# Patient Record
Sex: Female | Born: 1990 | Race: Black or African American | Hispanic: No | Marital: Single | Smoking: Never smoker
Health system: Southern US, Community
[De-identification: ages and names within clinical notes are randomized; demographics above are authoritative.]

## PROBLEM LIST (undated history)

## (undated) ENCOUNTER — Inpatient Hospital Stay (HOSPITAL_COMMUNITY): Payer: Self-pay

## (undated) DIAGNOSIS — F419 Anxiety disorder, unspecified: Secondary | ICD-10-CM

## (undated) DIAGNOSIS — M549 Dorsalgia, unspecified: Secondary | ICD-10-CM

## (undated) DIAGNOSIS — R519 Headache, unspecified: Secondary | ICD-10-CM

## (undated) DIAGNOSIS — N920 Excessive and frequent menstruation with regular cycle: Secondary | ICD-10-CM

## (undated) DIAGNOSIS — D251 Intramural leiomyoma of uterus: Secondary | ICD-10-CM

## (undated) DIAGNOSIS — G8929 Other chronic pain: Secondary | ICD-10-CM

## (undated) DIAGNOSIS — R51 Headache: Secondary | ICD-10-CM

## (undated) DIAGNOSIS — N390 Urinary tract infection, site not specified: Secondary | ICD-10-CM

## (undated) DIAGNOSIS — D649 Anemia, unspecified: Secondary | ICD-10-CM

## (undated) DIAGNOSIS — E559 Vitamin D deficiency, unspecified: Secondary | ICD-10-CM

## (undated) HISTORY — PX: NO PAST SURGERIES: SHX2092

## (undated) HISTORY — DX: Anxiety disorder, unspecified: F41.9

## (undated) HISTORY — DX: Dorsalgia, unspecified: M54.9

---
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
## 2011-09-17 ENCOUNTER — Encounter (HOSPITAL_COMMUNITY): Payer: Self-pay

## 2011-09-17 ENCOUNTER — Emergency Department (INDEPENDENT_AMBULATORY_CARE_PROVIDER_SITE_OTHER)
Admission: EM | Admit: 2011-09-17 | Discharge: 2011-09-17 | Disposition: A | Discharge status: Home / Self Care | Payer: Self-pay | Source: Home / Self Care | Attending: Family Medicine | Admitting: Family Medicine

## 2011-09-17 DIAGNOSIS — B360 Pityriasis versicolor: Secondary | ICD-10-CM

## 2011-09-17 MED ORDER — KETOCONAZOLE 2 % EX GEL: 1.0000 "application " | Freq: Every day | CUTANEOUS | Status: DC

## 2011-09-17 NOTE — Discharge Instructions (Signed)
My impression is that you have a yeast infection in your skin. Use Selsun Blue medicated shampoo red top daily at bedtime let's stay in your skin overnight and rinse the next day. Use the prescribed medication daily as instructed. Followup with the dermatologist if persistent or worsening symptoms despite following treatment.

## 2011-09-17 NOTE — ED Notes (Signed)
C/o general rash for 2-3 months

## 2011-09-20 NOTE — ED Provider Notes (Signed)
History     CSN: [id number]  Arrival date & time 09/17/11  1519   First MD Initiated Contact with Patient 09/17/11 1521      Chief Complaint  Patient presents with  . Rash    (Consider location/radiation/quality/duration/timing/severity/associated sxs/prior treatment) HPI Comments: 21 y/o female otherwise healthy here c/o rash for 2 month. Mildly pruriginous, mostly in back anterior torso and upper extremities but few spots in upper thighs. No fever or chills. Normal appetite. No other symptoms.     History reviewed. No pertinent past medical history.  History reviewed. No pertinent past surgical history.  History reviewed. No pertinent family history.  History  Substance Use Topics  . Smoking status: Not on file  . Smokeless tobacco: Not on file  . Alcohol Use: Not on file    OB History    Grav Para Term Preterm Abortions TAB SAB Ect Mult Living                  Review of Systems  Constitutional: Negative for fever, activity change and appetite change.       10 systems reviewed and  pertinent negative and positive symptoms and as per HPI.     HENT: Negative for mouth sores.   Musculoskeletal: Negative for arthralgias.  Skin: Positive for rash.  All other systems reviewed and are negative.    Allergies  Review of patient's allergies indicates no known allergies.  Home Medications   Current Outpatient Rx  Name Route Sig Dispense Refill  . KETOCONAZOLE 2 % EX GEL Apply externally Apply 1 application topically daily. Apply once daily for 14 days 15 g 1    BP 138/85  Pulse 90  Temp(Src) 98.5 F (36.9 C) (Oral)  Resp 18  SpO2 100%  Physical Exam  Nursing note and vitals reviewed. Constitutional: She is oriented to person, place, and time. She appears well-developed and well-nourished. No distress.  HENT:  Head: Normocephalic and atraumatic.  Mouth/Throat: Oropharynx is clear and moist.  Eyes: Conjunctivae are normal.  Neck: Neck supple.    Cardiovascular: Normal heart sounds.   Pulmonary/Chest: Breath sounds normal.  Lymphadenopathy:    She has no cervical adenopathy.    She has no axillary adenopathy.  Neurological: She is alert and oriented to person, place, and time.  Skin:       Hypopigmented peeling small patches scattered in upper back. Neck and upper arms. Few similar lesions in anterior torso and upper thighs. No erythema or swelling of the skin. No ulcers, scabs or pustules    ED Course  Procedures (including critical care time)  Labs Reviewed - No data to display No results found.   1. Pityriasis versicolor       MDM  Treated with selenium sulfide and ketoconazole gel. Supportive care measures also provided.        Sharin Grave, MD 09/20/11 1113

## 2011-09-22 ENCOUNTER — Telehealth (HOSPITAL_COMMUNITY): Payer: Self-pay | Admitting: Family Medicine

## 2011-09-22 NOTE — ED Notes (Signed)
Patient called stating that she has used all of the prescribed cream and is requesting more.  Attempted to call patient but there was no answer

## 2011-10-07 ENCOUNTER — Encounter (HOSPITAL_COMMUNITY): Payer: Self-pay | Admitting: Emergency Medicine

## 2011-10-07 ENCOUNTER — Emergency Department (HOSPITAL_COMMUNITY)
Admission: EM | Admit: 2011-10-07 | Discharge: 2011-10-08 | Disposition: A | Discharge status: Home / Self Care | Payer: Self-pay | Attending: Emergency Medicine | Admitting: Emergency Medicine

## 2011-10-07 DIAGNOSIS — L409 Psoriasis, unspecified: Secondary | ICD-10-CM

## 2011-10-07 DIAGNOSIS — R21 Rash and other nonspecific skin eruption: Secondary | ICD-10-CM

## 2011-10-07 NOTE — ED Notes (Signed)
Pt alert, nad, c/o rash, onset several months ago, denies recent exposures, resp even unlabored, skin pwd

## 2011-10-08 MED ORDER — CALCIPOTRIENE 0.005 % EX CREA: TOPICAL_CREAM | Freq: Two times a day (BID) | CUTANEOUS | Status: DC

## 2011-10-08 MED ORDER — TRIAMCINOLONE ACETONIDE 0.1 % EX CREA: TOPICAL_CREAM | Freq: Three times a day (TID) | CUTANEOUS | Status: DC

## 2011-10-08 NOTE — ED Provider Notes (Signed)
History     CSN: [id number]  Arrival date & time 10/07/11  2335   First MD Initiated Contact with Patient 10/07/11 2354      Chief Complaint  Patient presents with  . Rash    (Consider location/radiation/quality/duration/timing/severity/associated sxs/prior treatment) HPI Comments: Patient with history of eczema presents emergency department with chief complaint of rash that has been occurring for 3-4 months.  Patient was treated by urgent care for pityriasis versicolor with selenium sulfide and ketoconazole gel.  Patient states her rash has not improved at all and actually believes it to be getting worse.  She denies any fevers, night sweats, chills purulent draining, erythema.  No other concerns at this time.  Patient is a 21 y.o. female presenting with rash. The history is provided by the patient.  Rash     History reviewed. No pertinent past medical history.  History reviewed. No pertinent past surgical history.  No family history on file.  History  Substance Use Topics  . Smoking status: Never Smoker   . Smokeless tobacco: Not on file  . Alcohol Use: No    OB History    Grav Para Term Preterm Abortions TAB SAB Ect Mult Living                  Review of Systems  Constitutional: Negative for fever, chills and appetite change.  HENT: Negative for congestion.   Eyes: Negative for visual disturbance.  Respiratory: Negative for shortness of breath.   Cardiovascular: Negative for chest pain and leg swelling.  Gastrointestinal: Negative for abdominal pain.  Genitourinary: Negative for dysuria, urgency and frequency.  Skin: Positive for rash.  Neurological: Negative for dizziness, syncope, weakness, light-headedness, numbness and headaches.  Psychiatric/Behavioral: Negative for confusion.  All other systems reviewed and are negative.    Allergies  Review of patient's allergies indicates no known allergies.  Home Medications   Current Outpatient Rx  Name Route  Sig Dispense Refill  . KETOCONAZOLE 2 % EX GEL Apply externally Apply 1 application topically daily. Apply once daily for 14 days 15 g 1  . OVER THE COUNTER MEDICATION Topical Apply 1 application topically daily. OTC. Selsum Blue    . TEA TREE OIL OIL Topical Apply 1 application topically daily. Applied to entire body      BP 125/89  Pulse 89  Temp 98 F (36.7 C)  Resp 16  Wt 130 lb (58.968 kg)  SpO2 99%  LMP 10/03/2011  Physical Exam  Nursing note and vitals reviewed. Constitutional: She is oriented to person, place, and time. She appears well-developed and well-nourished. No distress.  HENT:  Head: Normocephalic and atraumatic.  Eyes: Conjunctivae and EOM are normal.  Neck: Normal range of motion.  Pulmonary/Chest: Effort normal.  Musculoskeletal: Normal range of motion.  Neurological: She is alert and oriented to person, place, and time.  Skin: Skin is warm and dry. No rash noted. She is not diaphoretic.       Hyperpigmented skin plaques located on the flexor surfaces groin, breast, and axilla.  Non-scaling smooth inflamed circular lesions located on truncal area anteriorly and posteriorly as well as M. these bilaterally.  Psychiatric: She has a normal mood and affect. Her behavior is normal.    ED Course  Procedures (including critical care time)  Labs Reviewed - No data to display No results found.   No diagnosis found.    MDM  Rash   Patient has failed treatment for pityriasis versicolor.  Question possible psoriasis  with history of eczema.  Discussed thoroughly with patient to stop treatment immediately if rash does not appear to be improving and to followup with dermatologist this week.  Return precautions discussed.  Patient has been hemodynamically stable in no acute distress throughout visit.        Jaci Carrel, [place] 10/08/11 [phone]

## 2011-10-08 NOTE — ED Provider Notes (Signed)
Medical screening examination/treatment/procedure(s) were performed by non-physician practitioner and as supervising physician I was immediately available for consultation/collaboration.  Juandavid Dallman, MD 10/08/11 0914 

## 2012-09-20 ENCOUNTER — Encounter (HOSPITAL_COMMUNITY): Payer: Self-pay | Admitting: Emergency Medicine

## 2012-09-20 ENCOUNTER — Emergency Department (HOSPITAL_COMMUNITY)
Admission: EM | Admit: 2012-09-20 | Discharge: 2012-09-20 | Disposition: A | Discharge status: Home / Self Care | Payer: Self-pay | Attending: Emergency Medicine | Admitting: Emergency Medicine

## 2012-09-20 DIAGNOSIS — Z3202 Encounter for pregnancy test, result negative: Secondary | ICD-10-CM | POA: Insufficient documentation

## 2012-09-20 DIAGNOSIS — R319 Hematuria, unspecified: Secondary | ICD-10-CM | POA: Insufficient documentation

## 2012-09-20 DIAGNOSIS — N390 Urinary tract infection, site not specified: Secondary | ICD-10-CM | POA: Insufficient documentation

## 2012-09-20 LAB — URINALYSIS, ROUTINE W REFLEX MICROSCOPIC
Bilirubin Urine: NEGATIVE
Glucose, UA: NEGATIVE mg/dL
Ketones, ur: NEGATIVE mg/dL
Nitrite: NEGATIVE
Protein, ur: 100 mg/dL — AB
Specific Gravity, Urine: 1.025 (ref 1.005–1.030)
Urobilinogen, UA: 1 mg/dL (ref 0.0–1.0)
pH: 7 (ref 5.0–8.0)

## 2012-09-20 LAB — URINE MICROSCOPIC-ADD ON

## 2012-09-20 LAB — PREGNANCY, URINE: Preg Test, Ur: NEGATIVE

## 2012-09-20 MED ORDER — NITROFURANTOIN MONOHYD MACRO 100 MG PO CAPS: 100.0000 mg | ORAL_CAPSULE | Freq: Two times a day (BID) | ORAL | Status: DC

## 2012-09-20 NOTE — ED Notes (Signed)
Pt c/o painful urination x 1 days. Denies vaginal discharge but states she did she drops of blood.

## 2012-09-20 NOTE — ED Notes (Signed)
Bed:WA08<BR> Expected date:<BR> Expected time:<BR> Means of arrival:<BR> Comments:<BR>

## 2012-09-22 LAB — URINE CULTURE: Colony Count: >=100000

## 2012-09-23 ENCOUNTER — Telehealth (HOSPITAL_COMMUNITY): Payer: Self-pay | Admitting: Emergency Medicine

## 2012-09-23 NOTE — Progress Notes (Signed)
ED Antimicrobial Stewardship Positive Culture Follow Up   Paige Fritz is an 22 y.o. female who presented to [facility] on 09/20/2012 with a chief complaint of  Chief Complaint  Patient presents with  .      painful urination    Recent Results (from the past 720 hour(s))  URINE CULTURE     Status: None   Collection Time    09/20/12  8:50 AM      Result Value Range Status   Specimen Description URINE, CLEAN CATCH   Final   Special Requests NONE   Final   Culture  Setup Time 09/20/2012 13:23   Final   Colony Count >=100,000 COLONIES/ML   Final   Culture PROTEUS MIRABILIS   Final   Report Status 09/22/2012 FINAL   Final   Organism ID, Bacteria PROTEUS MIRABILIS   Final    [x]  Treated with nitrofurantoin, organism resistant to prescribed antimicrobial []  Patient discharged originally without antimicrobial agent and treatment is now indicated  New antibiotic prescription: Bactrim DS 1 tablet BID x 3 days  ED Provider: Magnus Sinning, PA-C   Mickeal Skinner 09/23/2012, 3:57 PM Infectious Diseases Pharmacist Phone# [phone]

## 2012-09-23 NOTE — ED Provider Notes (Signed)
History    22yF with dysuria. Onset yesterday. Today noticed hematuria. No abdominal or back pain. No fever or chills. No n/v. No unusual vaginal bleeding or discharge. No intervention prior to arrival.  CSN: [id number]  Arrival date & time 09/20/12  0716   First MD Initiated Contact with Patient 09/20/12 0740      Chief Complaint  Patient presents with  .      painful urination    (Consider location/radiation/quality/duration/timing/severity/associated sxs/prior treatment) HPI  History reviewed. No pertinent past medical history.  History reviewed. No pertinent past surgical history.  No family history on file.  History  Substance Use Topics  . Smoking status: Never Smoker   . Smokeless tobacco: Not on file  . Alcohol Use: Yes    OB History   Grav Para Term Preterm Abortions TAB SAB Ect Mult Living                  Review of Systems  All systems reviewed and negative, other than as noted in HPI.   Allergies  Review of patient's allergies indicates no known allergies.  Home Medications   Current Outpatient Rx  Name  Route  Sig  Dispense  Refill  . ibuprofen (ADVIL,MOTRIN) 200 MG tablet   Oral   Take 400 mg by mouth every 6 (six) hours as needed for pain.         . nitrofurantoin, macrocrystal-monohydrate, (MACROBID) 100 MG capsule   Oral   Take 1 capsule (100 mg total) by mouth 2 (two) times daily.   10 capsule   0     BP 112/72  Pulse 74  Temp(Src) 98.5 F (36.9 C) (Oral)  Resp 18  SpO2 100%  LMP 09/10/2012  Physical Exam  Nursing note and vitals reviewed. Constitutional: She appears well-developed and well-nourished. No distress.  HENT:  Head: Normocephalic and atraumatic.  Eyes: Conjunctivae are normal. Right eye exhibits no discharge. Left eye exhibits no discharge.  Neck: Neck supple.  Cardiovascular: Normal rate, regular rhythm and normal heart sounds.  Exam reveals no gallop and no friction rub.   No murmur  heard. Pulmonary/Chest: Effort normal and breath sounds normal. No respiratory distress.  Abdominal: Soft. She exhibits no distension. There is no tenderness.  Genitourinary:  No cva tenderness  Musculoskeletal: She exhibits no edema and no tenderness.  Neurological: She is alert.  Skin: Skin is warm and dry.  Psychiatric: She has a normal mood and affect. Her behavior is normal. Thought content normal.    ED Course  Procedures (including critical care time)  Labs Reviewed  URINALYSIS, ROUTINE W REFLEX MICROSCOPIC - Abnormal; Notable for the following:    Color, Urine RED (*)    APPearance TURBID (*)    Hgb urine dipstick LARGE (*)    Protein, ur 100 (*)    Leukocytes, UA MODERATE (*)    All other components within normal limits  URINE MICROSCOPIC-ADD ON - Abnormal; Notable for the following:    Bacteria, UA MANY (*)    All other components within normal limits  URINE CULTURE  PREGNANCY, URINE   No results found.   1. UTI (urinary tract infection)       MDM  22 year old female with dysuria and hematuria. Urinalysis consistent with urinary tract infection. Feel she is appropriate for outpatient treatment. Prescription provided. Return precautions discussed.         Raeford Razor, MD 09/23/12 [phone]

## 2012-09-23 NOTE — ED Notes (Signed)
Post ED Visit - Positive Culture Follow-up: Successful Patient Follow-Up  Culture assessed and recommendations reviewed by: []  Wes Dulaney, Pharm.D., BCPS [x]  Celedonio Miyamoto, Pharm.D., BCPS []  Georgina Pillion, Pharm.D., BCPS []  [employer], [address].D., BCPS, AAHIVP []  Estella Husk, Pharm.D., BCPS, AAHIVP  Positive urine culture  []  Patient discharged without antimicrobial prescription and treatment is now indicated [x]  Organism is resistant to prescribed ED discharge antimicrobial []  Patient with positive blood cultures  Changes discussed with ED provider: Pascal Lux Wingen PA-C New antibiotic prescription: Bactrim DS 1 tablet BID x 3 days. #6Elwanda [place] 09/23/2012, 10:05 AM

## 2012-09-26 ENCOUNTER — Telehealth (HOSPITAL_COMMUNITY): Payer: Self-pay | Admitting: Emergency Medicine

## 2012-09-27 ENCOUNTER — Telehealth (HOSPITAL_COMMUNITY): Payer: Self-pay | Admitting: Emergency Medicine

## 2012-09-27 NOTE — ED Notes (Signed)
Unable to contact patient via phone. Sent letter. °

## 2012-10-01 NOTE — ED Notes (Addendum)
Patient called and stated that she now feels fine.Refused rx.

## 2012-10-28 ENCOUNTER — Emergency Department (HOSPITAL_COMMUNITY)
Admission: EM | Admit: 2012-10-28 | Discharge: 2012-10-28 | Disposition: A | Discharge status: Home / Self Care | Payer: Self-pay | Attending: Emergency Medicine | Admitting: Emergency Medicine

## 2012-10-28 DIAGNOSIS — IMO0001 Reserved for inherently not codable concepts without codable children: Secondary | ICD-10-CM | POA: Insufficient documentation

## 2012-10-28 DIAGNOSIS — M7989 Other specified soft tissue disorders: Secondary | ICD-10-CM | POA: Insufficient documentation

## 2012-10-28 MED ORDER — HYDROCODONE-ACETAMINOPHEN 5-325 MG PO TABS: 2.0000 | ORAL_TABLET | Freq: Four times a day (QID) | ORAL | Status: DC | PRN

## 2012-10-28 MED ORDER — PROMETHAZINE HCL 25 MG PO TABS: 25.0000 mg | ORAL_TABLET | Freq: Four times a day (QID) | ORAL | Status: DC | PRN

## 2012-10-28 MED ORDER — ACETAMINOPHEN 500 MG PO TABS
1000.0000 mg | ORAL_TABLET | Freq: Once | ORAL | Status: AC
  Administered 2012-10-28: 1000 mg via ORAL
  Filled 2012-10-28: qty 2

## 2012-10-28 NOTE — ED Notes (Signed)
Pt states that she has had pain in her L leg behind the knee area x 1 wk and now she has noticed some swelling in her L foot.

## 2012-10-28 NOTE — ED Provider Notes (Signed)
History  This chart was scribed for non-physician practitioner working with Flint Melter, MD by Greggory Stallion, ED scribe. This patient was seen in room WTR7/WTR7 and the patient's care was started at 7:42 PM.  CSN: [id number] Arrival date & time 10/28/12  1934    Chief Complaint  Patient presents with  . Leg Pain    The history is provided by the patient. No language interpreter was used.    HPI Comments: Paige Fritz is a 23 y.o. female who presents to the Emergency Department complaining of left leg pain that started 1 week ago with associated swelling down to her foot that started 2 days ago. Pt states the pain is the worst behind her left knee and on her lower leg. She states this has never happened before. Pt denies trauma or injury to her leg. Pt states the pain is worse when she is standing for long periods of time. 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 Pt states it does not hurt to breathe in. She states she has not taken anything for pain. Pt states she has never had a blood clot in her leg or lungs. No recent trips or surgeries. She is not on birth control.   No past medical history on file. No past surgical history on file. No family history on file. History  Substance Use Topics  . Smoking status: Never Smoker   . Smokeless tobacco: Not on file  . Alcohol Use: Yes   OB History   Grav Para Term Preterm Abortions TAB SAB Ect Mult Living                 Review of Systems  Musculoskeletal: Positive for myalgias.  All other systems reviewed and are negative.    Allergies  Review of patient's allergies indicates no known allergies.  Home Medications   Current Outpatient Rx  Name  Route  Sig  Dispense  Refill  . ibuprofen (ADVIL,MOTRIN) 200 MG tablet   Oral   Take 400 mg by mouth every 6 (six) hours as needed for pain.         . nitrofurantoin, macrocrystal-monohydrate, (MACROBID) 100 MG capsule   Oral   Take 1 capsule (100 mg  total) by mouth 2 (two) times daily.   10 capsule   0    BP 123/106  Pulse 100  Temp(Src) 98.7 F (37.1 C) (Oral)  SpO2 100%  LMP 09/27/2012  Physical Exam  Nursing note and vitals reviewed. Constitutional: She is oriented to person, place, and time. She appears well-developed and well-nourished. No distress.  HENT:  Head: Normocephalic and atraumatic.  Right Ear: External ear normal.  Left Ear: External ear normal.  Nose: Nose normal.  Mouth/Throat: Oropharynx is clear and moist.  Eyes: Conjunctivae are normal.  Neck: Normal range of motion.  Cardiovascular: Normal rate, regular rhythm and normal heart sounds.   Homan's sign positive.  Pulmonary/Chest: Effort normal and breath sounds normal. No stridor. No respiratory distress. She has no wheezes. She has no rales.  Abdominal: Soft. She exhibits no distension.  Musculoskeletal: Normal range of motion.  Tender to palpation over medial aspect of her posterior right knee.   Neurological: She is alert and oriented to person, place, and time. She has normal strength.  Skin: Skin is warm and dry. She is not diaphoretic. No erythema.  Psychiatric: She has a normal mood and affect. Her behavior is normal.    ED Course  Procedures (including critical care time)  DIAGNOSTIC STUDIES: Oxygen Saturation is 100% on RA, normal by my interpretation.    COORDINATION OF CARE: 8:30 PM-Discussed treatment plan with pt at bedside and pt agreed to plan.   Labs Reviewed - No data to display No results found. 1. Leg swelling     MDM  Vascular US tomorrow morning to r/o DVT. My suspicion she has a DVT is very low and I do not feel as though I need to anticoagulate her today. Return instructions given. Vital signs stable for discharge. Patient / Family / Caregiver informed of clinical course, understand medical decision-making process, and agree with plan.        I personally performed the services described in this documentation,  which was scribed in my presence. The recorded information has been reviewed and is accurate.    Mora Bellman, PA-C 10/29/12 1539

## 2012-10-29 ENCOUNTER — Ambulatory Visit (HOSPITAL_COMMUNITY)
Admission: RE | Admit: 2012-10-29 | Discharge: 2012-10-29 | Disposition: A | Discharge status: Home / Self Care | Payer: Self-pay | Source: Ambulatory Visit | Attending: Emergency Medicine | Admitting: Emergency Medicine

## 2012-10-29 ENCOUNTER — Other Ambulatory Visit (HOSPITAL_COMMUNITY): Payer: Self-pay | Admitting: Physician Assistant

## 2012-10-29 DIAGNOSIS — M25562 Pain in left knee: Secondary | ICD-10-CM

## 2012-10-29 DIAGNOSIS — M79609 Pain in unspecified limb: Secondary | ICD-10-CM | POA: Insufficient documentation

## 2012-10-29 DIAGNOSIS — M7989 Other specified soft tissue disorders: Secondary | ICD-10-CM

## 2012-10-29 NOTE — Progress Notes (Signed)
*  Preliminary Results* Left lower extremity venous duplex completed. Left lower extremity is negative for deep vein thrombosis. There is evidence of a left Baker's cyst.  10/29/2012 10:51 AM Gertie Fey, RVT, RDCS, RDMS

## 2012-10-30 NOTE — ED Provider Notes (Signed)
Medical screening examination/treatment/procedure(s) were performed by non-physician practitioner and as supervising physician I was immediately available for consultation/collaboration.  Dao Memmott L Keyri Salberg, MD 10/30/12 0742 

## 2013-05-07 DIAGNOSIS — D500 Iron deficiency anemia secondary to blood loss (chronic): Secondary | ICD-10-CM

## 2013-05-07 HISTORY — DX: Iron deficiency anemia secondary to blood loss (chronic): D50.0

## 2013-07-26 ENCOUNTER — Emergency Department (HOSPITAL_COMMUNITY): Payer: Medicaid Other

## 2013-07-26 ENCOUNTER — Encounter (HOSPITAL_COMMUNITY): Payer: Self-pay | Admitting: Emergency Medicine

## 2013-07-26 ENCOUNTER — Emergency Department (HOSPITAL_COMMUNITY)
Admission: EM | Admit: 2013-07-26 | Discharge: 2013-07-26 | Disposition: A | Discharge status: Home / Self Care | Payer: Medicaid Other | Attending: Emergency Medicine | Admitting: Emergency Medicine

## 2013-07-26 DIAGNOSIS — Z3201 Encounter for pregnancy test, result positive: Secondary | ICD-10-CM | POA: Insufficient documentation

## 2013-07-26 DIAGNOSIS — O2390 Unspecified genitourinary tract infection in pregnancy, unspecified trimester: Secondary | ICD-10-CM | POA: Insufficient documentation

## 2013-07-26 DIAGNOSIS — Z3490 Encounter for supervision of normal pregnancy, unspecified, unspecified trimester: Secondary | ICD-10-CM

## 2013-07-26 DIAGNOSIS — N390 Urinary tract infection, site not specified: Secondary | ICD-10-CM | POA: Insufficient documentation

## 2013-07-26 HISTORY — DX: Urinary tract infection, site not specified: N39.0

## 2013-07-26 LAB — URINALYSIS, ROUTINE W REFLEX MICROSCOPIC
Bilirubin Urine: NEGATIVE
GLUCOSE, UA: NEGATIVE mg/dL
Ketones, ur: NEGATIVE mg/dL
Nitrite: NEGATIVE
PH: 5.5 (ref 5.0–8.0)
PROTEIN: 100 mg/dL — AB
SPECIFIC GRAVITY, URINE: 1.017 (ref 1.005–1.030)
Urobilinogen, UA: 0.2 mg/dL (ref 0.0–1.0)

## 2013-07-26 LAB — URINE MICROSCOPIC-ADD ON

## 2013-07-26 LAB — PREGNANCY, URINE: PREG TEST UR: POSITIVE — AB

## 2013-07-26 LAB — HCG, QUANTITATIVE, PREGNANCY: HCG, BETA CHAIN, QUANT, S: 209 m[IU]/mL — AB (ref <5)

## 2013-07-26 MED ORDER — NITROFURANTOIN MONOHYD MACRO 100 MG PO CAPS: 100.0000 mg | ORAL_CAPSULE | Freq: Two times a day (BID) | ORAL | Status: DC

## 2013-07-26 NOTE — ED Notes (Signed)
Patient transported to Ultrasound 

## 2013-07-26 NOTE — ED Notes (Signed)
Pt c/o lower abdominal pain onset Thursday. Pt reports having diarrhea and having nausea. Pt denies vaginal discharge. Pt reports history of UTI and had similar symptoms.

## 2013-07-26 NOTE — ED Notes (Signed)
MD at bedside. 

## 2013-07-26 NOTE — Discharge Instructions (Signed)
Pregnancy and Urinary Tract Infection °A urinary tract infection (UTI) is a bacterial infection of the urinary tract. Infection of the urinary tract can include the ureters, kidneys (pyelonephritis), bladder (cystitis), and urethra (urethritis). All pregnant women should be screened for bacteria in the urinary tract. Identifying and treating a UTI will decrease the risk of preterm labor and developing more serious infections in both the mother and baby. °CAUSES °Bacteria germs cause almost all UTIs.  °RISK FACTORS °Many factors can increase your chances of getting a UTI during pregnancy. These include: °· Having a short urethra. °· Poor toilet and hygiene habits. °· Sexual intercourse. °· Blockage of urine along the urinary tract. °· Problems with the pelvic muscles or nerves. °· Diabetes. °· Obesity. °· Bladder problems after having several children. °· Previous history of UTI. °SIGNS AND SYMPTOMS  °· Pain, burning, or a stinging feeling when urinating. °· Suddenly feeling the need to urinate right away (urgency). °· Loss of bladder control (urinary incontinence). °· Frequent urination, more than is common with pregnancy. °· Lower abdominal or back discomfort. °· Cloudy urine. °· Blood in the urine (hematuria). °· Fever.  °When the kidneys are infected, the symptoms may be: °· Back pain. °· Flank pain on the right side more so than the left. °· Fever. °· Chills. °· Nausea. °· Vomiting. °DIAGNOSIS  °A urinary tract infection is usually diagnosed through urine tests. Additional tests and procedures are sometimes done. These may include: °· Ultrasound exam of the kidneys, ureters, bladder, and urethra. °· Looking in the bladder with a lighted tube (cystoscopy). °TREATMENT °Typically, UTIs can be treated with antibiotic medicines.  °HOME CARE INSTRUCTIONS  °· Only take over-the-counter or prescription medicines as directed by your health care provider. If you were prescribed antibiotics, take them as directed. Finish  them even if you start to feel better. °· Drink enough fluids to keep your urine clear or pale yellow. °· Do not have sexual intercourse until the infection is gone and your health care provider says it is okay. °· Make sure you are tested for UTIs throughout your pregnancy. These infections often come back.  °Preventing a UTI in the Future °· Practice good toilet habits. Always wipe from front to back. Use the tissue only once. °· Do not hold your urine. Empty your bladder as soon as possible when the urge comes. °· Do not douche or use deodorant sprays. °· Wash with soap and warm water around the genital area and the anus. °· Empty your bladder before and after sexual intercourse. °· Wear underwear with a cotton crotch. °· Avoid caffeine and carbonated drinks. They can irritate the bladder. °· Drink cranberry juice or take cranberry pills. This may decrease the risk of getting a UTI. °· Do not drink alcohol. °· Keep all your appointments and tests as scheduled.  °SEEK MEDICAL CARE IF:  °· Your symptoms get worse. °· You are still having fevers 2 or more days after treatment begins. °· You have a rash. °· You feel that you are having problems with medicines prescribed. °· You have abnormal vaginal discharge. °SEEK IMMEDIATE MEDICAL CARE IF:  °· You have back or flank pain. °· You have chills. °· You have blood in your urine. °· You have nausea and vomiting. °· You have contractions of your uterus. °· You have a gush of fluid from the vagina. °MAKE SURE YOU: °· Understand these instructions.   °· Will watch your condition.   °· Will get help right away if you are not doing   pain.  · You have chills.  · You have blood in your urine.  · You have nausea and vomiting.  · You have contractions of your uterus.  · You have a gush of fluid from the vagina.  MAKE SURE YOU:  · Understand these instructions.    · Will watch your condition.    · Will get help right away if you are not doing well or get worse.    Document Released: 08/18/2010 Document Revised: 02/11/2013 Document Reviewed: 11/20/2012  [employer]® Patient Information ©2014 [employer].

## 2013-07-26 NOTE — ED Provider Notes (Signed)
CSN: [id number]     Arrival date & time 07/26/13  1604 History   First MD Initiated Contact with Patient 07/26/13 1627     Chief Complaint  Patient presents with  . Abdominal Pain     (Consider location/radiation/quality/duration/timing/severity/associated sxs/prior Treatment) HPI Comments: Patient presents with lower abdominal pain. She states she's had a history of UTIs in the past and this feels similar. She's had some discomfort in her suprapubic area for the last 3 days. It's been constant throbbing pain. She noticed a dark color to her urine but hasn't had any burning on urination urinary frequency or hematuria. She denies any back pain. There's no nausea vomiting or fevers. She denies any vaginal bleeding or discharge.  Patient is a 23 y.o. female presenting with abdominal pain.  Abdominal Pain Associated symptoms: no chest pain, no chills, no cough, no diarrhea, no fatigue, no fever, no hematuria, no nausea, no shortness of breath and no vomiting     Past Medical History  Diagnosis Date  . UTI (urinary tract infection)    History reviewed. No pertinent past surgical history. No family history on file. History  Substance Use Topics  . Smoking status: Never Smoker   . Smokeless tobacco: Not on file  . Alcohol Use: Yes   OB History   Grav Para Term Preterm Abortions TAB SAB Ect Mult Living                 Review of Systems  Constitutional: Negative for fever, chills, diaphoresis and fatigue.  HENT: Negative for congestion, rhinorrhea and sneezing.   Eyes: Negative.   Respiratory: Negative for cough, chest tightness and shortness of breath.   Cardiovascular: Negative for chest pain and leg swelling.  Gastrointestinal: Positive for abdominal pain. Negative for nausea, vomiting, diarrhea and blood in stool.  Genitourinary: Negative for frequency, hematuria, flank pain and difficulty urinating.  Musculoskeletal: Negative for arthralgias and back pain.  Skin: Negative for  rash.  Neurological: Negative for dizziness, speech difficulty, weakness, numbness and headaches.      Allergies  Review of patient's allergies indicates no known allergies.  Home Medications   Current Outpatient Rx  Name  Route  Sig  Dispense  Refill  . nitrofurantoin, macrocrystal-monohydrate, (MACROBID) 100 MG capsule   Oral   Take 1 capsule (100 mg total) by mouth 2 (two) times daily. X 7 days   14 capsule   0    BP 139/84  Pulse 118  Temp(Src) 98.1 F (36.7 C) (Oral)  Resp 16  Ht 5\' 1"  (1.549 m)  Wt 145 lb (65.772 kg)  BMI 27.41 kg/m2  SpO2 100%  LMP 07/02/2013 Physical Exam  Constitutional: She is oriented to person, place, and time. She appears well-developed and well-nourished.  HENT:  Head: Normocephalic and atraumatic.  Eyes: Pupils are equal, round, and reactive to light.  Neck: Normal range of motion. Neck supple.  Cardiovascular: Normal rate, regular rhythm and normal heart sounds.   Pulmonary/Chest: Effort normal and breath sounds normal. No respiratory distress. She has no wheezes. She has no rales. She exhibits no tenderness.  Abdominal: Soft. Bowel sounds are normal. There is tenderness (mild suprapubic tenderness). There is no rebound and no guarding.  No CVA tenderness  Musculoskeletal: Normal range of motion. She exhibits no edema.  Lymphadenopathy:    She has no cervical adenopathy.  Neurological: She is alert and oriented to person, place, and time.  Skin: Skin is warm and dry. No rash noted.  Psychiatric:  She has a normal mood and affect.    ED Course  Procedures (including critical care time) Labs Review Results for orders placed during the hospital encounter of 07/26/13  URINALYSIS, ROUTINE W REFLEX MICROSCOPIC      Result Value Ref Range   Color, Urine YELLOW  YELLOW   APPearance CLOUDY (*) CLEAR   Specific Gravity, Urine 1.017  1.005 - 1.030   pH 5.5  5.0 - 8.0   Glucose, UA NEGATIVE  NEGATIVE mg/dL   Hgb urine dipstick LARGE (*)  NEGATIVE   Bilirubin Urine NEGATIVE  NEGATIVE   Ketones, ur NEGATIVE  NEGATIVE mg/dL   Protein, ur 100 (*) NEGATIVE mg/dL   Urobilinogen, UA 0.2  0.0 - 1.0 mg/dL   Nitrite NEGATIVE  NEGATIVE   Leukocytes, UA LARGE (*) NEGATIVE  PREGNANCY, URINE      Result Value Ref Range   Preg Test, Ur POSITIVE (*) NEGATIVE  URINE MICROSCOPIC-ADD ON      Result Value Ref Range   Squamous Epithelial / LPF FEW (*) RARE   WBC, UA TOO NUMEROUS TO COUNT  <3 WBC/hpf   RBC / HPF 7-10  <3 RBC/hpf   Bacteria, UA MANY (*) RARE  HCG, QUANTITATIVE, PREGNANCY      Result Value Ref Range   hCG, Beta Chain, Quant, S 209 (*) <5 mIU/mL   US Ob Transvaginal  07/26/2013   CLINICAL DATA:  Abdominal pain  EXAM: OBSTETRIC <14 WK [place] AND TRANSVAGINAL OB US  TECHNIQUE: Both transabdominal and transvaginal ultrasound examinations were performed for complete evaluation of the gestation as well as the maternal uterus, adnexal regions, and pelvic cul-de-sac. Transvaginal technique was performed to assess early pregnancy.  COMPARISON:  None.  FINDINGS: Intrauterine gestational sac: Not present.  Yolk sac:  None.  Embryo:  None.  Cardiac Activity: None.  Heart Rate:  None.  Maternal uterus/adnexae: The uterus is normal in size and echogenicity. The endometrium measures 17 mm. There are multiple hypoechoic uterine masses with the largest measuring 2.1 x 1.8 x 1.1 cm anteriorly most consistent with uterine fibroids.  The right ovary measures 3.2 x 1.5 x 2.1 cm. There is no right ovarian mass.  The left ovary measures 3.4 x 2.7 x 3.1 cm. There is a 2.2 cm anechoic mass in the left ovary without internal Doppler flow likely representing a cyst.  There is a trace amount of pelvic free fluid.  IMPRESSION: No intrauterine pregnancy is identified. No beta HCG is available at this time, but urine pregnancy was positive. Differential diagnosis includes a pregnancy too early to detect versus missed abortion versus ectopic pregnancy. Recommend clinical  correlation, serial quantitative beta HCGs, ectopic precautions, and followup ultrasound as clinically indicated.   Electronically Signed   By: Kathreen Devoid   On: 07/26/2013 19:32     Imaging Review US Ob Transvaginal  07/26/2013   CLINICAL DATA:  Abdominal pain  EXAM: OBSTETRIC <14 WK [place] AND TRANSVAGINAL OB US  TECHNIQUE: Both transabdominal and transvaginal ultrasound examinations were performed for complete evaluation of the gestation as well as the maternal uterus, adnexal regions, and pelvic cul-de-sac. Transvaginal technique was performed to assess early pregnancy.  COMPARISON:  None.  FINDINGS: Intrauterine gestational sac: Not present.  Yolk sac:  None.  Embryo:  None.  Cardiac Activity: None.  Heart Rate:  None.  Maternal uterus/adnexae: The uterus is normal in size and echogenicity. The endometrium measures 17 mm. There are multiple hypoechoic uterine masses with the largest measuring 2.1  x 1.8 x 1.1 cm anteriorly most consistent with uterine fibroids.  The right ovary measures 3.2 x 1.5 x 2.1 cm. There is no right ovarian mass.  The left ovary measures 3.4 x 2.7 x 3.1 cm. There is a 2.2 cm anechoic mass in the left ovary without internal Doppler flow likely representing a cyst.  There is a trace amount of pelvic free fluid.  IMPRESSION: No intrauterine pregnancy is identified. No beta HCG is available at this time, but urine pregnancy was positive. Differential diagnosis includes a pregnancy too early to detect versus missed abortion versus ectopic pregnancy. Recommend clinical correlation, serial quantitative beta HCGs, ectopic precautions, and followup ultrasound as clinically indicated.   Electronically Signed   By: Kathreen Devoid   On: 07/26/2013 19:32     EKG Interpretation None      MDM   Final diagnoses:  Pregnancy  UTI (lower urinary tract infection)    Patient presents with lower abdominal pain. Is primarily suprapubic that she did have some mild left lower quadrant tenderness.  I feel this is likely related to her urinary tract infection. However given that she had a positive pregnancy test I did do a pelvic ultrasound which did not show evidence of intrauterine pregnancy. Her Quant hCG was very low and that is likely the reason no intrauterine pregnancy was not visualized. I did give her instructions to followup at the [facility] hospital in 2 days for a repeat Quant. I gave her prescription for Macrobid for UTI and urine culture was sent. I gave her strict return precautions to return to the [facility] hospital if she has worsening pain, heavy vaginal bleeding or other worsening symptoms.    Malvin Johns, MD 07/26/13 2042

## 2013-07-28 ENCOUNTER — Other Ambulatory Visit: Payer: Medicaid Other

## 2013-07-28 DIAGNOSIS — E349 Endocrine disorder, unspecified: Secondary | ICD-10-CM

## 2013-07-28 LAB — URINE CULTURE: Colony Count: >=100000

## 2013-07-29 ENCOUNTER — Telehealth: Payer: Self-pay | Admitting: General Practice

## 2013-07-29 ENCOUNTER — Telehealth (HOSPITAL_COMMUNITY): Payer: Self-pay | Admitting: *Deleted

## 2013-07-29 DIAGNOSIS — Z3490 Encounter for supervision of normal pregnancy, unspecified, unspecified trimester: Secondary | ICD-10-CM

## 2013-07-29 LAB — HCG, QUANTITATIVE, PREGNANCY: hCG, Beta Chain, Quant, S: 426.4 m[IU]/mL

## 2013-07-29 NOTE — ED Notes (Addendum)
+   Urine Plan: Amoxicillin 250 mg po TID x 7 days written by Domenic Moras.

## 2013-07-29 NOTE — Progress Notes (Signed)
ED Antimicrobial Stewardship Positive Culture Follow Up   Paige Fritz is an 23 y.o. female who presented to [facility] on 07/26/2013 with a chief complaint of  Chief Complaint  Patient presents with  . Abdominal Pain    Recent Results (from the past 720 hour(s))  URINE CULTURE     Status: None   Collection Time    07/26/13  4:28 PM      Result Value Ref Range Status   Specimen Description URINE, CLEAN CATCH   Final   Special Requests ADD [id number]   Final   Culture  Setup Time     Final   Value: 07/27/2013 02:17     Performed at [facility]     Final   Value: >=100,000 COLONIES/ML     Performed at [facility]   Culture     Final   Value: PROTEUS MIRABILIS     Performed at [facility]   Report Status 07/28/2013 FINAL   Final   Organism ID, Bacteria PROTEUS MIRABILIS   Final    [x]  Treated with MacroBID, organism resistant to prescribed antimicrobial []  Patient discharged originally without antimicrobial agent and treatment is now indicated  New antibiotic prescription: amoxicillin 250mg  PO TID x 7 days  ED Provider: Domenic Moras, PA-C   [name] 07/29/2013, 9:15 AM Infectious Diseases Pharmacist Phone# [phone]

## 2013-07-29 NOTE — Telephone Encounter (Signed)
Patient called and left message stating she would like her test results from yesterday. Discussed results with dr Olevia Bowens who would like patient to have repeat ultrasound in 1-2 weeks. Called patient and informed her of lab results and need for ultrasound, appt on 4/2 @ 1130. Patient verbalized understanding to all and had no further questions

## 2013-07-29 NOTE — ED Notes (Signed)
Unable to contact via phone.'Letter sent to [facility] address. 

## 2013-08-06 ENCOUNTER — Ambulatory Visit (HOSPITAL_COMMUNITY)
Admission: RE | Admit: 2013-08-06 | Discharge: 2013-08-06 | Disposition: A | Discharge status: Home / Self Care | Payer: Medicaid Other | Source: Ambulatory Visit | Attending: Family Medicine | Admitting: Family Medicine

## 2013-08-06 DIAGNOSIS — O3680X0 Pregnancy with inconclusive fetal viability, not applicable or unspecified: Secondary | ICD-10-CM | POA: Diagnosis not present

## 2013-08-06 DIAGNOSIS — O99891 Other specified diseases and conditions complicating pregnancy: Secondary | ICD-10-CM | POA: Diagnosis not present

## 2013-08-06 DIAGNOSIS — N831 Corpus luteum cyst of ovary, unspecified side: Secondary | ICD-10-CM | POA: Diagnosis not present

## 2013-08-06 DIAGNOSIS — Z3490 Encounter for supervision of normal pregnancy, unspecified, unspecified trimester: Secondary | ICD-10-CM

## 2013-08-06 DIAGNOSIS — R1032 Left lower quadrant pain: Secondary | ICD-10-CM | POA: Insufficient documentation

## 2013-08-06 DIAGNOSIS — O34599 Maternal care for other abnormalities of gravid uterus, unspecified trimester: Secondary | ICD-10-CM | POA: Diagnosis not present

## 2013-08-06 DIAGNOSIS — O9989 Other specified diseases and conditions complicating pregnancy, childbirth and the puerperium: Principal | ICD-10-CM

## 2013-08-12 ENCOUNTER — Telehealth: Payer: Self-pay | Admitting: *Deleted

## 2013-08-12 NOTE — Telephone Encounter (Signed)
Pt called nurse line concerning bleeding for the past 4-5 days.  Attempted to call patient with no answer, left message to return call but mailbox full.

## 2013-08-13 ENCOUNTER — Inpatient Hospital Stay (HOSPITAL_COMMUNITY): Payer: Medicaid Other

## 2013-08-13 ENCOUNTER — Encounter (HOSPITAL_COMMUNITY): Payer: Self-pay | Admitting: *Deleted

## 2013-08-13 ENCOUNTER — Inpatient Hospital Stay (HOSPITAL_COMMUNITY)
Admission: AD | Admit: 2013-08-13 | Discharge: 2013-08-13 | Disposition: A | Discharge status: Home / Self Care | Payer: Medicaid Other | Source: Ambulatory Visit | Attending: Obstetrics & Gynecology | Admitting: Obstetrics & Gynecology

## 2013-08-13 DIAGNOSIS — O039 Complete or unspecified spontaneous abortion without complication: Secondary | ICD-10-CM

## 2013-08-13 DIAGNOSIS — R109 Unspecified abdominal pain: Secondary | ICD-10-CM | POA: Insufficient documentation

## 2013-08-13 MED ORDER — PROMETHAZINE HCL 12.5 MG PO TABS: 12.5000 mg | ORAL_TABLET | Freq: Four times a day (QID) | ORAL | Status: DC | PRN

## 2013-08-13 MED ORDER — IBUPROFEN 600 MG PO TABS: 600.0000 mg | ORAL_TABLET | Freq: Four times a day (QID) | ORAL | Status: DC | PRN

## 2013-08-13 MED ORDER — OXYCODONE-ACETAMINOPHEN 5-325 MG PO TABS: 1.0000 | ORAL_TABLET | Freq: Four times a day (QID) | ORAL | Status: DC | PRN

## 2013-08-13 NOTE — MAU Note (Signed)
Pt LMP 07/04/2013, bhcg on 3/22 and 3/24, having cramping and bleeding, passing blood clots tonight.

## 2013-08-13 NOTE — Discharge Instructions (Signed)
Incomplete Miscarriage A miscarriage is the sudden loss of an unborn baby (fetus) before the 20th week of pregnancy. In an incomplete miscarriage, parts of the fetus or placenta (afterbirth) remain in the body.  Having a miscarriage can be an emotional experience. Talk with your health care provider about any questions you may have about miscarrying, the grieving process, and your future pregnancy plans. CAUSES   Problems with the fetal chromosomes that make it impossible for the baby to develop normally. Problems with the baby's genes or chromosomes are most often the result of errors that occur by chance as the embryo divides and grows. The problems are not inherited from the parents.  Infection of the cervix or uterus.  Hormone problems.  Problems with the cervix, such as having an incompetent cervix. This is when the tissue in the cervix is not strong enough to hold the pregnancy.  Problems with the uterus, such as an abnormally shaped uterus, uterine fibroids, or congenital abnormalities.  Certain medical conditions.  Smoking, drinking alcohol, or taking illegal drugs.  Trauma. SYMPTOMS   Vaginal bleeding or spotting, with or without cramps or pain.  Pain or cramping in the abdomen or lower back.  Passing fluid, tissue, or blood clots from the vagina. DIAGNOSIS  Your health care provider will perform a physical exam. You may also have an ultrasound to confirm the miscarriage. Blood or urine tests may also be ordered. TREATMENT   Usually, a dilation and curettage (D&C) procedure is performed. During a D&C procedure, the cervix is widened (dilated) and any remaining fetal or placental tissue is gently removed from the uterus.  Antibiotic medicines are prescribed if there is an infection. Other medicines may be given to reduce the size of the uterus (contract) if there is a lot of bleeding.  If you have Rh negative blood and your baby was Rh positive, you will need an Rho(D)  immune globulin shot. This shot will protect any future baby from having Rh blood problems in future pregnancies.  You may be confined to bed rest. This means you should stay in bed and only get up to use the bathroom. HOME CARE INSTRUCTIONS   Rest as directed by your health care provider.  Restrict activity as directed by your health care provider. You may be allowed to continue light activity if curettage was not done but you require further treatment.  Keep track of the number of pads you use each day. Keep track of how soaked (saturated) they are. Record this information.  Do not  use tampons.  Do not douche or have sexual intercourse until approved by your health care provider.  Keep all follow-up appointments for re-evaluation and continuing management.  Only take over-the-counter or prescription medicines for pain, fever, or discomfort as directed by your health care provider.  Take antibiotic medicine as directed by your health care provider. Make sure you finish it even if you start to feel better. SEEK IMMEDIATE MEDICAL CARE IF:   You experience severe cramps in your stomach, back, or abdomen.  You have an unexplained temperature (make sure to record these temperatures).  You pass large clots or tissue (save these for your health care provider to inspect).  Your bleeding increases.  You become light-headed, weak, or have fainting episodes. MAKE SURE YOU:   Understand these instructions.  Will watch your condition.  Will get help right away if you are not doing well or get worse. Document Released: 04/23/2005 Document Revised: 02/11/2013 Document Reviewed: 11/20/2012  [employer] Patient Information 2014 [employer].

## 2013-08-13 NOTE — MAU Provider Note (Signed)
History     CSN: [id number]  Arrival date and time: 08/13/13 2426   First Provider Initiated Contact with Patient 08/13/13 [phone]      Chief Complaint  Patient presents with  . Abdominal Cramping  . Vaginal Bleeding   HPI Paige Fritz is a 23 y.o. G1P0 at Unknown who presents to MAU today with complaint of lower abdominal pain and vaginal bleeding. The patient was first seen in late March. +UPT and US showed nothing measurable in the uterus. Follow-up labs were appropriate and a second [place] was performed on 08/06/13 which showed a IUGS without YS or embryo. She states that tonight her bleeding became heavy with clots. Her pain was severe, but has improved since she "passed the clots." she denies fever. She has mild nausea without vomiting. This was not a desired pregnancy and patient was strongly considering termination.   OB History   Grav Para Term Preterm Abortions TAB SAB Ect Mult Living   1               Past Medical History  Diagnosis Date  . UTI (urinary tract infection)     History reviewed. No pertinent past surgical history.  History reviewed. No pertinent family history.  History  Substance Use Topics  . Smoking status: Never Smoker   . Smokeless tobacco: Not on file  . Alcohol Use: Yes    Allergies: No Known Allergies  Prescriptions prior to admission  Medication Sig Dispense Refill  . nitrofurantoin, macrocrystal-monohydrate, (MACROBID) 100 MG capsule Take 1 capsule (100 mg total) by mouth 2 (two) times daily. X 7 days  14 capsule  0    Review of Systems  Constitutional: Negative for fever and malaise/fatigue.  Gastrointestinal: Positive for nausea and abdominal pain. Negative for vomiting, diarrhea and constipation.  Genitourinary:       + vaginal bleeding  Neurological: Negative for dizziness and weakness.   Physical Exam   Blood pressure 141/83, pulse 103, temperature 98.6 F (37 C), temperature source Oral, resp. rate 16, height 5\' 1"   (1.549 m), weight 70.67 kg (155 lb 12.8 oz), last menstrual period 07/02/2013.  Physical Exam  Constitutional: She is oriented to person, place, and time. She appears well-developed and well-nourished. No distress.  HENT:  Head: Normocephalic and atraumatic.  Cardiovascular: Normal rate.   Respiratory: Effort normal.  GI: Soft. She exhibits no distension and no mass. There is no tenderness. There is no rebound and no guarding.  Genitourinary:  Scant bleeding noted on pad  Neurological: She is alert and oriented to person, place, and time.  Skin: Skin is warm and dry. No erythema.  Psychiatric: She has a normal mood and affect.   US Ob Transvaginal  08/13/2013   CLINICAL DATA:  Vaginal bleeding. Estimated gestational age [redacted] weeks 0 days per LMP.  EXAM: TRANSVAGINAL OB ULTRASOUND  TECHNIQUE: Transvaginal ultrasound was performed for complete evaluation of the gestation as well as the maternal uterus, adnexal regions, and pelvic cul-de-sac.  COMPARISON:  None.  FINDINGS: Intrauterine gestational sac: Not visualized.  Yolk sac:  Not visualized.  Embryo:  Not visualized.  Cardiac Activity: Not visualized.  Heart Rate: Not visualized. Bpm  Maternal uterus/adnexae: 2 small intramural leiomyomas are present with the larger measuring 1.2 cm over the left side of the uterine body. Right ovary is within normal. Left ovary demonstrates a 2.1 cm cystic structure possibly corpus luteal cyst. There is no significant free fluid.  IMPRESSION: No evidence of intrauterine pregnancy.  No focal adnexal abnormality to suggest ectopic pregnancy. Recommend correlation with the patient's quantitative beta HCG.   Electronically Signed   By: Marin Olp M.D.   On: 08/13/2013 01:58    MAU Course  Procedures None  MDM Given patients recent follow-up. [place] ordered  Assessment and Plan  A: SAB  P: Discharge home Rx for Percocet, Ibuprofen and Phenergan given to patient Bleeding precautions discussed Patient referred  to [facility] for follow-up in ~2 weeks. They will call her with an appointment Patient may return to MAU as needed or if her condition were to change or worsen  Farris Has 08/13/2013, 3:23 AM

## 2013-08-13 NOTE — Telephone Encounter (Signed)
Contacted patient, pt reports she came MAU and she has miscarried. Pt is concerned about work, states she has note for today.  Pt verbalizes understanding.

## 2013-08-14 ENCOUNTER — Telehealth: Payer: Self-pay | Admitting: General Practice

## 2013-08-14 NOTE — Telephone Encounter (Signed)
Patient called and left message stating she recently discussed with [place] earlier about her miscarriage and she missed a day of work today (4/9) and was told to call us back if her pain increases and it has and she is thinking she will need to be out of work again. Called patient back and she stated she is feeling better but is still sore and does have some pain still and doesn't think she will be able to work today. Per chart review, patient was in MAU yesterday. Told patient we would not be able to provide her with a note to work since we haven't seen her but if she starts to feel worse she can go back to MAU. Patient verbalized understanding and had no further questions

## 2013-08-30 ENCOUNTER — Encounter (HOSPITAL_COMMUNITY): Payer: Self-pay | Admitting: *Deleted

## 2013-08-30 ENCOUNTER — Inpatient Hospital Stay (HOSPITAL_COMMUNITY)
Admission: AD | Admit: 2013-08-30 | Discharge: 2013-08-30 | Disposition: A | Discharge status: Home / Self Care | Payer: Medicaid Other | Source: Ambulatory Visit | Attending: Obstetrics & Gynecology | Admitting: Obstetrics & Gynecology

## 2013-08-30 DIAGNOSIS — N760 Acute vaginitis: Secondary | ICD-10-CM | POA: Insufficient documentation

## 2013-08-30 DIAGNOSIS — A499 Bacterial infection, unspecified: Secondary | ICD-10-CM | POA: Diagnosis not present

## 2013-08-30 DIAGNOSIS — B9689 Other specified bacterial agents as the cause of diseases classified elsewhere: Secondary | ICD-10-CM | POA: Insufficient documentation

## 2013-08-30 DIAGNOSIS — N898 Other specified noninflammatory disorders of vagina: Secondary | ICD-10-CM | POA: Diagnosis present

## 2013-08-30 HISTORY — DX: Headache: R51

## 2013-08-30 LAB — URINALYSIS, ROUTINE W REFLEX MICROSCOPIC
Bilirubin Urine: NEGATIVE
Glucose, UA: NEGATIVE mg/dL
Hgb urine dipstick: NEGATIVE
Ketones, ur: NEGATIVE mg/dL
Leukocytes, UA: NEGATIVE
Nitrite: NEGATIVE
Protein, ur: NEGATIVE mg/dL
Specific Gravity, Urine: 1.015 (ref 1.005–1.030)
Urobilinogen, UA: 0.2 mg/dL (ref 0.0–1.0)
pH: 8.5 — ABNORMAL HIGH (ref 5.0–8.0)

## 2013-08-30 LAB — WET PREP, GENITAL
Trich, Wet Prep: NONE SEEN
YEAST WET PREP: NONE SEEN

## 2013-08-30 LAB — POCT PREGNANCY, URINE: Preg Test, Ur: NEGATIVE

## 2013-08-30 MED ORDER — METRONIDAZOLE 500 MG PO TABS: 500.0000 mg | ORAL_TABLET | Freq: Two times a day (BID) | ORAL | Status: DC

## 2013-08-30 NOTE — MAU Provider Note (Signed)
History     CSN: [id number]  Arrival date and time: 08/30/13 2440   First Provider Initiated Contact with Patient 08/30/13 1704      No chief complaint on file.  HPI Comments: Paige Fritz 23 y.o. G2P0010 presents to MAU with vaginal discharge and odor. She is sexually active with new partner in last 3 months and the are not using protection. Last intercourse was Wednesday the 15th. She has an appointment this week for birth control in [facility]. She had SAB 08/13/13.     Past Medical History  Diagnosis Date  . UTI (urinary tract infection)   . Headache(784.0)     History reviewed. No pertinent past surgical history.  History reviewed. No pertinent family history.  History  Substance Use Topics  . Smoking status: Never Smoker   . Smokeless tobacco: Not on file  . Alcohol Use: Yes     Comment: once a month    Allergies: No Known Allergies  Prescriptions prior to admission  Medication Sig Dispense Refill  . ibuprofen (ADVIL,MOTRIN) 600 MG tablet Take 1 tablet (600 mg total) by mouth every 6 (six) hours as needed.  30 tablet  0  . oxyCODONE-acetaminophen (PERCOCET/ROXICET) 5-325 MG per tablet Take 1-2 tablets by mouth every 6 (six) hours as needed for severe pain.  12 tablet  0  . promethazine (PHENERGAN) 12.5 MG tablet Take 1 tablet (12.5 mg total) by mouth every 6 (six) hours as needed for nausea or vomiting.  30 tablet  0    Review of Systems  Constitutional: Negative.   HENT: Negative.   Eyes: Negative.   Respiratory: Negative.   Cardiovascular: Negative.   Gastrointestinal: Negative.   Genitourinary:       Vaginal discharge with odor  Musculoskeletal: Negative.   Skin: Negative.   Neurological: Negative.   Psychiatric/Behavioral: Negative.    Physical Exam   Blood pressure 146/88, pulse 98, temperature 98.1 F (36.7 C), temperature source Oral, resp. rate 16, last menstrual period 07/02/2013, unknown if currently breastfeeding.  Physical  Exam  Constitutional: She is oriented to person, place, and time. She appears well-developed and well-nourished. She appears distressed.  HENT:  Head: Normocephalic and atraumatic.  Genitourinary:  Genital:External: Clitoris ring Vaginal:Small amount clear thin discharge Cervix:Closed/ no CMT Bimanual:Nontender   Musculoskeletal: Normal range of motion.  Neurological: She is alert and oriented to person, place, and time.  Skin: Skin is warm and dry.  Psychiatric: She has a normal mood and affect. Her behavior is normal. Judgment and thought content normal.   Results for orders placed during the hospital encounter of 08/30/13 (from the past 24 hour(s))  URINALYSIS, ROUTINE W REFLEX MICROSCOPIC     Status: Abnormal   Collection Time    08/30/13  4:30 PM      Result Value Ref Range   Color, Urine YELLOW  YELLOW   APPearance CLEAR  CLEAR   Specific Gravity, Urine 1.015  1.005 - 1.030   pH 8.5 (*) 5.0 - 8.0   Glucose, UA NEGATIVE  NEGATIVE mg/dL   Hgb urine dipstick NEGATIVE  NEGATIVE   Bilirubin Urine NEGATIVE  NEGATIVE   Ketones, ur NEGATIVE  NEGATIVE mg/dL   Protein, ur NEGATIVE  NEGATIVE mg/dL   Urobilinogen, UA 0.2  0.0 - 1.0 mg/dL   Nitrite NEGATIVE  NEGATIVE   Leukocytes, UA NEGATIVE  NEGATIVE  POCT PREGNANCY, URINE     Status: None   Collection Time    08/30/13  4:47 PM  Result Value Ref Range   Preg Test, Ur NEGATIVE  NEGATIVE  WET PREP, GENITAL     Status: Abnormal   Collection Time    08/30/13  5:10 PM      Result Value Ref Range   Yeast Wet Prep HPF POC NONE SEEN  NONE SEEN   Trich, Wet Prep NONE SEEN  NONE SEEN   Clue Cells Wet Prep HPF POC MANY (*) NONE SEEN   WBC, Wet Prep HPF POC FEW (*) NONE SEEN     MAU Course  Procedures  MDM  Wet prep/ GC/Chlamydia  Assessment and Plan   A: Bacterial vaginosis  P: Flagyl 500 mg PO BID X 7 days No alcohol or unprotected intercourse x 7 days Keep follow up appointment with Clinic for birth control  Paige Fritz 08/30/2013, 5:34 PM

## 2013-08-30 NOTE — Discharge Instructions (Signed)

## 2013-08-30 NOTE — MAU Note (Signed)
Pt had a miscarriage on April 9th; was concerned because she developed some new creamy white/ yellow discharge with an odor that is different for her.  She has a follow up appt this Thursday in the clinic but was not comfortable waiting til then.

## 2013-08-31 ENCOUNTER — Telehealth: Payer: Self-pay | Admitting: *Deleted

## 2013-08-31 NOTE — Telephone Encounter (Signed)
Paige Fritz called and left a message that she was seen yesterday at [facility] and was prescribed a medicine and the pharmacy was closed last night , and she lost the prescription.  Per chart review was eprescribed to pharmacy on file ( [facility]). Called Paige Fritz and informed her was sent to [facility]. She states she wants it sent to [facility] instead. Informed her since we have already sent it to a pharmacy , I cannot change it, but she can call [facility] and change it to [facility] of her choice. Paige Fritz voices understanding.

## 2013-09-01 LAB — GC/CHLAMYDIA PROBE AMP
CT PROBE, AMP APTIMA: NEGATIVE
GC Probe RNA: NEGATIVE

## 2013-09-02 ENCOUNTER — Encounter: Payer: Self-pay | Admitting: Obstetrics & Gynecology

## 2013-09-02 ENCOUNTER — Telehealth: Payer: Self-pay | Admitting: *Deleted

## 2013-09-02 NOTE — Telephone Encounter (Signed)
Paige Fritz missed her appointment today to follow up miscarriage. Called [name] and she reports she thought it was tomorrow. Wants to reschedule- will have front office call her with new appt.

## 2013-10-15 ENCOUNTER — Ambulatory Visit (INDEPENDENT_AMBULATORY_CARE_PROVIDER_SITE_OTHER): Payer: Medicaid Other | Admitting: Medical

## 2013-10-15 ENCOUNTER — Encounter: Payer: Self-pay | Admitting: Obstetrics & Gynecology

## 2013-10-15 VITALS — BP 129/83 | HR 88 | Temp 98.2°F | Ht 61.0 in | Wt 165.8 lb

## 2013-10-15 DIAGNOSIS — IMO0001 Reserved for inherently not codable concepts without codable children: Secondary | ICD-10-CM

## 2013-10-15 DIAGNOSIS — Z309 Encounter for contraceptive management, unspecified: Secondary | ICD-10-CM

## 2013-10-15 LAB — POCT PREGNANCY, URINE
PREG TEST UR: NEGATIVE
PREG TEST UR: NEGATIVE

## 2013-10-15 MED ORDER — NORGESTIMATE-ETH ESTRADIOL 0.25-35 MG-MCG PO TABS: 1.0000 | ORAL_TABLET | Freq: Every day | ORAL | Status: DC

## 2013-10-15 NOTE — Patient Instructions (Signed)
Oral Contraception Information Oral contraceptive pills (OCPs) are medicines taken to prevent pregnancy. OCPs work by preventing the ovaries from releasing eggs. The hormones in OCPs also cause the cervical mucus to thicken, preventing the sperm from entering the uterus. The hormones also cause the uterine lining to become thin, not allowing a fertilized egg to attach to the inside of the uterus. OCPs are highly effective when taken exactly as prescribed. However, OCPs do not prevent sexually transmitted diseases (STDs). Safe sex practices, such as using condoms along with the pill, can help prevent STDs.  Before taking the pill, you may have a physical exam and Pap test. Your health care provider may order blood tests. The health care provider will make sure you are a good candidate for oral contraception. Discuss with your health care provider the possible side effects of the OCP you may be prescribed. When starting an OCP, it can take 2 to 3 months for the body to adjust to the changes in hormone levels in your body.  TYPES OF ORAL CONTRACEPTION  The combination pill This pill contains estrogen and progestin (synthetic progesterone) hormones. The combination pill comes in 21-day, 28-day, or 91-day packs. Some types of combination pills are meant to be taken continuously (365-day pills). With 21-day packs, you do not take pills for 7 days after the last pill. With 28-day packs, the pill is taken every day. The last 7 pills are without hormones. Certain types of pills have more than 21 hormone-containing pills. With 91-day packs, the first 84 pills contain both hormones, and the last 7 pills contain no hormones or contain estrogen only.  The minipill This pill contains the progesterone hormone only. The pill is taken every day continuously. It is very important to take the pill at the same time each day. The minipill comes in packs of 28 pills. All 28 pills contain the hormone.  ADVANTAGES OF ORAL  CONTRACEPTIVE PILLS  Decreases premenstrual symptoms.   Treats menstrual period cramps.   Regulates the menstrual cycle.   Decreases a heavy menstrual flow.   May treatacne, depending on the type of pill.   Treats abnormal uterine bleeding.   Treats polycystic ovarian syndrome.   Treats endometriosis.   Can be used as emergency contraception.  THINGS THAT CAN MAKE ORAL CONTRACEPTIVE PILLS LESS EFFECTIVE OCPs can be less effective if:   You forget to take the pill at the same time every day.   You have a stomach or intestinal disease that lessens the absorption of the pill.   You take OCPs with other medicines that make OCPs less effective, such as antibiotics, certain HIV medicines, and some seizure medicines.   You take expired OCPs.   You forget to restart the pill on day 7, when using the packs of 21 pills.  RISKS ASSOCIATED WITH ORAL CONTRACEPTIVE PILLS  Oral contraceptive pills can sometimes cause side effects, such as:  Headache.  Nausea.  Breast tenderness.  Irregular bleeding or spotting. Combination pills are also associated with a small increased risk of:  Blood clots.  Heart attack.  Stroke. Document Released: 07/14/2002 Document Revised: 02/11/2013 Document Reviewed: 10/12/2012 [facility] Patient Information 2014 [place], [place].

## 2013-10-15 NOTE — Progress Notes (Signed)
Has had 20 lb weight gain since March.

## 2013-10-15 NOTE — Progress Notes (Signed)
Patient ID: Paige Fritz, female   DOB: 16-Oct-1990, 23 y.o.   MRN: [id number]  History:  [name] is a 23 y.o. G2P0010 who presents to clinic today for follow-up after SAB on 08/30/13. Patient has resumed having regular periods. She states that she bleeds x 7 days and bleeding is heavier than prior to SAB. She denies pain today, but does endorse occasional dysmenorrhea. She is interested in birth control today. She denies sexual intercourse x 1 month.   The following portions of the patient's history were reviewed and updated as appropriate: allergies, current medications, past family history, past medical history, past social history, past surgical history and problem list.  Review of Systems:  Pertinent items are noted in HPI.  Objective:  Physical Exam BP 129/83  Pulse 88  Temp(Src) 98.2 F (36.8 C) (Oral)  Ht 5\' 1"  (1.549 m)  Wt 165 lb 12.8 oz (75.206 kg)  BMI 31.34 kg/m2  LMP 10/08/2013  Breastfeeding? No GENERAL: Well-developed, well-nourished female in no acute distress.  HEENT: Normocephalic, atraumatic.  LUNGS: Normal rate. Clear to auscultation bilaterally.  HEART: Regular rate and rhythm with no adventitious sounds.  ABDOMEN: Soft, nontender, nondistended. No organomegaly. PELVIC: deferred EXTREMITIES: No cyanosis, clubbing, or edema  Labs and Imaging UPT - negative  MDM Discussed options for birth control including OCPs, Nuvaring, Nexplanon and Mirena Patient prefers OCPs at this time  Assessment & Plan:  Assessment: S/P SAB on 08/30/13 Birth control counseling  Plans: Rx for Sprintec sent to patient's pharmacy Patient advised to call [facility] if she decides to change or a LARC and make an appointment Patient will follow-up in 1 year for annual exam and refill on OCPs or sooner PRN  Farris Has, PA-C 10/15/2013 1:43 PM

## 2013-11-25 ENCOUNTER — Inpatient Hospital Stay (HOSPITAL_COMMUNITY)
Admission: AD | Admit: 2013-11-25 | Discharge: 2013-11-25 | Discharge status: Against Medical Advice | Payer: Medicaid Other | Source: Ambulatory Visit | Attending: Obstetrics & Gynecology | Admitting: Obstetrics & Gynecology

## 2013-11-25 DIAGNOSIS — Z3200 Encounter for pregnancy test, result unknown: Secondary | ICD-10-CM | POA: Diagnosis present

## 2013-11-25 DIAGNOSIS — Z3202 Encounter for pregnancy test, result negative: Secondary | ICD-10-CM | POA: Insufficient documentation

## 2013-11-25 LAB — URINALYSIS, ROUTINE W REFLEX MICROSCOPIC
BILIRUBIN URINE: NEGATIVE
Glucose, UA: NEGATIVE mg/dL
KETONES UR: NEGATIVE mg/dL
NITRITE: NEGATIVE
PH: 7 (ref 5.0–8.0)
PROTEIN: NEGATIVE mg/dL
Specific Gravity, Urine: <1.005 — ABNORMAL LOW (ref 1.005–1.030)
UROBILINOGEN UA: 0.2 mg/dL (ref 0.0–1.0)

## 2013-11-25 LAB — POCT PREGNANCY, URINE: Preg Test, Ur: NEGATIVE

## 2013-11-25 LAB — URINE MICROSCOPIC-ADD ON

## 2013-11-25 NOTE — MAU Note (Signed)
Patient states she has been using green tea as a diet supplement. States her urine was dark and had a vaginal discharge.

## 2013-11-25 NOTE — MAU Note (Signed)
Patient came in with a friend. States she has to leave to get grandmothers car back. Only wanted the result of the pregnancy test.

## 2013-12-02 ENCOUNTER — Inpatient Hospital Stay (HOSPITAL_COMMUNITY)
Admission: AD | Admit: 2013-12-02 | Discharge: 2013-12-02 | Discharge status: Against Medical Advice | Payer: Medicaid Other | Source: Ambulatory Visit | Attending: Obstetrics and Gynecology | Admitting: Obstetrics and Gynecology

## 2013-12-02 DIAGNOSIS — N898 Other specified noninflammatory disorders of vagina: Secondary | ICD-10-CM | POA: Diagnosis not present

## 2013-12-02 NOTE — MAU Note (Signed)
Not in lobby

## 2013-12-02 NOTE — MAU Note (Signed)
Same thing as last time, spotting, discharge.  Want to make sure everything is ok. Was here a wk ago.  Left without eval.

## 2013-12-06 ENCOUNTER — Encounter (HOSPITAL_COMMUNITY): Payer: Self-pay | Admitting: *Deleted

## 2013-12-06 ENCOUNTER — Inpatient Hospital Stay (HOSPITAL_COMMUNITY)
Admission: AD | Admit: 2013-12-06 | Discharge: 2013-12-06 | Disposition: A | Discharge status: Home / Self Care | Payer: Medicaid Other | Source: Ambulatory Visit | Attending: Obstetrics and Gynecology | Admitting: Obstetrics and Gynecology

## 2013-12-06 DIAGNOSIS — N760 Acute vaginitis: Secondary | ICD-10-CM | POA: Diagnosis not present

## 2013-12-06 DIAGNOSIS — A499 Bacterial infection, unspecified: Secondary | ICD-10-CM | POA: Insufficient documentation

## 2013-12-06 DIAGNOSIS — B9689 Other specified bacterial agents as the cause of diseases classified elsewhere: Secondary | ICD-10-CM | POA: Diagnosis not present

## 2013-12-06 DIAGNOSIS — N898 Other specified noninflammatory disorders of vagina: Secondary | ICD-10-CM | POA: Diagnosis present

## 2013-12-06 LAB — URINALYSIS, ROUTINE W REFLEX MICROSCOPIC
Bilirubin Urine: NEGATIVE
GLUCOSE, UA: NEGATIVE mg/dL
Hgb urine dipstick: NEGATIVE
Ketones, ur: NEGATIVE mg/dL
Leukocytes, UA: NEGATIVE
Nitrite: NEGATIVE
PH: 5.5 (ref 5.0–8.0)
Protein, ur: NEGATIVE mg/dL
Urobilinogen, UA: 0.2 mg/dL (ref 0.0–1.0)

## 2013-12-06 LAB — WET PREP, GENITAL
Trich, Wet Prep: NONE SEEN
Yeast Wet Prep HPF POC: NONE SEEN

## 2013-12-06 LAB — POCT PREGNANCY, URINE: PREG TEST UR: NEGATIVE

## 2013-12-06 MED ORDER — METRONIDAZOLE 500 MG PO TABS: 500.0000 mg | ORAL_TABLET | Freq: Two times a day (BID) | ORAL | Status: DC

## 2013-12-06 NOTE — MAU Provider Note (Signed)
History     CSN: [id number]  Arrival date and time: 12/06/13 1107   First Provider Initiated Contact with Patient 12/06/13 1149      Chief Complaint  Patient presents with  . Vaginal Discharge   HPI Paige Fritz 23 y.o.  Comes to MAU today as she has been having a yellow vaginal discharge for a couple of weeks and wonders if it is BV.  Treated for yeast a few weeks ago.  Is not having any itching or odor.  Is currently on birth control pills  and is taking the pill daily.  Sometimes uses condoms.  OB History   Grav Para Term Preterm Abortions TAB SAB Ect Mult Living   1    1  1    0      Past Medical History  Diagnosis Date  . UTI (urinary tract infection)   . JMEQASTM(196.2)     Past Surgical History  Procedure Laterality Date  . No past surgeries      History reviewed. No pertinent family history.  History  Substance Use Topics  . Smoking status: Never Smoker   . Smokeless tobacco: Never Used  . Alcohol Use: Yes     Comment: once a month    Allergies: No Known Allergies  Prescriptions prior to admission  Medication Sig Dispense Refill  . diphenhydrAMINE (BENADRYL) 25 MG tablet Take 25 mg by mouth every 6 (six) hours as needed for allergies.      . norgestimate-ethinyl estradiol (ORTHO-CYCLEN,SPRINTEC,PREVIFEM) 0.25-35 MG-MCG tablet Take 1 tablet by mouth daily.  1 Package  11    Review of Systems  Constitutional: Negative for fever.  Gastrointestinal: Negative for nausea, vomiting and abdominal pain.  Genitourinary:       Vaginal discharge. No vaginal bleeding. No dysuria.   Physical Exam   Blood pressure 143/78, pulse 94, temperature 98.3 F (36.8 C), temperature source Oral, resp. rate 16, height 5' 1.25" (1.556 m), weight 160 lb 2 oz (72.632 kg), last menstrual period 11/06/2013.  Physical Exam  Nursing note and vitals reviewed. Constitutional: She is oriented to person, place, and time. She appears well-developed and well-nourished. No  distress.  HENT:  Head: Normocephalic.  Eyes: EOM are normal.  Neck: Neck supple.  GI: Soft. There is no tenderness.  Genitourinary:  Speculum exam: Vagina - small amount of pale yellow discharge, no bleeding Cervix - No contact bleeding Bimanual exam: Cervix closed Uterus non tender, normal size Adnexa non tender, no masses bilaterally GC/Chlam, wet prep done Chaperone present for exam.  Musculoskeletal: Normal range of motion.  Neurological: She is alert and oriented to person, place, and time.  Skin: Skin is warm and dry.  Psychiatric: She has a normal mood and affect.    MAU Course  Procedures  MDM Results for orders placed during the hospital encounter of 12/06/13 (from the past 24 hour(s))  URINALYSIS, ROUTINE W REFLEX MICROSCOPIC     Status: Abnormal   Collection Time    12/06/13 11:20 AM      Result Value Ref Range   Color, Urine YELLOW  YELLOW   APPearance CLEAR  CLEAR   Specific Gravity, Urine >1.030 (*) 1.005 - 1.030   pH 5.5  5.0 - 8.0   Glucose, UA NEGATIVE  NEGATIVE mg/dL   Hgb urine dipstick NEGATIVE  NEGATIVE   Bilirubin Urine NEGATIVE  NEGATIVE   Ketones, ur NEGATIVE  NEGATIVE mg/dL   Protein, ur NEGATIVE  NEGATIVE mg/dL   Urobilinogen, UA 0.2  0.0 - 1.0 mg/dL   Nitrite NEGATIVE  NEGATIVE   Leukocytes, UA NEGATIVE  NEGATIVE  POCT PREGNANCY, URINE     Status: None   Collection Time    12/06/13 11:38 AM      Result Value Ref Range   Preg Test, Ur NEGATIVE  NEGATIVE  WET PREP, GENITAL     Status: Abnormal   Collection Time    12/06/13 11:55 AM      Result Value Ref Range   Yeast Wet Prep HPF POC NONE SEEN  NONE SEEN   Trich, Wet Prep NONE SEEN  NONE SEEN   Clue Cells Wet Prep HPF POC MODERATE (*) NONE SEEN   WBC, Wet Prep HPF POC FEW (*) NONE SEEN     Assessment and Plan  Bacterial vaginosis  Plan Will treat with metronidazole as client has had recurring symptoms Establish care with a medical provider that can help you with your medical  problems. Paige Fritz 12/06/2013, 11:59 AM

## 2013-12-06 NOTE — MAU Provider Note (Signed)
Attestation of Attending Supervision of Advanced Practitioner (CNM/NP): Evaluation and management procedures were performed by the Advanced Practitioner under my supervision and collaboration.  I have reviewed the Advanced Practitioner's note and chart, and I agree with the management and plan.  Karter Hellmer 12/06/2013 2:47 PM

## 2013-12-06 NOTE — MAU Note (Signed)
Patient presents with complaint of vaginal discharge X 2 weeks.

## 2013-12-06 NOTE — Discharge Instructions (Signed)
Establish care with a medical provider that can help you with your medical problems. Get your prescription and take as directed.

## 2013-12-07 LAB — GC/CHLAMYDIA PROBE AMP
CT PROBE, AMP APTIMA: NEGATIVE
GC Probe RNA: NEGATIVE

## 2014-03-08 ENCOUNTER — Encounter (HOSPITAL_COMMUNITY): Payer: Self-pay | Admitting: *Deleted

## 2014-03-09 ENCOUNTER — Encounter (HOSPITAL_COMMUNITY): Payer: Self-pay | Admitting: *Deleted

## 2014-03-09 ENCOUNTER — Inpatient Hospital Stay (HOSPITAL_COMMUNITY)
Admission: AD | Admit: 2014-03-09 | Discharge: 2014-03-09 | Disposition: A | Discharge status: Home / Self Care | Payer: Medicaid Other | Source: Ambulatory Visit | Attending: Obstetrics & Gynecology | Admitting: Obstetrics & Gynecology

## 2014-03-09 DIAGNOSIS — N760 Acute vaginitis: Secondary | ICD-10-CM | POA: Insufficient documentation

## 2014-03-09 DIAGNOSIS — N898 Other specified noninflammatory disorders of vagina: Secondary | ICD-10-CM | POA: Diagnosis present

## 2014-03-09 DIAGNOSIS — A499 Bacterial infection, unspecified: Secondary | ICD-10-CM

## 2014-03-09 DIAGNOSIS — B9689 Other specified bacterial agents as the cause of diseases classified elsewhere: Secondary | ICD-10-CM | POA: Diagnosis not present

## 2014-03-09 LAB — URINALYSIS, ROUTINE W REFLEX MICROSCOPIC
Bilirubin Urine: NEGATIVE
GLUCOSE, UA: NEGATIVE mg/dL
HGB URINE DIPSTICK: NEGATIVE
Ketones, ur: NEGATIVE mg/dL
Leukocytes, UA: NEGATIVE
Nitrite: NEGATIVE
PROTEIN: NEGATIVE mg/dL
Specific Gravity, Urine: 1.015 (ref 1.005–1.030)
Urobilinogen, UA: 0.2 mg/dL (ref 0.0–1.0)
pH: 7.5 (ref 5.0–8.0)

## 2014-03-09 LAB — WET PREP, GENITAL
Trich, Wet Prep: NONE SEEN
YEAST WET PREP: NONE SEEN

## 2014-03-09 LAB — POCT PREGNANCY, URINE: PREG TEST UR: NEGATIVE

## 2014-03-09 MED ORDER — METRONIDAZOLE 500 MG PO TABS: 500.0000 mg | ORAL_TABLET | Freq: Two times a day (BID) | ORAL | Status: AC

## 2014-03-09 NOTE — MAU Provider Note (Signed)
CC: Abdominal Pain and Vaginal Discharge    First Provider Initiated Contact with Patient 03/09/14 1535      HPI Paige Fritz is a 23 y.o. G1P0010who presents with onset of Increase thin vaginal discharge 3 days ago with malodor noted today. No irritation or itch. Denies dysuria, hematuria, frequency or urgency of urination. LMP 02/06/14. Denies abnormal bleeding. Last. intercourse 1 week ago. No new sexual. OCPs from [place]. Was not able to get timely apt there or HD or PMD.  Past Medical History  Diagnosis Date  . UTI (urinary tract infection)   . Headache(784.0)     OB History  Gravida Para Term Preterm AB SAB TAB Ectopic Multiple Living  1    1 1     0    # Outcome Date GA Lbr Len/2nd Weight Sex Delivery Anes PTL Lv  1 SAB               Past Surgical History  Procedure Laterality Date  . No past surgeries    . Appendectomy      History   Social History  . Marital Status: Single    Spouse Name: N/A    Number of Children: N/A  . Years of Education: N/A   Occupational History  . Not on file.   Social History Main Topics  . Smoking status: Never Smoker   . Smokeless tobacco: Never Used  . Alcohol Use: Yes     Comment: once a month  . Drug Use: No  . Sexual Activity: Yes    Birth Control/ Protection: Pill   Other Topics Concern  . Not on file   Social History Narrative    No current facility-administered medications on file prior to encounter.   Current Outpatient Prescriptions on File Prior to Encounter  Medication Sig Dispense Refill  . norgestimate-ethinyl estradiol (ORTHO-CYCLEN,SPRINTEC,PREVIFEM) 0.25-35 MG-MCG tablet Take 1 tablet by mouth daily. 1 Package 11  . diphenhydrAMINE (BENADRYL) 25 MG tablet Take 25 mg by mouth every 6 (six) hours as needed for allergies.    . metroNIDAZOLE (FLAGYL) 500 MG tablet Take 1 tablet (500 mg total) by mouth 2 (two) times daily. No alcohol while taking this medication (Patient not taking: Reported on 03/09/2014)  14 tablet 0    No Known Allergies  ROS Pertinent items in HPI  PHYSICAL EXAM Filed Vitals:   03/09/14 1458  BP: 137/65  Pulse: 95  Temp: 98.9 F (37.2 C)  Resp: 16   General: Well nourished, well developed female in no acute distress Cardiovascular: Normal rate Respiratory: Normal effort Abdomen: Soft, nontender Back: No CVAT Extremities: No edema Neurologic: Alert and oriented Speculum exam: NEFG; vagina with thin grayish discharge, no blood; cervix clean Bimanual exam: cervix closed, no CMT; uterus NSSP; no adnexal tenderness or masses   LAB RESULTS Results for orders placed or performed during the hospital encounter of 03/09/14 (from the past 24 hour(s))  Pregnancy, urine POC     Status: None   Collection Time: 03/09/14  3:31 PM  Result Value Ref Range   Preg Test, Ur NEGATIVE NEGATIVE  Wet prep, genital     Status: Abnormal   Collection Time: 03/09/14  3:43 PM  Result Value Ref Range   Yeast Wet Prep HPF POC NONE SEEN NONE SEEN   Trich, Wet Prep NONE SEEN NONE SEEN   Clue Cells Wet Prep HPF POC FEW (A) NONE SEEN   WBC, Wet Prep HPF POC MODERATE (A) NONE SEEN  IMAGING No results found.  MAU COURSE   ASSESSMENT  1. BV (bacterial vaginosis)     PLAN Discharge home. See AVS for patient education.    Medication List    TAKE these medications        diphenhydrAMINE 25 MG tablet  Commonly known as:  BENADRYL  Take 25 mg by mouth every 6 (six) hours as needed for allergies.     metroNIDAZOLE 500 MG tablet  Commonly known as:  FLAGYL  Take 1 tablet (500 mg total) by mouth 2 (two) times daily.     norgestimate-ethinyl estradiol 0.25-35 MG-MCG tablet  Commonly known as:  ORTHO-CYCLEN,SPRINTEC,PREVIFEM  Take 1 tablet by mouth daily.        Follow-up Information    Follow up with [facility].   Why:  If symptoms worsen   Contact information:   [place] [address] [phone]       Lorene Dy,  CNM 03/09/2014 4:11 PM

## 2014-03-09 NOTE — Progress Notes (Signed)
Pt left, had to get to work by [employer]

## 2014-03-09 NOTE — MAU Note (Signed)
Pt states she is having irregular discharge and is having some odor.

## 2014-03-09 NOTE — MAU Note (Signed)
Patient states she has been having lower abdominal pain for about one week. Has a vaginal discharge with odor for a couple of days. Denies bleeding, nausea, vomiting or diarrhea.

## 2014-03-10 LAB — GC/CHLAMYDIA PROBE AMP
CT PROBE, AMP APTIMA: NEGATIVE
GC Probe RNA: NEGATIVE

## 2014-03-10 LAB — HIV ANTIBODY (ROUTINE TESTING W REFLEX): HIV: NONREACTIVE

## 2014-05-06 ENCOUNTER — Inpatient Hospital Stay (HOSPITAL_COMMUNITY)
Admission: AD | Admit: 2014-05-06 | Discharge: 2014-05-06 | Disposition: A | Discharge status: Home / Self Care | Payer: Medicaid Other | Source: Ambulatory Visit | Attending: Obstetrics & Gynecology | Admitting: Obstetrics & Gynecology

## 2014-05-06 ENCOUNTER — Encounter (HOSPITAL_COMMUNITY): Payer: Self-pay

## 2014-05-06 DIAGNOSIS — N898 Other specified noninflammatory disorders of vagina: Secondary | ICD-10-CM | POA: Diagnosis present

## 2014-05-06 DIAGNOSIS — B9689 Other specified bacterial agents as the cause of diseases classified elsewhere: Secondary | ICD-10-CM | POA: Diagnosis not present

## 2014-05-06 DIAGNOSIS — N760 Acute vaginitis: Secondary | ICD-10-CM | POA: Insufficient documentation

## 2014-05-06 DIAGNOSIS — A499 Bacterial infection, unspecified: Secondary | ICD-10-CM

## 2014-05-06 LAB — URINALYSIS, ROUTINE W REFLEX MICROSCOPIC
BILIRUBIN URINE: NEGATIVE
GLUCOSE, UA: NEGATIVE mg/dL
HGB URINE DIPSTICK: NEGATIVE
KETONES UR: NEGATIVE mg/dL
Leukocytes, UA: NEGATIVE
Nitrite: NEGATIVE
PROTEIN: NEGATIVE mg/dL
Specific Gravity, Urine: >1.03 — ABNORMAL HIGH (ref 1.005–1.030)
Urobilinogen, UA: 0.2 mg/dL (ref 0.0–1.0)
pH: 6 (ref 5.0–8.0)

## 2014-05-06 LAB — WET PREP, GENITAL
Trich, Wet Prep: NONE SEEN
Yeast Wet Prep HPF POC: NONE SEEN

## 2014-05-06 LAB — POCT PREGNANCY, URINE: Preg Test, Ur: NEGATIVE

## 2014-05-06 LAB — HIV ANTIBODY (ROUTINE TESTING W REFLEX): HIV 1&2 Ab, 4th Generation: NONREACTIVE

## 2014-05-06 MED ORDER — METRONIDAZOLE 500 MG PO TABS: 500.0000 mg | ORAL_TABLET | Freq: Two times a day (BID) | ORAL | Status: DC

## 2014-05-06 MED ORDER — METRONIDAZOLE 0.75 % VA GEL: 1.0000 | Freq: Every day | VAGINAL | Status: DC

## 2014-05-06 NOTE — Discharge Instructions (Signed)
Bacterial Vaginosis Bacterial vaginosis is a vaginal infection that occurs when the normal balance of bacteria in the vagina is disrupted. It results from an overgrowth of certain bacteria. This is the most common vaginal infection in women of childbearing age. Treatment is important to prevent complications, especially in pregnant women, as it can cause a premature delivery. CAUSES  Bacterial vaginosis is caused by an increase in harmful bacteria that are normally present in smaller amounts in the vagina. Several different kinds of bacteria can cause bacterial vaginosis. However, the reason that the condition develops is not fully understood. RISK FACTORS Certain activities or behaviors can put you at an increased risk of developing bacterial vaginosis, including:  Having a new sex partner or multiple sex partners.  Douching.  Using an intrauterine device (IUD) for contraception. Women do not get bacterial vaginosis from toilet seats, bedding, swimming pools, or contact with objects around them. SIGNS AND SYMPTOMS  Some women with bacterial vaginosis have no signs or symptoms. Common symptoms include:  Grey vaginal discharge.  A fishlike odor with discharge, especially after sexual intercourse.  Itching or burning of the vagina and vulva.  Burning or pain with urination. DIAGNOSIS  Your health care provider will take a medical history and examine the vagina for signs of bacterial vaginosis. A sample of vaginal fluid may be taken. Your health care provider will look at this sample under a microscope to check for bacteria and abnormal cells. A vaginal pH test may also be done.  TREATMENT  Bacterial vaginosis may be treated with antibiotic medicines. These may be given in the form of a pill or a vaginal cream. A second round of antibiotics may be prescribed if the condition comes back after treatment.  HOME CARE INSTRUCTIONS   Only take over-the-counter or prescription medicines as  directed by your health care provider.  If antibiotic medicine was prescribed, take it as directed. Make sure you finish it even if you start to feel better.  Do not have sex until treatment is completed.  Tell all sexual partners that you have a vaginal infection. They should see their health care provider and be treated if they have problems, such as a mild rash or itching.  Practice safe sex by using condoms and only having one sex partner. SEEK MEDICAL CARE IF:   Your symptoms are not improving after 3 days of treatment.  You have increased discharge or pain.  You have a fever. MAKE SURE YOU:   Understand these instructions.  Will watch your condition.  Will get help right away if you are not doing well or get worse. FOR MORE INFORMATION  Centers for Disease Control and Prevention, Division of STD Prevention: [employer]raiser[employer]raud[employer]fi American Sexual Health Association (ASHA): www.ashastd.org  Document Released: 04/23/2005 Document Revised: 02/11/2013 Document Reviewed: 12/03/2012 [facility] Patient Information 2015 [place], [place]. This information is not intended to replace advice given to you by your health care provider. Make sure you discuss any questions you have with your health care provider.  Instructions: Soap - Dove for sensitive skin Lotion - Aveeno Pads - NOT always Pads should be unscented Take 1 tablet Probiotic (over-the-counter) daily before breakfast

## 2014-05-06 NOTE — MAU Note (Signed)
Side pain tonight, states has symptoms she gets when she has BV. Denies vaginal bleeding. Denies vaginal discharge.

## 2014-05-06 NOTE — MAU Provider Note (Signed)
History     CSN: [id number]  Arrival date and time: 05/06/14 6568   First Provider Initiated Contact with Patient 05/06/14 0448      Chief Complaint  Patient presents with  . Vaginal Discharge   HPI [name] is a 23 y.o. G1P0010 who presents to MAU today with complaint of possible BV infection. She states occasional lower abdominal pain and vaginal discharge. She denies vaginal bleeding or UTI symptoms. Patient is sexually active and does not use condoms. She has had BV twice in the last 6 months, once in August and once in November.   OB History    Gravida Para Term Preterm AB TAB SAB Ectopic Multiple Living   1    1  1    0      Past Medical History  Diagnosis Date  . UTI (urinary tract infection)   . LEXNTZGY(174.9)     Past Surgical History  Procedure Laterality Date  . No past surgeries      Family History  Problem Relation Age of Onset  . Hypertension Father     History  Substance Use Topics  . Smoking status: Never Smoker   . Smokeless tobacco: Never Used  . Alcohol Use: Yes     Comment: once a month    Allergies: No Known Allergies  No prescriptions prior to admission    Review of Systems  Constitutional: Negative for fever and malaise/fatigue.  Gastrointestinal: Positive for abdominal pain.  Genitourinary: Negative for dysuria, urgency and frequency.       + vaginal discharge Neg - vaginal bleeding   Physical Exam   Blood pressure 142/77, pulse 91, resp. rate 18, height 5\' 3"  (1.6 m), weight 164 lb (74.39 kg).  Physical Exam  Constitutional: She is oriented to person, place, and time. She appears well-developed and well-nourished. No distress.  HENT:  Head: Normocephalic.  Cardiovascular: Normal rate.   Respiratory: Effort normal.  GI: Soft. She exhibits no distension and no mass. There is no tenderness. There is no rebound and no guarding.  Genitourinary: Uterus is not enlarged and not tender. Cervix exhibits no motion  tenderness, no discharge and no friability. Right adnexum displays no mass and no tenderness. Left adnexum displays no mass and no tenderness. No bleeding in the vagina. Vaginal discharge (moderate amount of thin, white discharge noted) found.  Neurological: She is alert and oriented to person, place, and time.  Skin: Skin is warm and dry. No erythema.   Results for orders placed or performed during the hospital encounter of 05/06/14 (from the past 24 hour(s))  Urinalysis, Routine w reflex microscopic     Status: Abnormal   Collection Time: 05/06/14  4:30 AM  Result Value Ref Range   Color, Urine YELLOW YELLOW   APPearance CLEAR CLEAR   Specific Gravity, Urine >1.030 (H) 1.005 - 1.030   pH 6.0 5.0 - 8.0   Glucose, UA NEGATIVE NEGATIVE mg/dL   Hgb urine dipstick NEGATIVE NEGATIVE   Bilirubin Urine NEGATIVE NEGATIVE   Ketones, ur NEGATIVE NEGATIVE mg/dL   Protein, ur NEGATIVE NEGATIVE mg/dL   Urobilinogen, UA 0.2 0.0 - 1.0 mg/dL   Nitrite NEGATIVE NEGATIVE   Leukocytes, UA NEGATIVE NEGATIVE  Pregnancy, urine POC     Status: None   Collection Time: 05/06/14  4:40 AM  Result Value Ref Range   Preg Test, Ur NEGATIVE NEGATIVE  Wet prep, genital     Status: Abnormal   Collection Time: 05/06/14  4:47 AM  Result Value Ref Range   Yeast Wet Prep HPF POC NONE SEEN NONE SEEN   Trich, Wet Prep NONE SEEN NONE SEEN   Clue Cells Wet Prep HPF POC FEW (A) NONE SEEN   WBC, Wet Prep HPF POC MODERATE (A) NONE SEEN    MAU Course  Procedures None  MDM UPT - negative UA, wet prep, GC/Chlamydia and HIV today  Assessment and Plan  A: Bacterial vaginosis  P: Discharge home Rx for Flagyl and Metrogel given Patient referred back to [facility] for birth control counseling Patient may return to MAU as needed or if her condition were to change or worsen   Luvenia Redden, PA-C  05/06/2014, 5:16 AM

## 2014-05-09 LAB — GC/CHLAMYDIA PROBE AMP
CT Probe RNA: NEGATIVE
GC Probe RNA: NEGATIVE

## 2014-06-21 ENCOUNTER — Encounter (HOSPITAL_COMMUNITY): Payer: Self-pay

## 2014-06-21 ENCOUNTER — Emergency Department (HOSPITAL_COMMUNITY): Payer: Medicaid Other

## 2014-06-21 ENCOUNTER — Emergency Department (HOSPITAL_COMMUNITY)
Admission: EM | Admit: 2014-06-21 | Discharge: 2014-06-22 | Disposition: A | Discharge status: Home / Self Care | Payer: Medicaid Other | Attending: Emergency Medicine | Admitting: Emergency Medicine

## 2014-06-21 DIAGNOSIS — R1031 Right lower quadrant pain: Secondary | ICD-10-CM

## 2014-06-21 DIAGNOSIS — R197 Diarrhea, unspecified: Secondary | ICD-10-CM | POA: Diagnosis present

## 2014-06-21 DIAGNOSIS — Z87440 Personal history of urinary (tract) infections: Secondary | ICD-10-CM | POA: Diagnosis not present

## 2014-06-21 DIAGNOSIS — Z3202 Encounter for pregnancy test, result negative: Secondary | ICD-10-CM | POA: Diagnosis not present

## 2014-06-21 DIAGNOSIS — R6883 Chills (without fever): Secondary | ICD-10-CM | POA: Diagnosis not present

## 2014-06-21 DIAGNOSIS — Z792 Long term (current) use of antibiotics: Secondary | ICD-10-CM | POA: Diagnosis not present

## 2014-06-21 DIAGNOSIS — M545 Low back pain: Secondary | ICD-10-CM | POA: Insufficient documentation

## 2014-06-21 DIAGNOSIS — Z793 Long term (current) use of hormonal contraceptives: Secondary | ICD-10-CM | POA: Diagnosis not present

## 2014-06-21 LAB — PREGNANCY, URINE: Preg Test, Ur: NEGATIVE

## 2014-06-21 LAB — CBC WITH DIFFERENTIAL/PLATELET
BASOS ABS: 0 10*3/uL (ref 0.0–0.1)
BASOS PCT: 0 % (ref 0–1)
EOS ABS: 0.1 10*3/uL (ref 0.0–0.7)
EOS PCT: 1 % (ref 0–5)
HCT: 37.5 % (ref 36.0–46.0)
Hemoglobin: 12.5 g/dL (ref 12.0–15.0)
LYMPHS PCT: 9 % — AB (ref 12–46)
Lymphs Abs: 0.6 10*3/uL — ABNORMAL LOW (ref 0.7–4.0)
MCH: 29.7 pg (ref 26.0–34.0)
MCHC: 33.3 g/dL (ref 30.0–36.0)
MCV: 89.1 fL (ref 78.0–100.0)
Monocytes Absolute: 0.4 10*3/uL (ref 0.1–1.0)
Monocytes Relative: 7 % (ref 3–12)
Neutro Abs: 5.6 10*3/uL (ref 1.7–7.7)
Neutrophils Relative %: 83 % — ABNORMAL HIGH (ref 43–77)
Platelets: 353 10*3/uL (ref 150–400)
RBC: 4.21 MIL/uL (ref 3.87–5.11)
RDW: 12.7 % (ref 11.5–15.5)
WBC: 6.7 10*3/uL (ref 4.0–10.5)

## 2014-06-21 LAB — COMPREHENSIVE METABOLIC PANEL
ALT: 26 U/L (ref 0–35)
ANION GAP: 5 (ref 5–15)
AST: 23 U/L (ref 0–37)
Albumin: 4 g/dL (ref 3.5–5.2)
Alkaline Phosphatase: 67 U/L (ref 39–117)
BUN: 7 mg/dL (ref 6–23)
CHLORIDE: 110 mmol/L (ref 96–112)
CO2: 24 mmol/L (ref 19–32)
Calcium: 9.3 mg/dL (ref 8.4–10.5)
Creatinine, Ser: 0.5 mg/dL (ref 0.50–1.10)
GFR calc Af Amer: >90 mL/min (ref >90)
GFR calc non Af Amer: >90 mL/min (ref >90)
Glucose, Bld: 96 mg/dL (ref 70–99)
POTASSIUM: 4.4 mmol/L (ref 3.5–5.1)
SODIUM: 139 mmol/L (ref 135–145)
Total Bilirubin: 0.4 mg/dL (ref 0.3–1.2)
Total Protein: 7.5 g/dL (ref 6.0–8.3)

## 2014-06-21 LAB — URINALYSIS, ROUTINE W REFLEX MICROSCOPIC
Bilirubin Urine: NEGATIVE
GLUCOSE, UA: NEGATIVE mg/dL
HGB URINE DIPSTICK: NEGATIVE
Ketones, ur: NEGATIVE mg/dL
Nitrite: NEGATIVE
PROTEIN: NEGATIVE mg/dL
Specific Gravity, Urine: 1.016 (ref 1.005–1.030)
Urobilinogen, UA: 1 mg/dL (ref 0.0–1.0)
pH: 8.5 — ABNORMAL HIGH (ref 5.0–8.0)

## 2014-06-21 LAB — URINE MICROSCOPIC-ADD ON

## 2014-06-21 MED ORDER — DICYCLOMINE HCL 20 MG PO TABS: 20.0000 mg | ORAL_TABLET | Freq: Two times a day (BID) | ORAL | Status: DC

## 2014-06-21 MED ORDER — SODIUM CHLORIDE 0.9 % IV BOLUS (SEPSIS)
1000.0000 mL | Freq: Once | INTRAVENOUS | Status: AC
  Administered 2014-06-21: 1000 mL via INTRAVENOUS

## 2014-06-21 MED ORDER — IOHEXOL 300 MG/ML  SOLN
100.0000 mL | Freq: Once | INTRAMUSCULAR | Status: AC | PRN
  Administered 2014-06-21: 100 mL via INTRAVENOUS

## 2014-06-21 MED ORDER — DICYCLOMINE HCL 10 MG PO CAPS
20.0000 mg | ORAL_CAPSULE | Freq: Once | ORAL | Status: AC
  Administered 2014-06-21: 20 mg via ORAL
  Filled 2014-06-21: qty 2

## 2014-06-21 NOTE — ED Provider Notes (Signed)
CSN: [id number]     Arrival date & time 06/21/14  1853 History   First MD Initiated Contact with Patient 06/21/14 2014     Chief Complaint  Patient presents with  . Abdominal Pain  . Diarrhea   Paige Fritz is a 24 y.o. female who is otherwise healthy presents to emergency department complaining of abdominal pain and diarrhea for the past 6 hours. The patient reports she ate pizza earlier today then started having abdominal pain and diarrhea. She also reports chills. She rates her pain as 6 out of 10 worst on her right lower side that radiates to her bilateral low back. Her pain worsens when she feels the need to have a BM and then will resolve after a BM for a short period. She reports 6 episodes of diarrhea today she reports pain is cramping. Patient reports her last cycle was 2/82016. Patient denies fevers, vomiting, nausea, vaginal discharge, vaginal bleeding, lightheadedness, dysuria, hematuria, urinary frequency, urinary urgency, hematochezia, or rashes. She denies previous abdominal surgeries. She denies recent antibiotic use.  (Consider location/radiation/quality/duration/timing/severity/associated sxs/prior Treatment) HPI  Past Medical History  Diagnosis Date  . UTI (urinary tract infection)   . [id number](170.0)    Past Surgical History  Procedure Laterality Date  . No past surgeries     Family History  Problem Relation Age of Onset  . Hypertension Father    History  Substance Use Topics  . Smoking status: Never Smoker   . Smokeless tobacco: Never Used  . Alcohol Use: Yes     Comment: once a month   OB History    Gravida Para Term Preterm AB TAB SAB Ectopic Multiple Living   1    1  1    0     Review of Systems  Constitutional: Positive for chills. Negative for fever.  HENT: Negative for congestion and sore throat.   Eyes: Negative for visual disturbance.  Respiratory: Negative for cough, shortness of breath and wheezing.   Cardiovascular: Negative for chest  pain and palpitations.  Gastrointestinal: Positive for abdominal pain and diarrhea. Negative for nausea, vomiting and blood in stool.  Genitourinary: Negative for dysuria, urgency, frequency, hematuria, decreased urine volume, vaginal bleeding, vaginal discharge, difficulty urinating, genital sores, menstrual problem and pelvic pain.  Musculoskeletal: Positive for back pain. Negative for neck pain.  Skin: Negative for rash.  Neurological: Negative for weakness, light-headedness and headaches.      Allergies  Review of patient's allergies indicates no known allergies.  Home Medications   Prior to Admission medications   Medication Sig Start Date End Date Taking? Authorizing Provider  ibuprofen (ADVIL,MOTRIN) 200 MG tablet Take 600 mg by mouth every 6 (six) hours as needed for moderate pain (pain).   Yes Historical Provider, MD  metroNIDAZOLE (METROGEL VAGINAL) 0.75 % vaginal gel Place 1 Applicatorful vaginally at bedtime. Patient taking differently: Place 1 Applicatorful vaginally at bedtime as needed (infection).  05/06/14  Yes Luvenia Redden, PA-C  norgestimate-ethinyl estradiol (ORTHO-CYCLEN,SPRINTEC,PREVIFEM) 0.25-35 MG-MCG tablet Take 1 tablet by mouth daily.   Yes Historical Provider, MD  dicyclomine (BENTYL) 20 MG tablet Take 1 tablet (20 mg total) by mouth 2 (two) times daily. 06/21/14   Verda Cumins Dontrell Stuck, PA-C  metroNIDAZOLE (FLAGYL) 500 MG tablet Take 1 tablet (500 mg total) by mouth 2 (two) times daily. Patient not taking: Reported on 06/21/2014 05/06/14   Luvenia Redden, PA-C   BP 154/79 mmHg  Pulse 112  Temp(Src) 99.2 F (37.3 C) (Oral)  Resp  18  SpO2 100%  LMP 06/18/2014 Physical Exam  Constitutional: She appears well-developed and well-nourished. No distress.  HENT:  Head: Normocephalic and atraumatic.  Mouth/Throat: Oropharynx is clear and moist. No oropharyngeal exudate.  Eyes: Conjunctivae are normal. Pupils are equal, round, and reactive to light. Right eye  exhibits no discharge. Left eye exhibits no discharge.  Neck: Neck supple.  Cardiovascular: Normal rate, regular rhythm, normal heart sounds and intact distal pulses.  Exam reveals no gallop and no friction rub.   No murmur heard. HR 96.  Pulmonary/Chest: Effort normal and breath sounds normal. No respiratory distress. She has no wheezes. She has no rales.  Abdominal: Soft. Bowel sounds are normal. She exhibits no distension and no mass. There is tenderness. There is no rebound and no guarding.  Abdomen is soft. Bowel sounds are present. Patient has right lower quadrant tenderness to palpation. No rebound tenderness. Negative Rovsing sign.  Musculoskeletal: She exhibits no edema.  Back is nontender palpation. No edema, crepitus or step-offs noted.  Lymphadenopathy:    She has no cervical adenopathy.  Neurological: She is alert. Coordination normal.  Skin: Skin is warm and dry. No rash noted. She is not diaphoretic. No erythema. No pallor.  Psychiatric: She has a normal mood and affect. Her behavior is normal.  Nursing note and vitals reviewed.   ED Course  Procedures (including critical care time) Labs Review Labs Reviewed  CBC WITH DIFFERENTIAL/PLATELET - Abnormal; Notable for the following:    Neutrophils Relative % 83 (*)    Lymphocytes Relative 9 (*)    Lymphs Abs 0.6 (*)    All other components within normal limits  URINALYSIS, ROUTINE W REFLEX MICROSCOPIC - Abnormal; Notable for the following:    pH 8.5 (*)    Leukocytes, UA MODERATE (*)    All other components within normal limits  URINE MICROSCOPIC-ADD ON - Abnormal; Notable for the following:    Squamous Epithelial / LPF FEW (*)    All other components within normal limits  COMPREHENSIVE METABOLIC PANEL  PREGNANCY, URINE    Imaging Review Ct Abdomen Pelvis W Contrast  06/21/2014   CLINICAL DATA:  Low abdominal pain, right greater than. Onset at 1400 hours today.  EXAM: CT ABDOMEN AND PELVIS WITH CONTRAST  TECHNIQUE:  Multidetector CT imaging of the abdomen and pelvis was performed using the standard protocol following bolus administration of intravenous contrast.  CONTRAST:  176mL OMNIPAQUE IOHEXOL 300 MG/ML  SOLN  COMPARISON:  Ultrasound 08/13/2013  FINDINGS: Lower chest: Minimal linear scarring or atelectasis in the left posterior base.  Hepatobiliary: There are normal appearances of the liver, gallbladder and bile ducts.  Pancreas: Normal  Spleen: Normal  Adrenals/Urinary Tract: The adrenals and kidneys are normal in appearance. There is no urinary calculus evident. There is no hydronephrosis or ureteral dilatation. Collecting systems and ureters appear unremarkable.  Stomach/Bowel: The stomach, small bowel and colon appear normal.  Vascular/Lymphatic: The abdominal aorta is normal in caliber. There is no atherosclerotic calcification. There is no adenopathy in the abdomen or pelvis.  Reproductive: There is a 2.2 cm myometrial mass in the anterior uterine mid body to the left of midline, presumably a fibroid. There are otherwise normal appearances of the uterus and ovaries.  Other: No ascites. No acute inflammatory changes are evident in the abdomen or pelvis.  Musculoskeletal: No significant abnormalities  IMPRESSION: 2.2 cm presumed uterine fibroid in the anterior mid body to the left of midline. No acute findings are evident in the abdomen  or pelvis.   Electronically Signed   By: Andreas Newport M.D.   On: 06/21/2014 23:07     EKG Interpretation None      Filed Vitals:   06/21/14 1904 06/21/14 2113 06/22/14 0007  BP: 157/83 150/78 154/79  Pulse: 113 110 112  Temp: 98.4 F (36.9 C)  99.2 F (37.3 C)  TempSrc: Oral  Oral  Resp: 18 17 18   SpO2: 100% 100% 100%     MDM   Meds given in ED:  Medications  sodium chloride 0.9 % bolus 1,000 mL (0 mLs Intravenous Stopped 06/21/14 2259)  dicyclomine (BENTYL) capsule 20 mg (20 mg Oral Given 06/21/14 2106)  iohexol (OMNIPAQUE) 300 MG/ML solution 100 mL (100  mLs Intravenous Contrast Given 06/21/14 2253)    Discharge Medication List as of 06/21/2014 11:57 PM    START taking these medications   Details  dicyclomine (BENTYL) 20 MG tablet Take 1 tablet (20 mg total) by mouth 2 (two) times daily., Starting 06/21/2014, Until Discontinued, Print        Final diagnoses:  Diarrhea  Right lower quadrant abdominal pain   This is a 24 y.o. female who presented to the ED complaining abdominal pain and diarrhea for the past 6 hours. She reports 6 episodes of diarrhea today and associated abdominal cramping.  On exam the patient has right lower quadrant abdominal tenderness to palpation. Patient is afebrile nontoxic appearing. Patient is not tachycardic on my exam and has a heart rate of 96. Patient has a negative urine pregnancy test. Patient's urinalysis indicates moderate leukocytes. Patient denies any urinary symptoms. Patient's CBC is unremarkable. The patient's CMP is unremarkable. CT on and pelvis with contrast indicates a 2.2 cm presumed uterine fibroid, no evidence of appendicitis or acute findings. Patient reports feeling much better after fluid bolus and Bentyl in the ED. Will discharge patient with perception of her Bentyl. I advised she can use loperamide at home for continued diarrhea. Tolerating by mouth liquids in the ED. I advised the patient to follow-up with their primary care provider this week. I advised the patient to return to the emergency department with new or worsening symptoms or new concerns. The patient verbalized understanding and agreement with plan.   This patient was discussed with and evaluated by Dr. Tawnya Crook who agrees with assessment and plan.     Hanley Hays, PA-C 06/22/14 4585  Ernestina Patches, MD 06/22/14 [phone]

## 2014-06-21 NOTE — ED Notes (Signed)
Pt presents with c/o abdominal pain and diarrhea that started today around 2 pm. Pt denies any vomiting but has had some nausea.

## 2014-06-21 NOTE — Discharge Instructions (Signed)
Diarrhea Diarrhea is frequent loose and watery bowel movements. It can cause you to feel weak and dehydrated. Dehydration can cause you to become tired and thirsty, have a dry mouth, and have decreased urination that often is dark yellow. Diarrhea is a sign of another problem, most often an infection that will not last long. In most cases, diarrhea typically lasts 2-3 days. However, it can last longer if it is a sign of something more serious. It is important to treat your diarrhea as directed by your caregiver to lessen or prevent future episodes of diarrhea. CAUSES  Some common causes include:  Gastrointestinal infections caused by viruses, bacteria, or parasites.  Food poisoning or food allergies.  Certain medicines, such as antibiotics, chemotherapy, and laxatives.  Artificial sweeteners and fructose.  Digestive disorders. HOME CARE INSTRUCTIONS  Ensure adequate fluid intake (hydration): Have 1 cup (8 oz) of fluid for each diarrhea episode. Avoid fluids that contain simple sugars or sports drinks, fruit juices, whole milk products, and sodas. Your urine should be clear or pale yellow if you are drinking enough fluids. Hydrate with an oral rehydration solution that you can purchase at pharmacies, retail stores, and online. You can prepare an oral rehydration solution at home by mixing the following ingredients together:   - tsp table salt.   tsp baking soda.   tsp salt substitute containing potassium chloride.  1  tablespoons sugar.  1 L (34 oz) of water.  Certain foods and beverages may increase the speed at which food moves through the gastrointestinal (GI) tract. These foods and beverages should be avoided and include:  Caffeinated and alcoholic beverages.  High-fiber foods, such as raw fruits and vegetables, nuts, seeds, and whole grain breads and cereals.  Foods and beverages sweetened with sugar alcohols, such as xylitol, sorbitol, and mannitol.  Some foods may be well  tolerated and may help thicken stool including:  Starchy foods, such as rice, toast, pasta, low-sugar cereal, oatmeal, grits, baked potatoes, crackers, and bagels.  Bananas.  Applesauce.  Add probiotic-rich foods to help increase healthy bacteria in the GI tract, such as yogurt and fermented milk products.  Wash your hands well after each diarrhea episode.  Only take over-the-counter or prescription medicines as directed by your caregiver.  Take a warm bath to relieve any burning or pain from frequent diarrhea episodes. SEEK IMMEDIATE MEDICAL CARE IF:   You are unable to keep fluids down.  You have persistent vomiting.  You have blood in your stool, or your stools are black and tarry.  You do not urinate in 6-8 hours, or there is only a small amount of very dark urine.  You have abdominal pain that increases or localizes.  You have weakness, dizziness, confusion, or light-headedness.  You have a severe headache.  Your diarrhea gets worse or does not get better.  You have a fever or persistent symptoms for more than 2-3 days.  You have a fever and your symptoms suddenly get worse. MAKE SURE YOU:   Understand these instructions.  Will watch your condition.  Will get help right away if you are not doing well or get worse. Document Released: 04/13/2002 Document Revised: 09/07/2013 Document Reviewed: 12/30/2011 [facility] Patient Information 2015 [place], [place]. This information is not intended to replace advice given to you by your health care provider. Make sure you discuss any questions you have with your health care provider.  Abdominal Pain, Women Abdominal (stomach, pelvic, or belly) pain can be caused by many things. It  is important to tell your doctor:  The location of the pain.  Does it come and go or is it present all the time?  Are there things that start the pain (eating certain foods, exercise)?  Are there other symptoms associated with the pain (fever,  nausea, vomiting, diarrhea)? All of this is helpful to know when trying to find the cause of the pain. CAUSES   Stomach: virus or bacteria infection, or ulcer.  Intestine: appendicitis (inflamed appendix), regional ileitis (Crohn's disease), ulcerative colitis (inflamed colon), irritable bowel syndrome, diverticulitis (inflamed diverticulum of the colon), or cancer of the stomach or intestine.  Gallbladder disease or stones in the gallbladder.  Kidney disease, kidney stones, or infection.  Pancreas infection or cancer.  Fibromyalgia (pain disorder).  Diseases of the female organs:  Uterus: fibroid (non-cancerous) tumors or infection.  Fallopian tubes: infection or tubal pregnancy.  Ovary: cysts or tumors.  Pelvic adhesions (scar tissue).  Endometriosis (uterus lining tissue growing in the pelvis and on the pelvic organs).  Pelvic congestion syndrome (female organs filling up with blood just before the menstrual period).  Pain with the menstrual period.  Pain with ovulation (producing an egg).  Pain with an IUD (intrauterine device, birth control) in the uterus.  Cancer of the female organs.  Functional pain (pain not caused by a disease, may improve without treatment).  Psychological pain.  Depression. DIAGNOSIS  Your doctor will decide the seriousness of your pain by doing an examination.  Blood tests.  X-rays.  Ultrasound.  CT scan (computed tomography, special type of X-ray).  MRI (magnetic resonance imaging).  Cultures, for infection.  Barium enema (dye inserted in the large intestine, to better view it with X-rays).  Colonoscopy (looking in intestine with a lighted tube).  Laparoscopy (minor surgery, looking in abdomen with a lighted tube).  Major abdominal exploratory surgery (looking in abdomen with a large incision). TREATMENT  The treatment will depend on the cause of the pain.   Many cases can be observed and treated at  home.  Over-the-counter medicines recommended by your caregiver.  Prescription medicine.  Antibiotics, for infection.  Birth control pills, for painful periods or for ovulation pain.  Hormone treatment, for endometriosis.  Nerve blocking injections.  Physical therapy.  Antidepressants.  Counseling with a psychologist or psychiatrist.  Minor or major surgery. HOME CARE INSTRUCTIONS   Do not take laxatives, unless directed by your caregiver.  Take over-the-counter pain medicine only if ordered by your caregiver. Do not take aspirin because it can cause an upset stomach or bleeding.  Try a clear liquid diet (broth or water) as ordered by your caregiver. Slowly move to a bland diet, as tolerated, if the pain is related to the stomach or intestine.  Have a thermometer and take your temperature several times a day, and record it.  Bed rest and sleep, if it helps the pain.  Avoid sexual intercourse, if it causes pain.  Avoid stressful situations.  Keep your follow-up appointments and tests, as your caregiver orders.  If the pain does not go away with medicine or surgery, you may try:  Acupuncture.  Relaxation exercises (yoga, meditation).  Group therapy.  Counseling. SEEK MEDICAL CARE IF:   You notice certain foods cause stomach pain.  Your home care treatment is not helping your pain.  You need stronger pain medicine.  You want your IUD removed.  You feel faint or lightheaded.  You develop nausea and vomiting.  You develop a rash.  You are  having side effects or an allergy to your medicine. SEEK IMMEDIATE MEDICAL CARE IF:   Your pain does not go away or gets worse.  You have a fever.  Your pain is felt only in portions of the abdomen. The right side could possibly be appendicitis. The left lower portion of the abdomen could be colitis or diverticulitis.  You are passing blood in your stools (bright red or black tarry stools, with or without  vomiting).  You have blood in your urine.  You develop chills, with or without a fever.  You pass out. MAKE SURE YOU:   Understand these instructions.  Will watch your condition.  Will get help right away if you are not doing well or get worse. Document Released: 02/18/2007 Document Revised: 09/07/2013 Document Reviewed: 03/10/2009 [facility] Patient Information 2015 [place], [place]. This information is not intended to replace advice given to you by your health care provider. Make sure you discuss any questions you have with your health care provider.

## 2014-07-08 ENCOUNTER — Emergency Department (HOSPITAL_COMMUNITY)
Admission: EM | Admit: 2014-07-08 | Discharge: 2014-07-08 | Disposition: A | Discharge status: Home / Self Care | Payer: Medicaid Other | Attending: Emergency Medicine | Admitting: Emergency Medicine

## 2014-07-08 ENCOUNTER — Encounter (HOSPITAL_COMMUNITY): Payer: Self-pay

## 2014-07-08 DIAGNOSIS — R51 Headache: Secondary | ICD-10-CM | POA: Insufficient documentation

## 2014-07-08 DIAGNOSIS — Z792 Long term (current) use of antibiotics: Secondary | ICD-10-CM | POA: Diagnosis not present

## 2014-07-08 DIAGNOSIS — R42 Dizziness and giddiness: Secondary | ICD-10-CM | POA: Insufficient documentation

## 2014-07-08 DIAGNOSIS — Z793 Long term (current) use of hormonal contraceptives: Secondary | ICD-10-CM | POA: Insufficient documentation

## 2014-07-08 DIAGNOSIS — J029 Acute pharyngitis, unspecified: Secondary | ICD-10-CM | POA: Diagnosis present

## 2014-07-08 DIAGNOSIS — Z87440 Personal history of urinary (tract) infections: Secondary | ICD-10-CM | POA: Insufficient documentation

## 2014-07-08 DIAGNOSIS — R000 Tachycardia, unspecified: Secondary | ICD-10-CM | POA: Diagnosis not present

## 2014-07-08 DIAGNOSIS — R079 Chest pain, unspecified: Secondary | ICD-10-CM | POA: Diagnosis not present

## 2014-07-08 DIAGNOSIS — Z79899 Other long term (current) drug therapy: Secondary | ICD-10-CM | POA: Insufficient documentation

## 2014-07-08 DIAGNOSIS — R05 Cough: Secondary | ICD-10-CM | POA: Diagnosis not present

## 2014-07-08 DIAGNOSIS — R6889 Other general symptoms and signs: Secondary | ICD-10-CM

## 2014-07-08 DIAGNOSIS — R630 Anorexia: Secondary | ICD-10-CM | POA: Diagnosis not present

## 2014-07-08 DIAGNOSIS — R0981 Nasal congestion: Secondary | ICD-10-CM | POA: Diagnosis not present

## 2014-07-08 MED ORDER — LORATADINE-PSEUDOEPHEDRINE ER 5-120 MG PO TB12: 1.0000 | ORAL_TABLET | Freq: Two times a day (BID) | ORAL | Status: DC

## 2014-07-08 MED ORDER — BENZONATATE 100 MG PO CAPS: 200.0000 mg | ORAL_CAPSULE | Freq: Three times a day (TID) | ORAL | Status: DC | PRN

## 2014-07-08 NOTE — ED Provider Notes (Signed)
CSN: [id number]     Arrival date & time 07/08/14  2101 History  This chart was scribed for non-physician practitioner, Dewaine Oats, PA-C,  working with Wandra Arthurs, MD, by Jeanell Sparrow, ED Scribe. This patient was seen in room WTR8/WTR8 and the patient's care was started at 9:19 PM.   Chief Complaint  Patient presents with  . Sore Throat   The history is provided by the patient. No language interpreter was used.   HPI Comments: Paige Fritz is a 24 y.o. female who presents to the Emergency Department complaining of a constant moderate sore throat that started about a week ago. She states that she has been sick for about a week, and swallowing and cough exacerbates the sore throat. She reports having a subjective, fever, chills, chest pain, and a non productive cough a week ago. She states that her symptoms got better 3 days ago, then worsened 2 days ago. She reports that 2 days ago she had 6 episodes of diarrhea, nasal congestion, cough productive of green mucous, chest pain, and sneezing. She states that the chest pain, cough, sore throat, and congestion still persist today. She describes the chest pain as an achy sensation after she coughs. She currently rates the severity of the pain as a 7/10. She states that she has had headache and decreased appetite. She reports having dizziness at work today. She states that she took mucinex and theraflu with minimal relief. She denies any diarrhea since 2 days ago. She also denies any vomiting or vision changes.   Past Medical History  Diagnosis Date  . UTI (urinary tract infection)   . [id number](941.7)    Past Surgical History  Procedure Laterality Date  . No past surgeries     Family History  Problem Relation Age of Onset  . Hypertension Father    History  Substance Use Topics  . Smoking status: Never Smoker   . Smokeless tobacco: Never Used  . Alcohol Use: Yes     Comment: once a month   OB History    Gravida Para Term Preterm  AB TAB SAB Ectopic Multiple Living   1    1  1    0     Review of Systems  Constitutional: Positive for appetite change.  HENT: Positive for congestion (nasal ) and sore throat.   Eyes: Negative for visual disturbance.  Respiratory: Positive for cough.   Cardiovascular: Positive for chest pain.  Gastrointestinal: Negative for vomiting.  Neurological: Positive for dizziness and headaches.    Allergies  Review of patient's allergies indicates no known allergies.  Home Medications   Prior to Admission medications   Medication Sig Start Date End Date Taking? Authorizing Provider  dicyclomine (BENTYL) 20 MG tablet Take 1 tablet (20 mg total) by mouth 2 (two) times daily. 06/21/14   Verda Cumins Dansie, PA-C  ibuprofen (ADVIL,MOTRIN) 200 MG tablet Take 600 mg by mouth every 6 (six) hours as needed for moderate pain (pain).    Historical Provider, MD  metroNIDAZOLE (FLAGYL) 500 MG tablet Take 1 tablet (500 mg total) by mouth 2 (two) times daily. Patient not taking: Reported on 06/21/2014 05/06/14   Luvenia Redden, PA-C  metroNIDAZOLE (METROGEL VAGINAL) 0.75 % vaginal gel Place 1 Applicatorful vaginally at bedtime. Patient taking differently: Place 1 Applicatorful vaginally at bedtime as needed (infection).  05/06/14   Luvenia Redden, PA-C  norgestimate-ethinyl estradiol (ORTHO-CYCLEN,SPRINTEC,PREVIFEM) 0.25-35 MG-MCG tablet Take 1 tablet by mouth daily.    Historical  Provider, MD   BP 146/89 mmHg  Pulse 110  Temp(Src) 98 F (36.7 C) (Oral)  Resp 20  Ht 5' (1.524 m)  Wt 155 lb (70.308 kg)  BMI 30.27 kg/m2  SpO2 100%  LMP 06/18/2014 Physical Exam  Constitutional: She is oriented to person, place, and time. She appears well-developed and well-nourished. No distress.  HENT:  Head: Normocephalic and atraumatic.  Right Ear: Tympanic membrane normal.  Left Ear: Tympanic membrane normal.  Throat is erythematous. Uvulas midline. No swelling.   Neck: Neck supple. No tracheal deviation  present.  Cardiovascular: Normal rate, regular rhythm and normal heart sounds.  Exam reveals no gallop and no friction rub.   No murmur heard. Pulmonary/Chest: Effort normal and breath sounds normal. No respiratory distress. She has no wheezes. She has no rales.  Musculoskeletal: Normal range of motion.  Neurological: She is alert and oriented to person, place, and time.  Skin: Skin is warm and dry.  Psychiatric: She has a normal mood and affect. Her behavior is normal.  Nursing note and vitals reviewed.   ED Course  Procedures (including critical care time) DIAGNOSTIC STUDIES: Oxygen Saturation is 100% on RA, normal by my interpretation.    COORDINATION OF CARE: 9:23 PM- Pt advised of plan for treatment and pt agrees.  Labs Review Labs Reviewed - No data to display  Imaging Review No results found.   EKG Interpretation None      MDM   Final diagnoses:  None    1. Flu-like illness  Patient is mildly tachycardic with history of same on recent visits. She is very well appearing, multiple complaints of a week's duration. No persistent diarrhea. She states the cough at night bothers her most. Will treat symptomatically.  I personally performed the services described in this documentation, which was scribed in my presence. The recorded information has been reviewed and is accurate.      Dewaine Oats, PA-C 07/08/14 2214  Wandra Arthurs, MD 07/08/14 [phone]

## 2014-07-08 NOTE — ED Notes (Signed)
Patient reports she has had intermittent fever over the past week along with body aches.  Complains of productive cough and congestion.

## 2014-07-08 NOTE — ED Notes (Signed)
PA-C at bedside 

## 2014-07-08 NOTE — Discharge Instructions (Signed)
Acetaminophen oral solution What is this medicine? ACETAMINOPHEN (a set a MEE noe fen) is a pain reliever. It is used to treat mild pain and fever. This medicine may be used for other purposes; ask your health care provider or pharmacist if you have questions. COMMON BRAND NAME(S): Apra, Comtrex Sore Throat Relief, ED-APAP, ElixSure Fever/Pain, LIQUID PAIN RELIEF, Mapap, Pain and Fever, Q-Pap, Silapap, Triaminic Fever Reducer and Pain Reliever, Triaminic Infant Fever Reducer and Pain Reliever, Tylenol, Tylenol Extra Strength, Tylenol Sore Throat What should I tell my health care provider before I take this medicine? They need to know if you have any of these conditions: -if you often drink alcohol -liver disease -phenylketonuria -an unusual or allergic reaction to acetaminophen, other medicines, foods, dyes, or preservatives -pregnant or trying to get pregnant -breast-feeding How should I use this medicine? Take this medicine by mouth. This medicine comes in more than one concentration. Check the concentration on the label before every dose to make sure you are giving the right dose. Follow the directions on the package or prescription label. Use a specially marked spoon or dropper to measure each dose. Ask your pharmacist if you do not have one. Household spoons are not accurate. Do not take your medicine more often than directed. Talk to your pediatrician regarding the use of this medicine in children. While this drug may be prescribed for children as young as 58 years old for selected conditions, precautions do apply. Overdosage: If you think you have taken too much of this medicine contact a poison control center or emergency room at once. NOTE: This medicine is only for you. Do not share this medicine with others. What if I miss a dose? If you miss a dose, take it as soon as you can. If it is almost time for your next dose, take only that dose. Do not take double or extra doses. What may  interact with this medicine? -alcohol -imatinib -isoniazid -other medicines that contain acetaminophen This list may not describe all possible interactions. Give your health care provider a list of all the medicines, herbs, non-prescription drugs, or dietary supplements you use. Also tell them if you smoke, drink alcohol, or use illegal drugs. Some items may interact with your medicine. What should I watch for while using this medicine? Tell your doctor or health care professional if the pain lasts more than 10 days (5 days for children), if it gets worse, or if there is a new or different kind of pain. Also, check with your doctor if a fever lasts for more than 3 days. Do not take acetaminophen (Tylenol) or other medicines that contain acetaminophen with this medicine. Too much acetaminophen can be very dangerous and cause an overdose. Always read labels carefully. Report any possible overdose to your doctor right away, even if there are no symptoms. The effects of extra doses may not be seen for many days. What side effects may I notice from receiving this medicine? Side effects that you should report to your doctor or health care professional as soon as possible: -allergic reactions like skin rash, itching or hives, swelling of the face, lips, or tongue -breathing problems -redness, blistering, peeling or loosening of the skin, including inside the mouth -sore throat with fever, headache, rash, nausea, or vomiting -trouble passing urine or change in the amount of urine -unusual bleeding or bruising -unusually weak or tired -yellowing of the eyes, skin Side effects that usually do not require medical attention (report to your doctor or  health care professional if they continue or are bothersome): -headache -nausea, stomach upset This list may not describe all possible side effects. Call your doctor for medical advice about side effects. You may report side effects to FDA at  1-800-FDA-1088. Where should I keep my medicine? Keep out of reach of children. Store at room temperature between 20 and 25 degrees C (68 and 77 degrees F). Protect from moisture and heat. Throw away any unused medicine after the expiration date. NOTE: This sheet is a summary. It may not cover all possible information. If you have questions about this medicine, talk to your doctor, pharmacist, or health care provider.  2015, Elsevier/Gold Standard. (2012-12-15 13:56:24) Cough, Adult  A cough is a reflex that helps clear your throat and airways. It can help heal the body or may be a reaction to an irritated airway. A cough may only last 2 or 3 weeks (acute) or may last more than 8 weeks (chronic).  CAUSES Acute cough:  Viral or bacterial infections. Chronic cough:  Infections.  Allergies.  Asthma.  Post-nasal drip.  Smoking.  Heartburn or acid reflux.  Some medicines.  Chronic lung problems (COPD).  Cancer. SYMPTOMS   Cough.  Fever.  Chest pain.  Increased breathing rate.  High-pitched whistling sound when breathing (wheezing).  Colored mucus that you cough up (sputum). TREATMENT   A bacterial cough may be treated with antibiotic medicine.  A viral cough must run its course and will not respond to antibiotics.  Your caregiver may recommend other treatments if you have a chronic cough. HOME CARE INSTRUCTIONS   Only take over-the-counter or prescription medicines for pain, discomfort, or fever as directed by your caregiver. Use cough suppressants only as directed by your caregiver.  Use a cold steam vaporizer or humidifier in your bedroom or home to help loosen secretions.  Sleep in a semi-upright position if your cough is worse at night.  Rest as needed.  Stop smoking if you smoke. SEEK IMMEDIATE MEDICAL CARE IF:   You have pus in your sputum.  Your cough starts to worsen.  You cannot control your cough with suppressants and are losing sleep.  You  begin coughing up blood.  You have difficulty breathing.  You develop pain which is getting worse or is uncontrolled with medicine.  You have a fever. MAKE SURE YOU:   Understand these instructions.  Will watch your condition.  Will get help right away if you are not doing well or get worse. Document Released: 10/20/2010 Document Revised: 07/16/2011 Document Reviewed: 10/20/2010 [facility] Patient Information 2015 [place], [place]. This information is not intended to replace advice given to you by your health care provider. Make sure you discuss any questions you have with your health care provider.

## 2014-08-14 ENCOUNTER — Encounter (HOSPITAL_COMMUNITY): Payer: Self-pay | Admitting: *Deleted

## 2014-08-14 ENCOUNTER — Inpatient Hospital Stay (HOSPITAL_COMMUNITY)
Admission: AD | Admit: 2014-08-14 | Discharge: 2014-08-14 | Discharge status: Against Medical Advice | Payer: Medicaid Other | Source: Ambulatory Visit | Attending: Obstetrics & Gynecology | Admitting: Obstetrics & Gynecology

## 2014-08-14 DIAGNOSIS — N760 Acute vaginitis: Secondary | ICD-10-CM | POA: Insufficient documentation

## 2014-08-14 DIAGNOSIS — Z5321 Procedure and treatment not carried out due to patient leaving prior to being seen by health care provider: Secondary | ICD-10-CM | POA: Insufficient documentation

## 2014-08-14 LAB — URINALYSIS, ROUTINE W REFLEX MICROSCOPIC
Bilirubin Urine: NEGATIVE
Glucose, UA: NEGATIVE mg/dL
HGB URINE DIPSTICK: NEGATIVE
Ketones, ur: NEGATIVE mg/dL
LEUKOCYTES UA: NEGATIVE
Nitrite: NEGATIVE
PROTEIN: NEGATIVE mg/dL
UROBILINOGEN UA: 0.2 mg/dL (ref 0.0–1.0)
pH: 5 (ref 5.0–8.0)

## 2014-08-14 LAB — POCT PREGNANCY, URINE: Preg Test, Ur: NEGATIVE

## 2014-08-14 NOTE — MAU Note (Signed)
Had a miscarriage 1 yr ago, and every since then, i keep getting BV.  Had a period this month for 10 days. No intercourse x 1 month,  Has taken pills, gel and as soon as my period starts it comes back.  Using BCP given by in clinic

## 2014-09-05 ENCOUNTER — Inpatient Hospital Stay (HOSPITAL_COMMUNITY)
Admission: AD | Admit: 2014-09-05 | Discharge: 2014-09-05 | Disposition: A | Discharge status: Home / Self Care | Payer: Self-pay | Source: Ambulatory Visit | Attending: Obstetrics and Gynecology | Admitting: Obstetrics and Gynecology

## 2014-09-05 DIAGNOSIS — N898 Other specified noninflammatory disorders of vagina: Secondary | ICD-10-CM | POA: Insufficient documentation

## 2014-09-05 LAB — URINALYSIS, ROUTINE W REFLEX MICROSCOPIC
BILIRUBIN URINE: NEGATIVE
Glucose, UA: NEGATIVE mg/dL
Hgb urine dipstick: NEGATIVE
Ketones, ur: NEGATIVE mg/dL
Leukocytes, UA: NEGATIVE
Nitrite: NEGATIVE
Protein, ur: NEGATIVE mg/dL
Specific Gravity, Urine: 1.015 (ref 1.005–1.030)
UROBILINOGEN UA: 0.2 mg/dL (ref 0.0–1.0)
pH: 7 (ref 5.0–8.0)

## 2014-09-05 LAB — WET PREP, GENITAL
Clue Cells Wet Prep HPF POC: NONE SEEN
Trich, Wet Prep: NONE SEEN
Yeast Wet Prep HPF POC: NONE SEEN

## 2014-09-05 LAB — POCT PREGNANCY, URINE: PREG TEST UR: NEGATIVE

## 2014-09-05 NOTE — MAU Note (Signed)
Pt states she has an odor after her period, states this has been happening for a year and they keep treating her for a bacterial infection.

## 2014-09-05 NOTE — MAU Provider Note (Signed)
History     CSN: [id number]  Arrival date and time: 09/05/14 2042   None     No chief complaint on file.  HPI  Pt is a 24 yo female here with report of green vaginal discharge that starts after her cycle.  +odor.  Reports that symptoms always continues.  Patient's last menstrual period was 08/28/2014.  Past Medical History  Diagnosis Date  . UTI (urinary tract infection)   . BPZWCHEN(277.8)     Past Surgical History  Procedure Laterality Date  . No past surgeries      Family History  Problem Relation Age of Onset  . Hypertension Father     History  Substance Use Topics  . Smoking status: Never Smoker   . Smokeless tobacco: Never Used  . Alcohol Use: Yes     Comment: once a month    Allergies: No Known Allergies  Prescriptions prior to admission  Medication Sig Dispense Refill Last Dose  . benzonatate (TESSALON PERLES) 100 MG capsule Take 2 capsules (200 mg total) by mouth 3 (three) times daily as needed for cough. 20 capsule 0   . dicyclomine (BENTYL) 20 MG tablet Take 1 tablet (20 mg total) by mouth 2 (two) times daily. (Patient not taking: Reported on 07/08/2014) 20 tablet 0   . guaiFENesin (MUCINEX) 600 MG 12 hr tablet Take 600 mg by mouth 2 (two) times daily.   07/08/2014 at Unknown time  . ibuprofen (ADVIL,MOTRIN) 200 MG tablet Take 600 mg by mouth every 6 (six) hours as needed for moderate pain (pain).   Past Month at Unknown time  . loratadine-pseudoephedrine (CLARITIN-D 12 HOUR) 5-120 MG per tablet Take 1 tablet by mouth 2 (two) times daily. 20 tablet 0   . metroNIDAZOLE (FLAGYL) 500 MG tablet Take 1 tablet (500 mg total) by mouth 2 (two) times daily. (Patient not taking: Reported on 06/21/2014) 14 tablet 0 Completed Course at Unknown time  . metroNIDAZOLE (METROGEL VAGINAL) 0.75 % vaginal gel Place 1 Applicatorful vaginally at bedtime. (Patient not taking: Reported on 07/08/2014) 70 g 0 Past Month at Unknown time  . norgestimate-ethinyl estradiol  (ORTHO-CYCLEN,SPRINTEC,PREVIFEM) 0.25-35 MG-MCG tablet Take 1 tablet by mouth daily.   07/08/2014 at Unknown time    Review of Systems  Gastrointestinal: Negative for nausea, vomiting and abdominal pain.  Genitourinary: Negative for dysuria, urgency and frequency.       Vaginal discharge or odor  All other systems reviewed and are negative.  Physical Exam   Blood pressure 126/84, pulse 104, temperature 98.1 F (36.7 C), temperature source Oral, resp. rate 16, height 5' (1.524 m), weight 76.658 kg (169 lb), last menstrual period 08/28/2014, SpO2 100 %.  Physical Exam  Constitutional: She is oriented to person, place, and time. She appears well-developed and well-nourished. No distress.  HENT:  Head: Normocephalic.  Neck: Normal range of motion. Neck supple.  Cardiovascular: Normal rate, regular rhythm and normal heart sounds.  Exam reveals no gallop and no friction rub.   No murmur heard. Respiratory: Effort normal and breath sounds normal. No respiratory distress.  GI: Soft. She exhibits no mass. There is no tenderness. There is no rebound, no guarding and no CVA tenderness.  Genitourinary: Cervix exhibits no motion tenderness and no discharge. No vaginal discharge found.  Musculoskeletal: Normal range of motion.  Neurological: She is alert and oriented to person, place, and time.  Skin: Skin is warm and dry.  Psychiatric: She has a normal mood and affect.    MAU  Course  Procedures  Results for orders placed or performed during the hospital encounter of 09/05/14 (from the past 24 hour(s))  Urinalysis, Routine w reflex microscopic     Status: None   Collection Time: 09/05/14  8:45 PM  Result Value Ref Range   Color, Urine YELLOW YELLOW   APPearance CLEAR CLEAR   Specific Gravity, Urine 1.015 1.005 - 1.030   pH 7.0 5.0 - 8.0   Glucose, UA NEGATIVE NEGATIVE mg/dL   Hgb urine dipstick NEGATIVE NEGATIVE   Bilirubin Urine NEGATIVE NEGATIVE   Ketones, ur NEGATIVE NEGATIVE mg/dL    Protein, ur NEGATIVE NEGATIVE mg/dL   Urobilinogen, UA 0.2 0.0 - 1.0 mg/dL   Nitrite NEGATIVE NEGATIVE   Leukocytes, UA NEGATIVE NEGATIVE  Wet prep, genital     Status: Abnormal   Collection Time: 09/05/14 10:12 PM  Result Value Ref Range   Yeast Wet Prep HPF POC NONE SEEN NONE SEEN   Trich, Wet Prep NONE SEEN NONE SEEN   Clue Cells Wet Prep HPF POC NONE SEEN NONE SEEN   WBC, Wet Prep HPF POC FEW (A) NONE SEEN     Assessment and Plan  Vaginal Discharge - normal exam  Plan: GC/CT pending HIV pending  Kathrine Haddock N 09/05/2014, 10:01 PM

## 2014-09-06 LAB — GC/CHLAMYDIA PROBE AMP (~~LOC~~) NOT AT ARMC
Chlamydia: NEGATIVE
NEISSERIA GONORRHEA: NEGATIVE

## 2014-09-06 LAB — HIV ANTIBODY (ROUTINE TESTING W REFLEX): HIV Screen 4th Generation wRfx: NONREACTIVE

## 2014-09-19 ENCOUNTER — Telehealth: Payer: Self-pay | Admitting: Medical

## 2014-09-19 NOTE — Telephone Encounter (Signed)
Patient called MAU stating Rx had not been received at pharmacy from visit on 09/05/14. Per provider note, no Rx was sent because there was not reason for Rx. Attempted to contact patient. No answer and mailbox is full.   Luvenia Redden, PA-C  09/19/2014 5:15 PM

## 2014-09-19 NOTE — Telephone Encounter (Signed)
Patient returned call to MAU. States that she was told she was getting Rx for "something new" for BV. She states that it is not an antibiotic, but can't remember what it was called. In-basket message sent to Kathrine Haddock, CNM to clarify plan of care.   Luvenia Redden, PA-C 09/19/2014 5:21 PM

## 2014-09-21 ENCOUNTER — Other Ambulatory Visit: Payer: Self-pay | Admitting: Family

## 2014-09-21 MED ORDER — HYLAFEM VA SUPP
1.0000 | Freq: Every evening | VAGINAL | Status: DC
Start: 2014-09-21 — End: 2015-07-02

## 2015-06-08 IMAGING — CT CT ABD-PELV W/ CM
1 of 2 series · 15 of 32 positions shown, 19 images · IV contrast (OMNIPAQUE 300)
Comparison: Ultrasound 08/13/2013

CLINICAL DATA: Low abdominal pain, right greater than. Onset at
1188 hours today.

EXAM:
CT ABDOMEN AND PELVIS WITH CONTRAST
TECHNIQUE: Multidetector CT imaging of the abdomen and pelvis was performed
using the standard protocol following bolus administration of
intravenous contrast.
CONTRAST:  100mL OMNIPAQUE IOHEXOL 300 MG/ML  SOLN

[Series 2: abd/pel with · axial · 0.67mm/px · z∈[+664,+1049]mm · 15 of 85 slices shown, 19 images]
[im 4/85  soft-tissue]
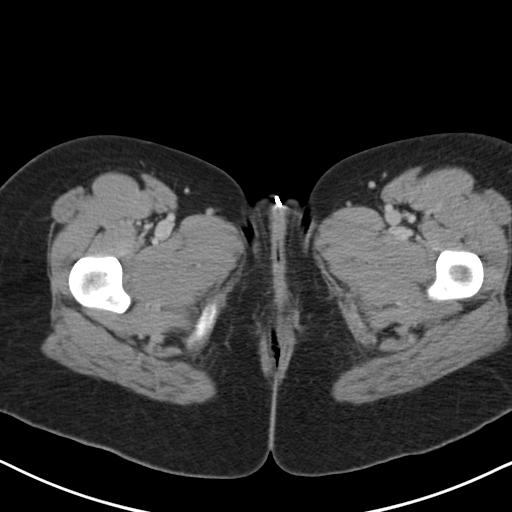
[im 4/85  bone]
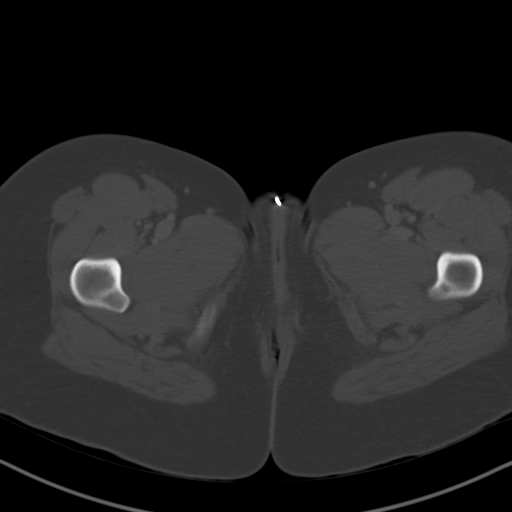
[im 11/85  soft-tissue]
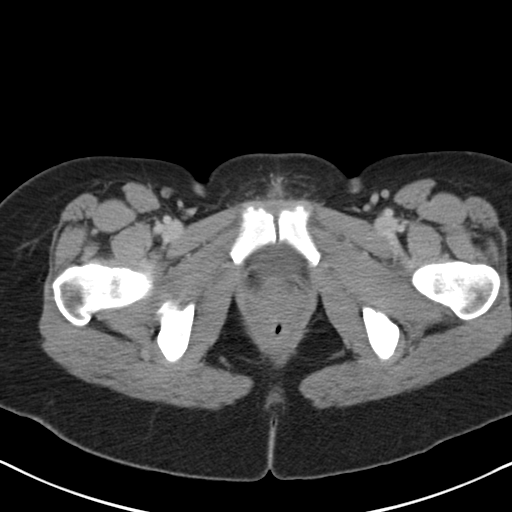
[im 17/85  soft-tissue]
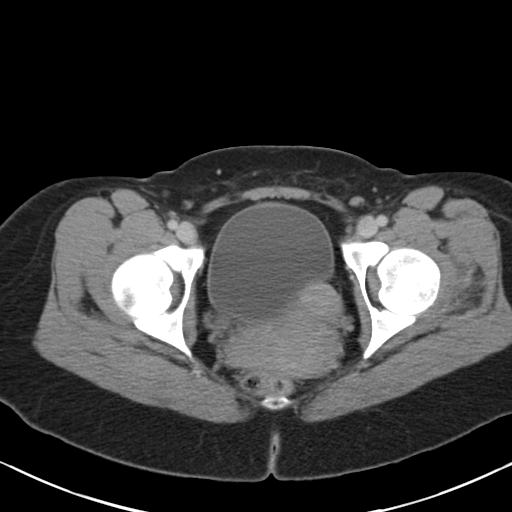
[im 24/85  soft-tissue]
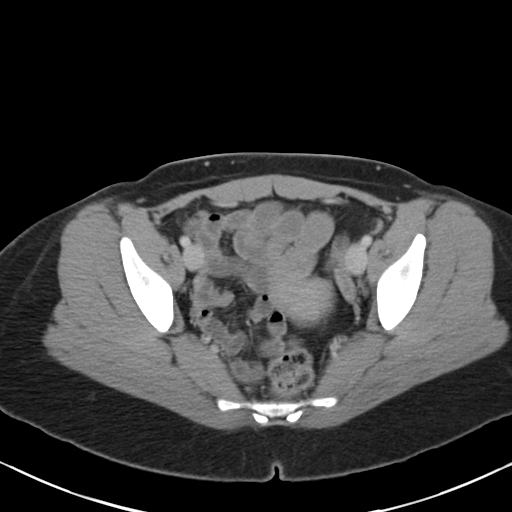
[im 31/85  soft-tissue]
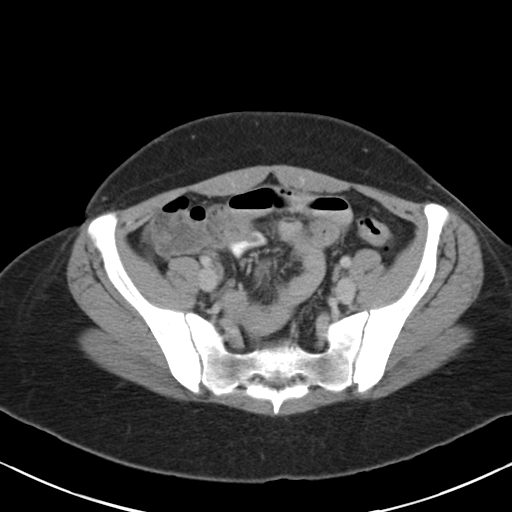
[im 37/85  soft-tissue]
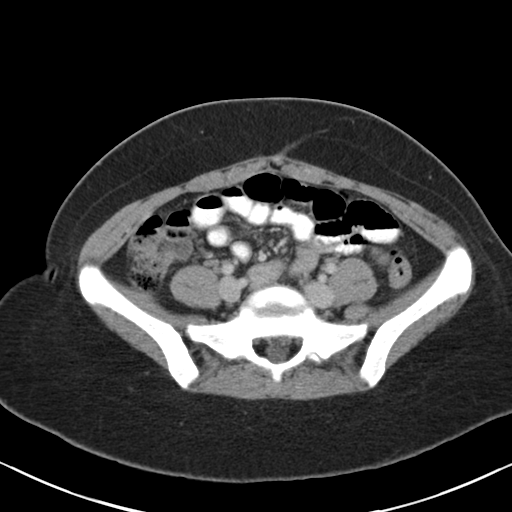
[im 44/85  soft-tissue]
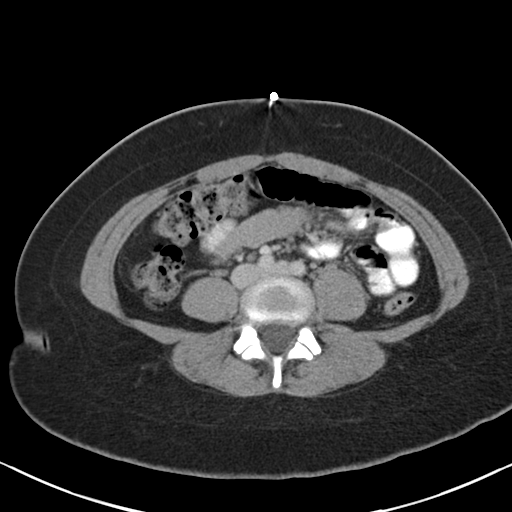
[im 48/85  soft-tissue]
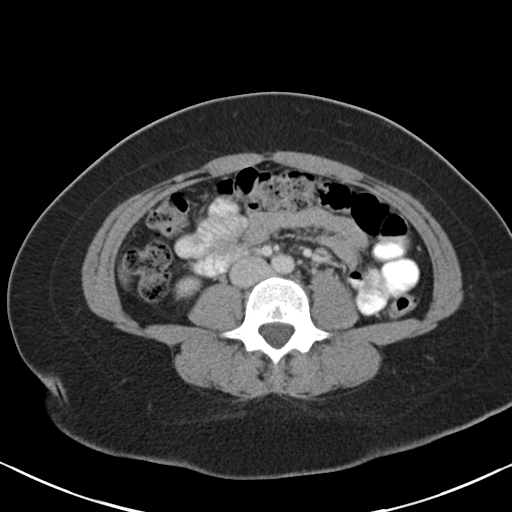
[im 54/85  soft-tissue]
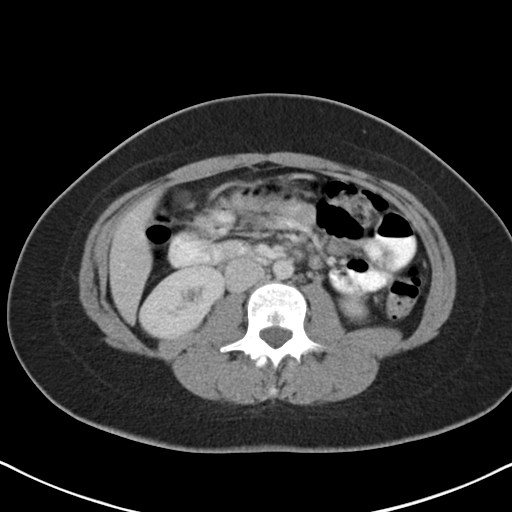
[im 54/85  bone]
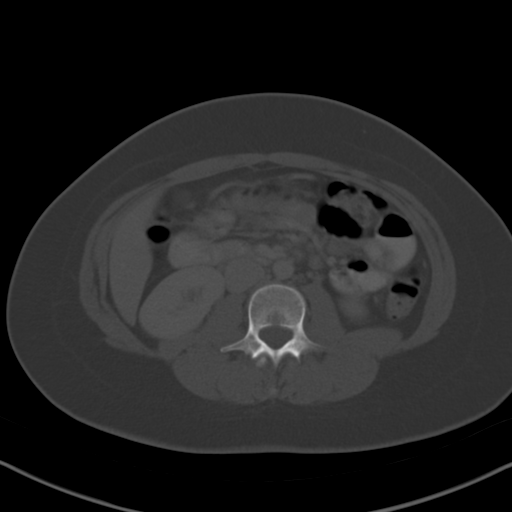
[im 61/85  soft-tissue]
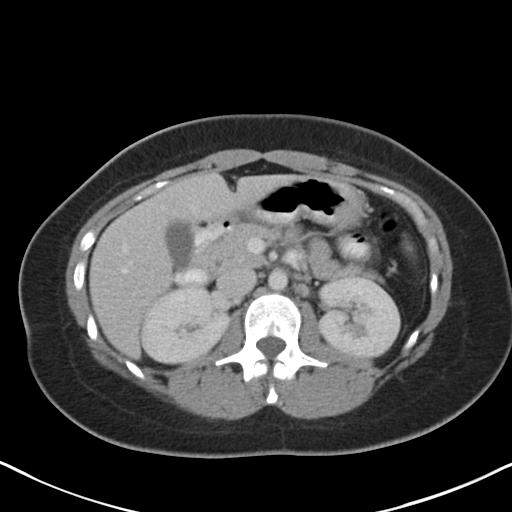
[im 68/85  soft-tissue]
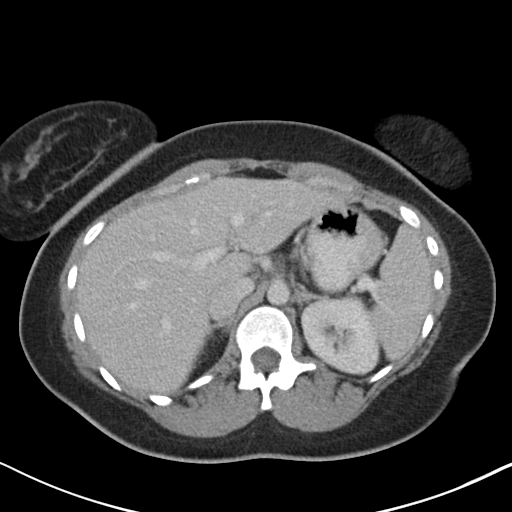
[im 71/85  lung]
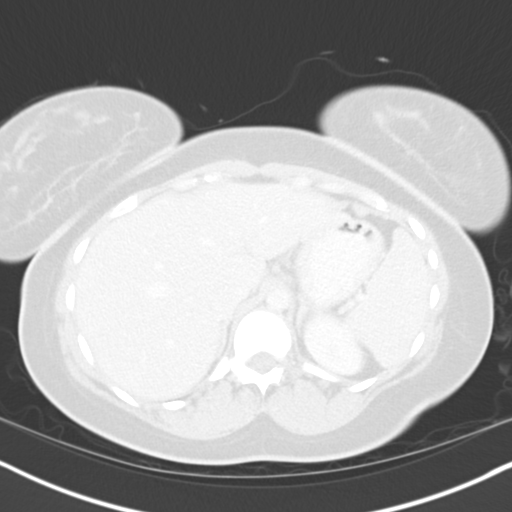
[im 74/85  soft-tissue]
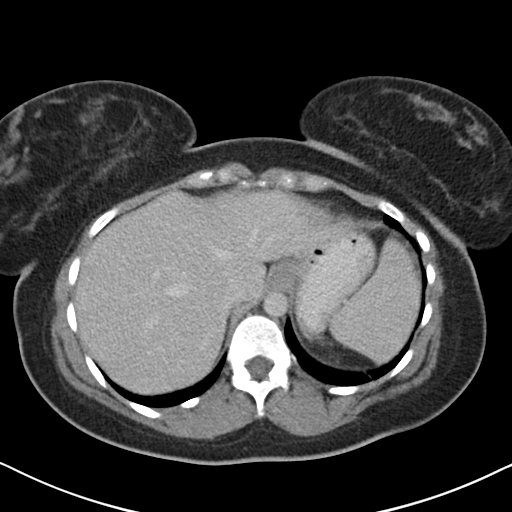
[im 74/85  lung]
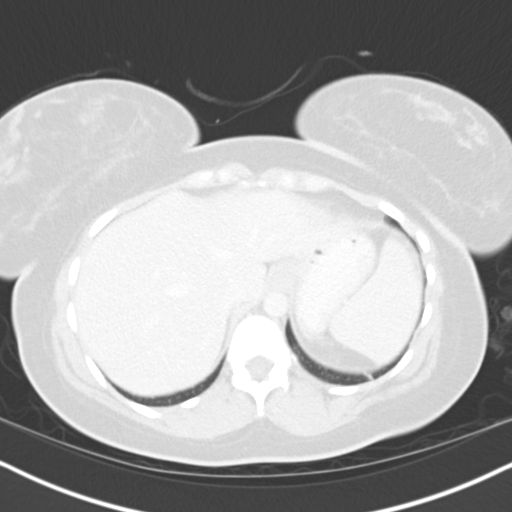
[im 78/85  lung]
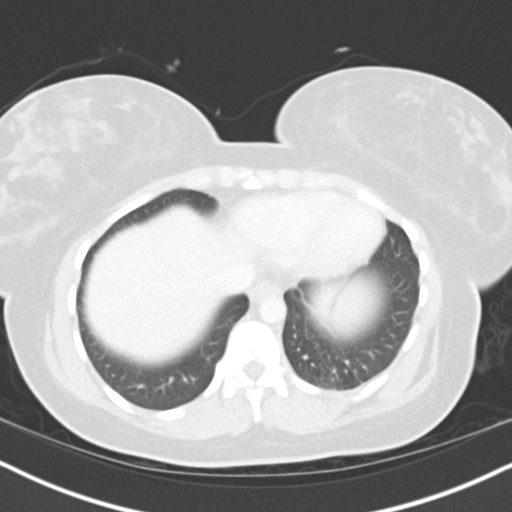
[im 81/85  soft-tissue]
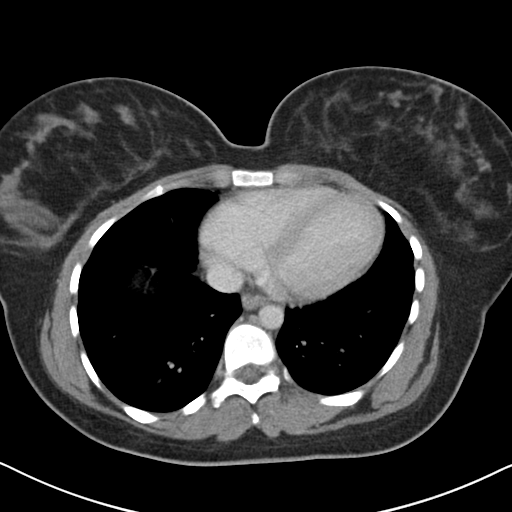
[im 81/85  lung]
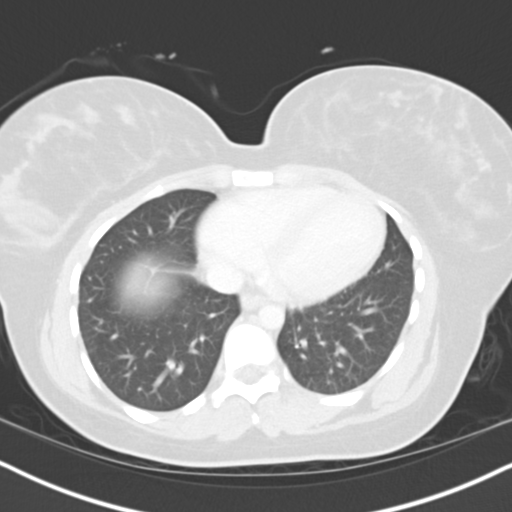

[15 of 32 positions shown; findings below may reference images not displayed]

FINDINGS: Lower chest: Minimal linear scarring or atelectasis in the left
posterior base.

Hepatobiliary: There are normal appearances of the liver,
gallbladder and bile ducts.

Pancreas: Normal

Spleen: Normal

Adrenals/Urinary Tract: The adrenals and kidneys are normal in
appearance. There is no urinary calculus evident. There is no
hydronephrosis or ureteral dilatation. Collecting systems and
ureters appear unremarkable.

Stomach/Bowel: The stomach, small bowel and colon appear normal.

Vascular/Lymphatic: The abdominal aorta is normal in caliber. There
is no atherosclerotic calcification. There is no adenopathy in the
abdomen or pelvis.

Reproductive: There is a 2.2 cm myometrial mass in the anterior
uterine mid body to the left of midline, presumably a fibroid. There
are otherwise normal appearances of the uterus and ovaries.

Other: No ascites. No acute inflammatory changes are evident in the
abdomen or pelvis.

Musculoskeletal: No significant abnormalities
IMPRESSION: 2.2 cm presumed uterine fibroid in the anterior mid body to the left
of midline. No acute findings are evident in the abdomen or pelvis.

## 2015-06-14 ENCOUNTER — Emergency Department (HOSPITAL_COMMUNITY)
Admission: EM | Admit: 2015-06-14 | Discharge: 2015-06-15 | Disposition: A | Discharge status: Home / Self Care | Payer: Self-pay | Attending: Emergency Medicine | Admitting: Emergency Medicine

## 2015-06-14 ENCOUNTER — Encounter (HOSPITAL_COMMUNITY): Payer: Self-pay | Admitting: *Deleted

## 2015-06-14 DIAGNOSIS — J029 Acute pharyngitis, unspecified: Secondary | ICD-10-CM | POA: Insufficient documentation

## 2015-06-14 DIAGNOSIS — R0981 Nasal congestion: Secondary | ICD-10-CM | POA: Insufficient documentation

## 2015-06-14 DIAGNOSIS — R05 Cough: Secondary | ICD-10-CM | POA: Insufficient documentation

## 2015-06-14 NOTE — ED Notes (Signed)
Pt states that she has had cough and congestion since Sunday; pt c/o sore throat; states "It burns to my throat"; pt states it hurts to swallow but is able to swallow; pt states that she has a productive with yellowish green sputum

## 2015-06-15 LAB — RAPID STREP SCREEN (MED CTR MEBANE ONLY): Streptococcus, Group A Screen (Direct): NEGATIVE

## 2015-06-15 NOTE — ED Notes (Signed)
Pt advised registration that she is leaving and no longer wants to wait to be seen; pt left from waiting room

## 2015-06-17 LAB — CULTURE, GROUP A STREP (THRC)

## 2015-07-01 ENCOUNTER — Encounter (HOSPITAL_COMMUNITY): Payer: Self-pay

## 2015-07-01 ENCOUNTER — Inpatient Hospital Stay (HOSPITAL_COMMUNITY)
Admission: AD | Admit: 2015-07-01 | Discharge: 2015-07-02 | Disposition: A | Discharge status: Home / Self Care | Payer: Self-pay | Source: Ambulatory Visit | Attending: Family Medicine | Admitting: Family Medicine

## 2015-07-01 DIAGNOSIS — A0819 Acute gastroenteropathy due to other small round viruses: Secondary | ICD-10-CM | POA: Insufficient documentation

## 2015-07-01 DIAGNOSIS — R3915 Urgency of urination: Secondary | ICD-10-CM | POA: Insufficient documentation

## 2015-07-01 DIAGNOSIS — A0811 Acute gastroenteropathy due to Norwalk agent: Secondary | ICD-10-CM

## 2015-07-01 DIAGNOSIS — R079 Chest pain, unspecified: Secondary | ICD-10-CM | POA: Insufficient documentation

## 2015-07-01 LAB — URINE MICROSCOPIC-ADD ON

## 2015-07-01 LAB — URINALYSIS, ROUTINE W REFLEX MICROSCOPIC
Bilirubin Urine: NEGATIVE
Glucose, UA: NEGATIVE mg/dL
KETONES UR: NEGATIVE mg/dL
Nitrite: NEGATIVE
PROTEIN: NEGATIVE mg/dL
Specific Gravity, Urine: 1.01 (ref 1.005–1.030)
pH: 6.5 (ref 5.0–8.0)

## 2015-07-01 LAB — POCT PREGNANCY, URINE: Preg Test, Ur: NEGATIVE

## 2015-07-01 NOTE — MAU Note (Signed)
Low abd pain x 3 days, constant.  Mid chest pain since Monday, feels sore and when I move a certain way, a sharp pain comes.  Increased bowel movements sometimes watery x 2 days.

## 2015-07-02 DIAGNOSIS — A0811 Acute gastroenteropathy due to Norwalk agent: Secondary | ICD-10-CM

## 2015-07-02 LAB — CBC
HEMATOCRIT: 33.4 % — AB (ref 36.0–46.0)
Hemoglobin: 11.4 g/dL — ABNORMAL LOW (ref 12.0–15.0)
MCH: 28.6 pg (ref 26.0–34.0)
MCHC: 34.1 g/dL (ref 30.0–36.0)
MCV: 83.9 fL (ref 78.0–100.0)
PLATELETS: 347 10*3/uL (ref 150–400)
RBC: 3.98 MIL/uL (ref 3.87–5.11)
RDW: 14 % (ref 11.5–15.5)
WBC: 9.9 10*3/uL (ref 4.0–10.5)

## 2015-07-02 NOTE — Discharge Instructions (Signed)
Norovirus Infection A norovirus infection is caused by exposure to a virus in a group of similar viruses (noroviruses). This type of infection causes inflammation in your stomach and intestines (gastroenteritis). Norovirus is the most common cause of gastroenteritis. It also causes food poisoning. Anyone can get a norovirus infection. It spreads very easily (contagious). You can get it from contaminated food, water, surfaces, or other people. Norovirus is found in the stool or vomit of infected people. You can spread the infection as soon as you feel sick until 2 weeks after you recover.  Symptoms usually begin within 2 days after you become infected. Most norovirus symptoms affect the digestive system. CAUSES Norovirus infection is caused by contact with norovirus. You can catch norovirus if you:  Eat or drink something contaminated with norovirus.  Touch surfaces or objects contaminated with norovirus and then put your hand in your mouth.  Have direct contact with an infected person who has symptoms.  Share food, drink, or utensils with someone with who is sick with norovirus. SIGNS AND SYMPTOMS Symptoms of norovirus may include:  Nausea.  Vomiting.  Diarrhea.  Stomach cramps.  Fever.  Chills.  Headache.  Muscle aches.  Tiredness. DIAGNOSIS Your health care provider may suspect norovirus based on your symptoms and physical exam. Your health care provider may also test a sample of your stool or vomit for the virus.  TREATMENT There is no specific treatment for norovirus. Most people get better without treatment in about 2 days. HOME CARE INSTRUCTIONS  Replace lost fluids by drinking plenty of water or rehydration fluids containing important minerals called electrolytes. This prevents dehydration. Drink enough fluid to keep your urine clear or pale yellow.  Do not prepare food for others while you are infected. Wait at least 3 days after recovering from the illness to do  that. PREVENTION   Wash your hands often, especially after using the toilet or changing a diaper.  Wash fruits and vegetables thoroughly before preparing or serving them.  Throw out any food that a sick person may have touched.  Disinfect contaminated surfaces immediately after someone in the household has been sick. Use a bleach-based household cleaner.  Immediately remove and wash soiled clothes or sheets. SEEK MEDICAL CARE IF:  Your vomiting, diarrhea, and stomach pain is getting worse.  Your symptoms of norovirus do not go away after 2-3 days. SEEK IMMEDIATE MEDICAL CARE IF:  You develop symptoms of dehydration that do not improve with fluid replacement. This may include:  Excessive sleepiness.  Lack of tears.  Dry mouth.  Dizziness when standing.  Weak pulse.   This information is not intended to replace advice given to you by your health care provider. Make sure you discuss any questions you have with your health care provider.   Document Released: 07/14/2002 Document Revised: 05/14/2014 Document Reviewed: 10/01/2013 Elsevier Interactive Patient Education 2016 [employer]  Viral Gastroenteritis Viral gastroenteritis is also called stomach flu. This illness is caused by a certain type of germ (virus). It can cause sudden watery poop (diarrhea) and throwing up (vomiting). This can cause you to lose body fluids (dehydration). This illness usually lasts for 3 to 8 days. It usually goes away on its own. HOME CARE   Drink enough fluids to keep your pee (urine) clear or pale yellow. Drink small amounts of fluids often.  Ask your doctor how to replace body fluid losses (rehydration).  Avoid:  Foods high in sugar.  Alcohol.  Bubbly (carbonated) drinks.  Tobacco.  Juice.  Caffeine drinks.  Very hot or cold fluids.  Fatty, greasy foods.  Eating too much at one time.  Dairy products until 24 to 48 hours after your watery poop stops.  You may eat foods  with active cultures (probiotics). They can be found in some yogurts and supplements.  Wash your hands well to avoid spreading the illness.  Only take medicines as told by your doctor. Do not give aspirin to children. Do not take medicines for watery poop (antidiarrheals).  Ask your doctor if you should keep taking your regular medicines.  Keep all doctor visits as told. GET HELP RIGHT AWAY IF:   You cannot keep fluids down.  You do not pee at least once every 6 to 8 hours.  You are short of breath.  You see blood in your poop or throw up. This may look like coffee grounds.  You have belly (abdominal) pain that gets worse or is just in one small spot (localized).  You keep throwing up or having watery poop.  You have a fever.  The patient is a child younger than 3 months, and he or she has a fever.  The patient is a child older than 3 months, and he or she has a fever and problems that do not go away.  The patient is a child older than 3 months, and he or she has a fever and problems that suddenly get worse.  The patient is a baby, and he or she has no tears when crying. MAKE SURE YOU:   Understand these instructions.  Will watch your condition.  Will get help right away if you are not doing well or get worse.   This information is not intended to replace advice given to you by your health care provider. Make sure you discuss any questions you have with your health care provider.   Document Released: 10/10/2007 Document Revised: 07/16/2011 Document Reviewed: 02/07/2011 Elsevier Interactive Patient Education [employer].

## 2015-07-02 NOTE — MAU Provider Note (Signed)
History     CSN: [id number]  Arrival date and time: 07/01/15 2248   First Provider Initiated Contact with Patient 07/01/15 2357      Chief Complaint  Patient presents with  . Abdominal Pain  . Chest Pain   HPI   [name] is a 25 y.o. female [id number] presenting to MAU with abdominal pain that started on Tuesday. The pain is located in the upper and lower part of her stomach. The pain is constant; the pain is rated at a 9. She has tried ibuprofen with some relief.   Everyone at her job is sick with the same symptoms; abdominal cramping and diarrhea.   She has had 5-6 watery diarrhea episodes daily for the last 2 days.   OB History    Gravida Para Term Preterm AB TAB SAB Ectopic Multiple Living   1    1  1    0      Past Medical History  Diagnosis Date  . UTI (urinary tract infection)   . [id number])     Past Surgical History  Procedure Laterality Date  . No past surgeries      Family History  Problem Relation Age of Onset  . Hypertension Father     Social History  Substance Use Topics  . Smoking status: Never Smoker   . Smokeless tobacco: Never Used  . Alcohol Use: Yes     Comment: once a month    Allergies: No Known Allergies  Prescriptions prior to admission  Medication Sig Dispense Refill Last Dose  . ibuprofen (ADVIL,MOTRIN) 200 MG tablet Take 600 mg by mouth every 6 (six) hours as needed for moderate pain (pain).   06/30/2015 at Unknown time  . Homeopathic Products (HYLAFEM) SUPP Place 1 suppository vaginally Nightly. (Patient not taking: Reported on 06/14/2015) 3 suppository 0 Not Taking at Unknown time  . loratadine-pseudoephedrine (CLARITIN-D 12 HOUR) 5-120 MG per tablet Take 1 tablet by mouth 2 (two) times daily. (Patient not taking: Reported on 06/14/2015) 20 tablet 0 Not Taking at Unknown time   Results for orders placed or performed during the hospital encounter of 07/01/15 (from the past 48 hour(s))  Urinalysis, Routine w reflex  microscopic (not at [facility]     Status: Abnormal   Collection Time: 07/01/15 10:55 PM  Result Value Ref Range   Color, Urine YELLOW YELLOW   APPearance CLEAR CLEAR   Specific Gravity, Urine 1.010 1.005 - 1.030   pH 6.5 5.0 - 8.0   Glucose, UA NEGATIVE NEGATIVE mg/dL   Hgb urine dipstick MODERATE (A) NEGATIVE   Bilirubin Urine NEGATIVE NEGATIVE   Ketones, ur NEGATIVE NEGATIVE mg/dL   Protein, ur NEGATIVE NEGATIVE mg/dL   Nitrite NEGATIVE NEGATIVE   Leukocytes, UA SMALL (A) NEGATIVE  Urine microscopic-add on     Status: Abnormal   Collection Time: 07/01/15 10:55 PM  Result Value Ref Range   Squamous Epithelial / LPF 0-5 (A) NONE SEEN   WBC, UA 0-5 0 - 5 WBC/hpf   RBC / HPF 0-5 0 - 5 RBC/hpf   Bacteria, UA RARE (A) NONE SEEN  Pregnancy, urine POC     Status: None   Collection Time: 07/01/15 11:10 PM  Result Value Ref Range   Preg Test, Ur NEGATIVE NEGATIVE    Comment:        THE SENSITIVITY OF THIS METHODOLOGY IS >24 mIU/mL   CBC     Status: Abnormal   Collection Time: 07/02/15 12:21 AM  Result Value  Ref Range   WBC 9.9 4.0 - 10.5 K/uL   RBC 3.98 3.87 - 5.11 MIL/uL   Hemoglobin 11.4 (L) 12.0 - 15.0 g/dL   HCT 33.4 (L) 36.0 - 46.0 %   MCV 83.9 78.0 - 100.0 fL   MCH 28.6 26.0 - 34.0 pg   MCHC 34.1 30.0 - 36.0 g/dL   RDW 14.0 11.5 - 15.5 %   Platelets 347 150 - 400 K/uL    Review of Systems  Constitutional: Negative for fever and chills.  Cardiovascular: Positive for chest pain (upper abdominal pain ).  Gastrointestinal: Positive for abdominal pain, diarrhea (5-6 times per day for 2days. ) and constipation. Negative for nausea and vomiting.  Genitourinary: Positive for urgency and frequency. Negative for dysuria, hematuria and flank pain.  Musculoskeletal: Negative for back pain.   Physical Exam   Blood pressure 144/81, pulse 115, temperature 99.1 F (37.3 C), temperature source Oral, resp. rate 16, last menstrual period 06/13/2015, SpO2 99 %.  Physical Exam   Constitutional: She is oriented to person, place, and time. She appears well-developed and well-nourished.  Non-toxic appearance. She does not have a sickly appearance. She does not appear ill. No distress.  Cardiovascular: Normal rate and normal heart sounds.   Respiratory: Effort normal.  GI: Normal appearance. There is tenderness in the right lower quadrant, epigastric area, periumbilical area and suprapubic area. There is no rigidity, no rebound and no guarding.  Musculoskeletal: Normal range of motion.  Neurological: She is alert and oriented to person, place, and time.  Skin: Skin is warm. She is not diaphoretic.  Psychiatric: Her behavior is normal.    MAU Course  Procedures  None  MDM  Patient declines STD testing today.  Cbc  Assessment and Plan   A:  1. Norovirus     P:  Discharge home in stable condition Ok to take over the counter imodium  If symptoms worsen go to [facility] ED Increase PO fluid intake BRAT diet Work note given  Lezlie Lye, NP 07/02/2015 12:47 AM    Lezlie Lye, NP 07/02/2015 12:10 AM

## 2015-09-11 ENCOUNTER — Emergency Department (HOSPITAL_COMMUNITY)
Admission: EM | Admit: 2015-09-11 | Discharge: 2015-09-11 | Disposition: A | Discharge status: Home / Self Care | Payer: No Typology Code available for payment source | Attending: Emergency Medicine | Admitting: Emergency Medicine

## 2015-09-11 ENCOUNTER — Encounter (HOSPITAL_COMMUNITY): Payer: Self-pay | Admitting: Emergency Medicine

## 2015-09-11 DIAGNOSIS — Z87440 Personal history of urinary (tract) infections: Secondary | ICD-10-CM | POA: Insufficient documentation

## 2015-09-11 DIAGNOSIS — Y998 Other external cause status: Secondary | ICD-10-CM | POA: Insufficient documentation

## 2015-09-11 DIAGNOSIS — S39012A Strain of muscle, fascia and tendon of lower back, initial encounter: Secondary | ICD-10-CM | POA: Insufficient documentation

## 2015-09-11 DIAGNOSIS — Y92410 Unspecified street and highway as the place of occurrence of the external cause: Secondary | ICD-10-CM | POA: Insufficient documentation

## 2015-09-11 DIAGNOSIS — Y9389 Activity, other specified: Secondary | ICD-10-CM | POA: Insufficient documentation

## 2015-09-11 DIAGNOSIS — S3992XA Unspecified injury of lower back, initial encounter: Secondary | ICD-10-CM | POA: Diagnosis present

## 2015-09-11 DIAGNOSIS — S199XXA Unspecified injury of neck, initial encounter: Secondary | ICD-10-CM | POA: Insufficient documentation

## 2015-09-11 MED ORDER — IBUPROFEN 600 MG PO TABS: 600.0000 mg | ORAL_TABLET | Freq: Four times a day (QID) | ORAL | Status: DC | PRN

## 2015-09-11 MED ORDER — CYCLOBENZAPRINE HCL 10 MG PO TABS: 10.0000 mg | ORAL_TABLET | Freq: Two times a day (BID) | ORAL | Status: DC | PRN
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
## 2015-09-11 NOTE — ED Notes (Signed)
Pt now in room.   

## 2015-09-11 NOTE — ED Notes (Signed)
Rob PA at bedside 
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
## 2015-09-11 NOTE — ED Notes (Signed)
Pt denies any change in bowel or bladder function.

## 2015-10-24 ENCOUNTER — Emergency Department (HOSPITAL_COMMUNITY)
Admission: EM | Admit: 2015-10-24 | Discharge: 2015-10-24 | Disposition: A | Discharge status: Home / Self Care | Payer: No Typology Code available for payment source | Attending: Emergency Medicine | Admitting: Emergency Medicine

## 2015-10-24 ENCOUNTER — Encounter (HOSPITAL_COMMUNITY): Payer: Self-pay | Admitting: Emergency Medicine

## 2015-10-24 DIAGNOSIS — K029 Dental caries, unspecified: Secondary | ICD-10-CM | POA: Insufficient documentation

## 2015-10-24 DIAGNOSIS — T85848A Pain due to other internal prosthetic devices, implants and grafts, initial encounter: Secondary | ICD-10-CM

## 2015-10-24 DIAGNOSIS — Z7982 Long term (current) use of aspirin: Secondary | ICD-10-CM | POA: Insufficient documentation

## 2015-10-24 DIAGNOSIS — Z79899 Other long term (current) drug therapy: Secondary | ICD-10-CM | POA: Insufficient documentation

## 2015-10-24 DIAGNOSIS — H9209 Otalgia, unspecified ear: Secondary | ICD-10-CM | POA: Insufficient documentation

## 2015-10-24 MED ORDER — IBUPROFEN 600 MG PO TABS: 600.0000 mg | ORAL_TABLET | Freq: Three times a day (TID) | ORAL | Status: DC

## 2015-10-24 MED ORDER — IBUPROFEN 200 MG PO TABS
600.0000 mg | ORAL_TABLET | Freq: Once | ORAL | Status: AC
  Administered 2015-10-24: 600 mg via ORAL
  Filled 2015-10-24: qty 3

## 2015-10-24 NOTE — ED Notes (Signed)
Pt c/o left upper tooth pain x 2 weeks. Pt states she was seen by dentist on Tuesday and has been taking Amoxicillin and Hydrocodone since that time. Pt states her pain is untouched by Hydrocodone.

## 2015-10-24 NOTE — ED Notes (Signed)
Patient given heat pack 

## 2015-10-24 NOTE — ED Provider Notes (Signed)
CSN: [id number]     Arrival date & time 10/24/15  0128 History   First MD Initiated Contact with Patient 10/24/15 0240     Chief Complaint  Patient presents with  . Dental Pain     (Consider location/radiation/quality/duration/timing/severity/associated sxs/prior Treatment) HPI Comments: This 25 year old female with known dental caries is seen a dentist and then placed on antibiotic and Vicodin.  She had one tooth pulled.  She has an appointment for follow-up on Wednesday for further evaluation and planning for further extractions.  She states she's been taking hydrocodone, amoxicillin, Tylenol, Excedrin without relief of her hand.  She's been trying ice packs with out relief.  Patient is a 25 y.o. female presenting with tooth pain. The history is provided by the patient.  Dental Pain Location:  Upper Upper teeth location:  15/LU 2nd molar, 12/LU 1st bicuspid and 13/LU 2nd bicuspid Quality:  Throbbing Severity:  Mild Onset quality:  Unable to specify Timing:  Constant Progression:  Unchanged Chronicity:  Recurrent Context: dental caries   Previous work-up:  Dental exam Relieved by:  Nothing Worsened by:  Nothing tried Ineffective treatments:  None tried Associated symptoms: no facial pain, no facial swelling, no fever, no gum swelling and no headaches     Past Medical History  Diagnosis Date  . UTI (urinary tract infection)   . [id number])    Past Surgical History  Procedure Laterality Date  . No past surgeries     Family History  Problem Relation Age of Onset  . Hypertension Father    Social History  Substance Use Topics  . Smoking status: Never Smoker   . Smokeless tobacco: Never Used  . Alcohol Use: Yes     Comment: once a month   OB History    Gravida Para Term Preterm AB TAB SAB Ectopic Multiple Living   1    1  1    0     Review of Systems  Constitutional: Negative for fever.  HENT: Positive for dental problem and ear pain. Negative for facial  swelling.   Neurological: Negative for dizziness and headaches.  All other systems reviewed and are negative.     Allergies  Review of patient's allergies indicates no known allergies.  Home Medications   Prior to Admission medications   Medication Sig Start Date End Date Taking? Authorizing Provider  amoxicillin (AMOXIL) 500 MG capsule Take 500 mg by mouth 4 (four) times daily.   Yes Historical Provider, MD  Aspirin-Acetaminophen-Caffeine (EXCEDRIN MIGRAINE PO) Take 2 tablets by mouth as needed (migraine).   Yes Historical Provider, MD  cyclobenzaprine (FLEXERIL) 10 MG tablet Take 1 tablet (10 mg total) by mouth 2 (two) times daily as needed for muscle spasms. 09/11/15  Yes Montine Circle, PA-C  diphenhydramine-acetaminophen (TYLENOL PM) 25-500 MG TABS tablet Take 1 tablet by mouth at bedtime as needed.   Yes Historical Provider, MD  HYDROcodone-acetaminophen (NORCO/VICODIN) 5-325 MG tablet Take 1 tablet by mouth every 4 (four) hours as needed for moderate pain.   Yes Historical Provider, MD  ibuprofen (ADVIL,MOTRIN) 600 MG tablet Take 1 tablet (600 mg total) by mouth 3 (three) times daily. 10/24/15   Junius Creamer, NP   BP 150/103 mmHg  Pulse 84  Temp(Src) 98.3 F (36.8 C) (Oral)  Resp 18  SpO2 98%  LMP 09/23/2015 Physical Exam  Constitutional: She appears well-developed and well-nourished.  HENT:  Head: Normocephalic.  Mouth/Throat:    Eyes: Pupils are equal, round, and reactive to light.  Neck: Normal  range of motion.  Cardiovascular: Normal rate.   Pulmonary/Chest: Effort normal.  Musculoskeletal: Normal range of motion.  Skin: Skin is warm.  Nursing note and vitals reviewed.   ED Course  Procedures (including critical care time) Labs Review Labs Reviewed - No data to display  Imaging Review No results found. I have personally reviewed and evaluated these images and lab results as part of my medical decision-making.   EKG Interpretation None     Patient has  been cautioned about Tylenol use and potential overdose.  She has been given ibuprofen and heat pack and will be reassessed MDM   Final diagnoses:  Dental implant pain, initial encounter         Junius Creamer, NP 10/24/15 [place], DO 10/24/15 [id number]

## 2015-10-24 NOTE — Discharge Instructions (Signed)
Please be very careful with the amount of Tylenol you're consuming in a 24-hour period.  The recommended limit is 3 g in 24 hours your Vicodin has 325 mg of Tylenol per tablet Excedrin and extra strength Tylenol have anywhere from 325 mg to 5 mg per tablet. You can safely take ibuprofen as well.  As your Vicodin for discomfort.  He can apply heat or cold compresses to the area in question, please call your dentist for follow-up today

## 2017-05-07 HISTORY — PX: WISDOM TOOTH EXTRACTION: SHX21

## 2017-12-10 ENCOUNTER — Inpatient Hospital Stay (HOSPITAL_COMMUNITY)
Admission: AD | Admit: 2017-12-10 | Discharge: 2017-12-10 | Disposition: A | Discharge status: Home / Self Care | Payer: Self-pay | Source: Ambulatory Visit | Attending: Family Medicine | Admitting: Family Medicine

## 2017-12-10 DIAGNOSIS — N760 Acute vaginitis: Secondary | ICD-10-CM | POA: Insufficient documentation

## 2017-12-10 DIAGNOSIS — B9689 Other specified bacterial agents as the cause of diseases classified elsewhere: Secondary | ICD-10-CM | POA: Insufficient documentation

## 2017-12-10 LAB — URINALYSIS, ROUTINE W REFLEX MICROSCOPIC
Bacteria, UA: NONE SEEN
Bilirubin Urine: NEGATIVE
Glucose, UA: NEGATIVE mg/dL
Hgb urine dipstick: NEGATIVE
Ketones, ur: NEGATIVE mg/dL
Nitrite: NEGATIVE
PROTEIN: NEGATIVE mg/dL
Specific Gravity, Urine: 1.024 (ref 1.005–1.030)
pH: 7 (ref 5.0–8.0)

## 2017-12-10 LAB — POCT PREGNANCY, URINE: Preg Test, Ur: NEGATIVE

## 2017-12-10 LAB — WET PREP, GENITAL
Sperm: NONE SEEN
Trich, Wet Prep: NONE SEEN
Yeast Wet Prep HPF POC: NONE SEEN

## 2017-12-10 MED ORDER — METRONIDAZOLE 0.75 % VA GEL: 1.0000 | Freq: Every day | VAGINAL | 0 refills | Status: DC

## 2017-12-10 MED ORDER — METRONIDAZOLE 500 MG PO TABS: 500.0000 mg | ORAL_TABLET | Freq: Two times a day (BID) | ORAL | 0 refills | Status: DC

## 2017-12-10 NOTE — MAU Note (Signed)
Urine sent to lab 

## 2017-12-10 NOTE — MAU Provider Note (Addendum)
History     CSN: [id number]  Arrival date and time: 12/10/17 1744   First Provider Initiated Contact with Patient 12/10/17 1954      Chief Complaint  Patient presents with  . Vaginal Discharge   HPI Ms. KAMBER VIGNOLA is a 27 y.o. G1P0010 who presents to MAU today with complaint of discharge with foul odor x 1-2 weeks. She states that it started after her period. It is white. She denies bleeding, pelvic pain, UTI symptoms or fever.   OB History    Gravida  1   Para      Term      Preterm      AB  1   Living  0     SAB  1   TAB      Ectopic      Multiple      Live Births              Past Medical History:  Diagnosis Date  . Headache(784.0)   . UTI (urinary tract infection)     Past Surgical History:  Procedure Laterality Date  . NO PAST SURGERIES      Family History  Problem Relation Age of Onset  . Hypertension Father     Social History   Tobacco Use  . Smoking status: Never Smoker  . Smokeless tobacco: Never Used  Substance Use Topics  . Alcohol use: Yes    Comment: once a month  . Drug use: No    Allergies: No Known Allergies  Medications Prior to Admission  Medication Sig Dispense Refill Last Dose  . amoxicillin (AMOXIL) 500 MG capsule Take 500 mg by mouth 4 (four) times daily.   10/23/2015 at Unknown time  . Aspirin-Acetaminophen-Caffeine (EXCEDRIN MIGRAINE PO) Take 2 tablets by mouth as needed (migraine).   10/23/2015 at Unknown time  . cyclobenzaprine (FLEXERIL) 10 MG tablet Take 1 tablet (10 mg total) by mouth 2 (two) times daily as needed for muscle spasms. 20 tablet 0 Past Week at Unknown time  . diphenhydramine-acetaminophen (TYLENOL PM) 25-500 MG TABS tablet Take 1 tablet by mouth at bedtime as needed.   10/23/2015 at Unknown time  . HYDROcodone-acetaminophen (NORCO/VICODIN) 5-325 MG tablet Take 1 tablet by mouth every 4 (four) hours as needed for moderate pain.   10/23/2015 at Unknown time  . ibuprofen (ADVIL,MOTRIN) 600  MG tablet Take 1 tablet (600 mg total) by mouth 3 (three) times daily. 30 tablet 0     Review of Systems  Constitutional: Negative for fever.  Gastrointestinal: Negative for abdominal pain.  Genitourinary: Positive for vaginal discharge. Negative for dysuria, frequency, urgency and vaginal bleeding.   Physical Exam   Blood pressure 136/80, pulse 81, temperature 98.5 F (36.9 C), resp. rate 16, height 5\' 2"  (1.575 m), weight 187 lb (84.8 kg), last menstrual period 11/21/2017.  Physical Exam  Nursing note and vitals reviewed. Constitutional: She is oriented to person, place, and time. She appears well-developed and well-nourished. No distress.  HENT:  Head: Normocephalic and atraumatic.  Cardiovascular: Normal rate.  Respiratory: Effort normal.  GI: Soft. She exhibits no distension.  Neurological: She is alert and oriented to person, place, and time.  Skin: Skin is warm and dry. No erythema.  Psychiatric: She has a normal mood and affect.    Results for orders placed or performed during the hospital encounter of 12/10/17 (from the past 24 hour(s))  Pregnancy, urine POC     Status: None   Collection  Time: 12/10/17  6:55 PM  Result Value Ref Range   Preg Test, Ur NEGATIVE NEGATIVE  Wet prep, genital     Status: Abnormal   Collection Time: 12/10/17  7:59 PM  Result Value Ref Range   Yeast Wet Prep HPF POC NONE SEEN NONE SEEN   Trich, Wet Prep NONE SEEN NONE SEEN   Clue Cells Wet Prep HPF POC PRESENT (A) NONE SEEN   WBC, Wet Prep HPF POC MODERATE (A) NONE SEEN   Sperm NONE SEEN     MAU Course  Procedures  None  MDM UPT - negative UA, wet prep, GC/Chlamydia today  Patient instructed to self-swab  Assessment and Plan  A: Bacterial vaginosis   P: Discharge home Rx for Flagyl given to patient. Patient does not like the way the Flagyl tastes, so Metrogel given as well as another option, although without insurance this may not be affordable. Patient aware.  Patient  advised to follow-up with [facility] if symptoms persist or worsen Patient may return to MAU as needed or if her condition were to change or worsen  Kerry Hough, PA-C 12/10/2017, 8:20 PM

## 2017-12-10 NOTE — Discharge Instructions (Signed)

## 2017-12-10 NOTE — MAU Note (Signed)
Pt presents to MAU with complaints of foul vaginal discharge since her last cycle. Denies any VB , states she thinks it is BV. Denies any pain

## 2017-12-11 LAB — GC/CHLAMYDIA PROBE AMP (~~LOC~~) NOT AT ARMC
CHLAMYDIA, DNA PROBE: NEGATIVE
NEISSERIA GONORRHEA: NEGATIVE

## 2017-12-22 ENCOUNTER — Encounter (HOSPITAL_COMMUNITY): Payer: Self-pay

## 2017-12-22 ENCOUNTER — Ambulatory Visit (HOSPITAL_COMMUNITY)
Admission: EM | Admit: 2017-12-22 | Discharge: 2017-12-22 | Disposition: A | Discharge status: Home / Self Care | Payer: Self-pay | Attending: Physician Assistant | Admitting: Physician Assistant

## 2017-12-22 DIAGNOSIS — L509 Urticaria, unspecified: Secondary | ICD-10-CM

## 2017-12-22 MED ORDER — HYDROXYZINE HCL 25 MG PO TABS: 25.0000 mg | ORAL_TABLET | Freq: Four times a day (QID) | ORAL | 0 refills | Status: DC

## 2017-12-22 MED ORDER — PREDNISONE 50 MG PO TABS: 50.0000 mg | ORAL_TABLET | Freq: Every day | ORAL | 0 refills | Status: AC

## 2017-12-22 NOTE — Discharge Instructions (Signed)
Start prednisone as directed. Hydroxyzine as directed for itching. If taking hydroxyzine, avoid benadryl as it can interact. Monitor for any other new exposures that could have caused symptoms. Follow up for reevaluation if symptoms not improving. If experiencing swelling of the throat/lips/tongue, trouble breathing, trouble swallowing, leaning forward to breath, drooling, go to the emergency department for further evaluation needed.

## 2017-12-22 NOTE — ED Provider Notes (Signed)
[facility]    CSN: [id number] Arrival date & time: 12/22/17  1421     History   Chief Complaint Chief Complaint  Patient presents with  . Allergic Reaction    HPI Paige Fritz is a 27 y.o. female.   27 year old female comes in for 3 day history of hives. Patient states that she had some food from a restaurant on 3 days ago, and started having pruritic hives to the body. She has no known allergies to food or medicine, but states there were a lot of different spices that may be new to her. She denies any other new exposures. Denies swelling of the throat/lips/tongue, trouble breathing, trouble swallowing, tripoding, drooling, trismus. Denies spreading erythema, increased warmth, fever. Denies pain. States has been taking benadryl with some relief of itching, but has not been able to resolve the rash.      Past Medical History:  Diagnosis Date  . Headache(784.0)   . UTI (urinary tract infection)     There are no active problems to display for this patient.   Past Surgical History:  Procedure Laterality Date  . NO PAST SURGERIES      OB History    Gravida  1   Para      Term      Preterm      AB  1   Living  0     SAB  1   TAB      Ectopic      Multiple      Live Births               Home Medications    Prior to Admission medications   Medication Sig Start Date End Date Taking? Authorizing Provider  Aspirin-Acetaminophen-Caffeine (EXCEDRIN MIGRAINE PO) Take 2 tablets by mouth as needed (migraine).    [provider]  diphenhydramine-acetaminophen (TYLENOL PM) 25-500 MG TABS tablet Take 1 tablet by mouth at bedtime as needed.    [provider]  HYDROcodone-acetaminophen (NORCO/VICODIN) 5-325 MG tablet Take 1 tablet by mouth every 4 (four) hours as needed for moderate pain.    [provider]  hydrOXYzine (ATARAX/VISTARIL) 25 MG tablet Take 1 tablet (25 mg total) by mouth every 6 (six) hours. 12/22/17    Tasia Catchings, Weronika Birch V, PA-C  ibuprofen (ADVIL,MOTRIN) 600 MG tablet Take 1 tablet (600 mg total) by mouth 3 (three) times daily. 10/24/15   Junius Creamer, NP  predniSONE (DELTASONE) 50 MG tablet Take 1 tablet (50 mg total) by mouth daily for 5 days. 12/22/17 12/27/17  Ok Edwards, PA-C    Family History Family History  Problem Relation Age of Onset  . Hypertension Father     Social History Social History   Tobacco Use  . Smoking status: Never Smoker  . Smokeless tobacco: Never Used  Substance Use Topics  . Alcohol use: Yes    Comment: once a month  . Drug use: No     Allergies   Patient has no known allergies.   Review of Systems Review of Systems  Reason unable to perform ROS: See HPI as above.     Physical Exam Triage Vital Signs ED Triage Vitals  Enc Vitals Group     BP 12/22/17 1457 134/80     Pulse Rate 12/22/17 1457 80     Resp 12/22/17 1457 20     Temp 12/22/17 1457 97.6 F (36.4 C)     Temp Source 12/22/17 1457 Temporal  SpO2 12/22/17 1457 100 %     Weight --      Height --      Head Circumference --      Peak Flow --      Pain Score 12/22/17 1454 0     Pain Loc --      Pain Edu? --      Excl. in [place]? --    No data found.  Updated Vital Signs BP 134/80 (BP Location: Left Arm)   Pulse 80   Temp 97.6 F (36.4 C) (Temporal)   Resp 20   LMP 12/19/2017   SpO2 100%   Visual Acuity Right Eye Distance:   Left Eye Distance:   Bilateral Distance:    Right Eye Near:   Left Eye Near:    Bilateral Near:     Physical Exam  Constitutional: She is oriented to person, place, and time. She appears well-developed and well-nourished. No distress.  HENT:  Head: Normocephalic and atraumatic.  Mouth/Throat: Uvula is midline, oropharynx is clear and moist and mucous membranes are normal. No trismus in the jaw. No uvula swelling. No posterior oropharyngeal edema or posterior oropharyngeal erythema. No tonsillar exudate.  Eyes: Pupils are equal, round, and reactive to  light. Conjunctivae are normal.  Neck: Normal range of motion. Neck supple.  Cardiovascular: Normal rate, regular rhythm and normal heart sounds. Exam reveals no gallop and no friction rub.  No murmur heard. Pulmonary/Chest: Effort normal and breath sounds normal. No accessory muscle usage or stridor. No respiratory distress. She has no decreased breath sounds. She has no wheezes. She has no rhonchi. She has no rales.  Neurological: She is alert and oriented to person, place, and time.  Skin: Skin is warm and dry. She is not diaphoretic.  Diffuse maculopapular rash to the face, trunk, extremities. No erythema, warmth. No tenderness to palpation.     UC Treatments / Results  Labs (all labs ordered are listed, but only abnormal results are displayed) Labs Reviewed - No data to display  EKG None  Radiology No results found.  Procedures Procedures (including critical care time)  Medications Ordered in UC Medications - No data to display  Initial Impression / Assessment and Plan / UC Course  I have reviewed the triage vital signs and the nursing notes.  Pertinent labs & imaging results that were available during my care of the patient were reviewed by me and considered in my medical decision making (see chart for details).    Prednisone as directed. Other symptomatic treatment discussed. Return precautions given. Patient expresses understanding and agrees to plan.  Final Clinical Impressions(s) / UC Diagnoses   Final diagnoses:  Hives    ED Prescriptions    Medication Sig Dispense Auth. Provider   predniSONE (DELTASONE) 50 MG tablet Take 1 tablet (50 mg total) by mouth daily for 5 days. 5 tablet Anshi Jalloh V, PA-C   hydrOXYzine (ATARAX/VISTARIL) 25 MG tablet Take 1 tablet (25 mg total) by mouth every 6 (six) hours. 16 tablet Tobin Chad, [place] 12/22/17 1521

## 2017-12-22 NOTE — ED Triage Notes (Signed)
Pt presents with allergic reaction to some food she ate

## 2018-08-07 ENCOUNTER — Other Ambulatory Visit: Payer: Self-pay

## 2018-08-07 ENCOUNTER — Emergency Department (HOSPITAL_COMMUNITY)
Admission: EM | Admit: 2018-08-07 | Discharge: 2018-08-07 | Disposition: A | Discharge status: Home / Self Care | Payer: Self-pay | Attending: Emergency Medicine | Admitting: Emergency Medicine

## 2018-08-07 ENCOUNTER — Encounter (HOSPITAL_COMMUNITY): Payer: Self-pay

## 2018-08-07 DIAGNOSIS — R509 Fever, unspecified: Secondary | ICD-10-CM

## 2018-08-07 DIAGNOSIS — N898 Other specified noninflammatory disorders of vagina: Secondary | ICD-10-CM

## 2018-08-07 DIAGNOSIS — K5900 Constipation, unspecified: Secondary | ICD-10-CM | POA: Insufficient documentation

## 2018-08-07 DIAGNOSIS — R5383 Other fatigue: Secondary | ICD-10-CM | POA: Insufficient documentation

## 2018-08-07 HISTORY — DX: Anemia, unspecified: D64.9

## 2018-08-07 LAB — URINALYSIS, ROUTINE W REFLEX MICROSCOPIC
Bacteria, UA: NONE SEEN
Bilirubin Urine: NEGATIVE
Glucose, UA: NEGATIVE mg/dL
Hgb urine dipstick: NEGATIVE
Ketones, ur: 20 mg/dL — AB
Nitrite: NEGATIVE
Protein, ur: NEGATIVE mg/dL
Specific Gravity, Urine: 1.016 (ref 1.005–1.030)
pH: 5 (ref 5.0–8.0)

## 2018-08-07 LAB — WET PREP, GENITAL
Clue Cells Wet Prep HPF POC: NONE SEEN
Sperm: NONE SEEN
Trich, Wet Prep: NONE SEEN
WBC, Wet Prep HPF POC: NONE SEEN
Yeast Wet Prep HPF POC: NONE SEEN

## 2018-08-07 LAB — POC URINE PREG, ED: Preg Test, Ur: NEGATIVE

## 2018-08-07 MED ORDER — CEFTRIAXONE SODIUM 250 MG IJ SOLR
250.0000 mg | Freq: Once | INTRAMUSCULAR | Status: AC
  Administered 2018-08-07: 21:00:00 250 mg via INTRAMUSCULAR
  Filled 2018-08-07: qty 250

## 2018-08-07 MED ORDER — AZITHROMYCIN 250 MG PO TABS
1000.0000 mg | ORAL_TABLET | Freq: Once | ORAL | Status: AC
  Administered 2018-08-07: 21:00:00 1000 mg via ORAL
  Filled 2018-08-07: qty 4

## 2018-08-07 MED ORDER — ACETAMINOPHEN 500 MG PO TABS
1000.0000 mg | ORAL_TABLET | Freq: Once | ORAL | Status: AC
  Administered 2018-08-07: 18:00:00 1000 mg via ORAL
  Filled 2018-08-07: qty 2

## 2018-08-07 MED ORDER — METRONIDAZOLE 500 MG PO TABS: 500.0000 mg | ORAL_TABLET | Freq: Two times a day (BID) | ORAL | 0 refills | Status: AC

## 2018-08-07 MED ORDER — STERILE WATER FOR INJECTION IJ SOLN
INTRAMUSCULAR | Status: AC
  Administered 2018-08-07: 21:00:00 10 mL
  Filled 2018-08-07: qty 10

## 2018-08-07 NOTE — ED Notes (Signed)
Pt ambulated to the bathroom with no assistance. Gait steady. Urine and culture sent to lab

## 2018-08-07 NOTE — ED Provider Notes (Addendum)
[facility] DEPT Provider Note   CSN: [id number] Arrival date & time: 08/07/18  1722    History   Chief Complaint Chief Complaint  Patient presents with  . Fever  . Vaginal Discharge    HPI Paige Fritz is a 28 y.o. female.     HPI   Pt is a 28 y/o female with a h/o HA, UTI who presents to the ED today c/o chills and fatigue that began earlier today.  She was unaware of fevers until she cam eto the ED. No rhinorrhea, congestion, sore throat, cough, chest pain, sob, abd pain, body aches, nvd. Has had constipation as she recently restarted taking iron pills. No vaginal bleeding. Does report vaginal discahrge. States she frequently gets BV after her menstrual cycle. States that her pcp has prescribed her flagyl in that past because of her frequent episodes of this and she had some leftover flagyl that she has taken for the last few days without resolution of symptoms.   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 Denies known [place] exposures.  Past Medical History:  Diagnosis Date  . Anemia   . Headache(784.0)   . UTI (urinary tract infection)     There are no active problems to display for this patient.   Past Surgical History:  Procedure Laterality Date  . NO PAST SURGERIES       OB History    Gravida  1   Para      Term      Preterm      AB  1   Living  0     SAB  1   TAB      Ectopic      Multiple      Live Births               Home Medications    Prior to Admission medications   Medication Sig Start Date End Date Taking? Authorizing Provider  hydrOXYzine (ATARAX/VISTARIL) 25 MG tablet Take 1 tablet (25 mg total) by mouth every 6 (six) hours. Patient not taking: Reported on 08/07/2018 12/22/17   Ok Edwards, PA-C  ibuprofen (ADVIL,MOTRIN) 600 MG tablet Take 1 tablet (600 mg total) by mouth 3 (three) times daily. Patient not taking: Reported on 08/07/2018 10/24/15   Junius Creamer, NP  metroNIDAZOLE (FLAGYL) 500 MG  tablet Take 1 tablet (500 mg total) by mouth 2 (two) times daily for 7 days. 08/07/18 08/14/18  Destinae Neubecker S, PA-C    Family History Family History  Problem Relation Age of Onset  . Hypertension Father     Social History Social History   Tobacco Use  . Smoking status: Never Smoker  . Smokeless tobacco: Never Used  Substance Use Topics  . Alcohol use: Yes    Comment: once a month  . Drug use: No     Allergies   Patient has no known allergies.   Review of Systems Review of Systems  Constitutional: Positive for appetite change, fatigue and fever.  HENT: Negative for ear pain and sore throat.   Eyes: Negative for visual disturbance.  Respiratory: Negative for cough and shortness of breath.   Cardiovascular: Negative for chest pain.  Gastrointestinal: Negative for abdominal pain, constipation, diarrhea, nausea and vomiting.  Genitourinary: Positive for vaginal discharge. Negative for dysuria, flank pain, hematuria, urgency, vaginal bleeding and vaginal pain.  Musculoskeletal: Negative for back pain.  Skin: Negative for rash.  Neurological: Negative for headaches.  All other  systems reviewed and are negative.   Physical Exam Updated Vital Signs BP 133/79   Pulse 91   Temp 97.6 F (36.4 C) (Oral)   Resp 17   LMP 07/31/2018   SpO2 98%   Physical Exam Vitals signs and nursing note reviewed.  Constitutional:      General: She is not in acute distress.    Appearance: She is well-developed. She is not ill-appearing or toxic-appearing.  HENT:     Head: Normocephalic and atraumatic.  Eyes:     Conjunctiva/sclera: Conjunctivae normal.  Neck:     Musculoskeletal: Neck supple.  Cardiovascular:     Rate and Rhythm: Normal rate and regular rhythm.     Pulses: Normal pulses.     Heart sounds: Normal heart sounds. No murmur.  Pulmonary:     Effort: Pulmonary effort is normal. No respiratory distress.     Breath sounds: Normal breath sounds. No stridor. No wheezing,  rhonchi or rales.  Abdominal:     General: Bowel sounds are normal. There is no distension.     Palpations: Abdomen is soft.     Tenderness: There is no abdominal tenderness. There is no guarding or rebound.  Genitourinary:    Comments: Exam performed by Rodney Booze,  exam chaperoned Date: 08/07/2018 Pelvic exam: normal external genitalia without evidence of trauma. VULVA: normal appearing vulva with no masses, tenderness or lesion. VAGINA: normal appearing vagina with normal color and discharge, no lesions. CERVIX: cervix is friable, cervical motion tenderness absent, cervical os closed, moderate amount of yellow cervical discharge present; Wet prep and DNA probe for chlamydia and GC obtained.   ADNEXA: normal adnexa in size, nontender and no masses UTERUS: uterus is normal size, shape, consistency and nontender.  Skin:    General: Skin is warm and dry.  Neurological:     Mental Status: She is alert.     ED Treatments / Results  Labs (all labs ordered are listed, but only abnormal results are displayed) Labs Reviewed  URINALYSIS, ROUTINE W REFLEX MICROSCOPIC - Abnormal; Notable for the following components:      Result Value   Ketones, ur 20 (*)    Leukocytes,Ua SMALL (*)    All other components within normal limits  WET PREP, GENITAL  URINE CULTURE  POC URINE PREG, ED  GC/CHLAMYDIA PROBE AMP ([place]) NOT AT [facility]    EKG None  Radiology No results found.  Procedures Procedures (including critical care time)  Medications Ordered in ED Medications  acetaminophen (TYLENOL) tablet 1,000 mg (1,000 mg Oral Given 08/07/18 1745)  cefTRIAXone (ROCEPHIN) injection 250 mg (250 mg Intramuscular Given 08/07/18 2038)  azithromycin (ZITHROMAX) tablet 1,000 mg (1,000 mg Oral Given 08/07/18 2037)  sterile water (preservative free) injection (10 mLs  Given 08/07/18 2038)     Initial Impression / Assessment and Plan / ED Course  I have reviewed the triage vital signs and the  nursing notes.  Pertinent labs & imaging results that were available during my care of the patient were reviewed by me and considered in my medical decision making (see chart for details).     Final Clinical Impressions(s) / ED Diagnoses   Final diagnoses:  Vaginal discharge  Fever, unspecified fever cause   Pt is a 28 y/o female with a h/o HA, UTI who presents to the ED today c/o chills and fatigue that began earlier today.  No URI sxs. No GI complaints. Is only c/o vaginal d/c. States she thinks sxs are  due to BV as she has this frequently after her menses. Has taken 2 days of leftover flagyl without resolution of sxs.  No abd ttp. Lungs ctab.  Pelvic exam with moderate yellow discharge on exam. Friable cervix. No CMT. No uterine or adnexal ttp.   Pt overall well appearing. Wet prep is negative. Unclear if this is because pt has partially treated her suspected BV or if she has concurrent STI. Will tx prophylactically with ceftriaxone and azithro. I will provide her with complete course of flagyl as well for potential BV as she does not have full course as home and feels that her sxs are consistent with this. No evidence of PID on exam and abd is soft and nontender. Etiology of her fever is currently unclear as she just started feeling sxs a few hours pta. 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 She does not meet criteria for testing however I did recommend that she self quarantine and her fever may be related to this. She voices understanding of this and is in agreement with plan for quarantine. Return precautions discussed. All questions answered. Pt stable for d/c.  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 Denies known [place] exposures. Paige Fritz was evaluated in Emergency Department on 08/07/2018 for the symptoms described in the history of present illness. She was evaluated in the context of the global COVID-19 pandemic, which necessitated  consideration that the patient might be at risk for infection with the SARS-CoV-2 virus that causes COVID-19. Institutional protocols and algorithms that pertain to the evaluation of patients at risk for COVID-19 are in a state of rapid change based on information released by regulatory bodies including the CDC and federal and state organizations. These policies and algorithms were followed during the patient's care in the ED. She does not meet criteria for testing.   ED Discharge Orders         Ordered    metroNIDAZOLE (FLAGYL) 500 MG tablet  2 times daily     08/07/18 2048           Rodney Booze, PA-C 08/07/18 2055    Bishop Dublin 08/07/18 2055    Dorie Rank, MD 08/11/18 [phone]

## 2018-08-07 NOTE — Discharge Instructions (Addendum)
You have been tested for HIV, syphilis, chlamydia and gonorrhea.  These results will be available in approximately 3 days and you will be contacted by the hospital if the results are positive. Avoid sexual contact until you are aware of the results, and please inform all sexual partners if you test positive for any of these diseases.  A culture was sent of your urine today to determine if there is any bacterial growth. If the results of the culture are positive and you require an antibiotic or a change of your prescribed antibiotic you will be contacted by the hospital. If the results are negative you will not be contacted.  You should be isolated for at least 7 days since the onset of your symptoms AND >72 hours after symptoms resolution (absence of fever without the use of fever reducing medication and improvement in respiratory symptoms), whichever is longer   Please follow up with your primary care provider within 5-7 days for re-evaluation of your symptoms. If you do not have a primary care provider, information for a healthcare clinic has been provided for you to make arrangements for follow up care. Please return to the emergency department for any new or worsening symptoms.

## 2018-08-07 NOTE — ED Notes (Signed)
Pt made aware of need for urine specimen 

## 2018-08-07 NOTE — ED Triage Notes (Signed)
Pt reports she has been having chills and increased vaginal discharge. Pt reports that she gets BV recurrently after having her period which she just came off of. Pt reports taking old prescription of antibiotics for symptoms without relief. Pt also reports hx of anemia.

## 2018-08-07 NOTE — ED Notes (Addendum)
Pt made aware that we need UA sample and given oral fluids. Pt unable to give sample at this time.

## 2018-08-07 NOTE — ED Notes (Signed)
Pt given discharge papers and verbalized understanding of follow up care and prescriptions. Pt A+OX4 and ambulatory upon departure.

## 2018-08-08 LAB — GC/CHLAMYDIA PROBE AMP (~~LOC~~) NOT AT ARMC
Chlamydia: POSITIVE — AB
Neisseria Gonorrhea: NEGATIVE

## 2018-08-09 LAB — URINE CULTURE: Culture: 20000 — AB

## 2018-08-10 ENCOUNTER — Telehealth: Payer: Self-pay | Admitting: Emergency Medicine

## 2018-08-10 NOTE — Telephone Encounter (Signed)
Post ED Visit - Positive Culture Follow-up  Culture report reviewed by antimicrobial stewardship pharmacist: [facility] Team []  Elenor Quinones, Pharm.D. []  Heide Guile, Pharm.D., BCPS AQ-ID []  Parks Neptune, Pharm.D., BCPS []  Alycia Rossetti, Pharm.D., BCPS []  [place], [place].D., BCPS, AAHIVP []  Legrand Como, Pharm.D., BCPS, AAHIVP []  Salome Arnt, PharmD, BCPS []  Johnnette Gourd, PharmD, BCPS []  Hughes Better, PharmD, BCPS []  Leeroy Cha, PharmD []  Laqueta Linden, PharmD, BCPS []  Albertina Parr, PharmD  [facility] Team []  Leodis Sias, PharmD []  Lindell Spar, PharmD []  Royetta Asal, PharmD []  Graylin Shiver, Rph []  Rema Fendt) Glennon Mac, PharmD []  Arlyn Dunning, PharmD []  Netta Cedars, PharmD []  Dia Sitter, PharmD []  Leone Haven, PharmD []  Gretta Arab, PharmD []  Theodis Shove, PharmD []  Peggyann Juba, PharmD [x]  Ulice Dash, PharmD   Positive urine culture Treated with metronidazole, organism sensitive to the same and no further patient follow-up is required at this time.  [place] Aireanna Luellen 08/10/2018, 1:06 PM

## 2018-09-06 ENCOUNTER — Emergency Department (HOSPITAL_COMMUNITY)
Admission: EM | Admit: 2018-09-06 | Discharge: 2018-09-06 | Disposition: A | Discharge status: Home / Self Care | Payer: Self-pay | Attending: Emergency Medicine | Admitting: Emergency Medicine

## 2018-09-06 ENCOUNTER — Encounter (HOSPITAL_COMMUNITY): Payer: Self-pay

## 2018-09-06 ENCOUNTER — Other Ambulatory Visit: Payer: Self-pay

## 2018-09-06 DIAGNOSIS — Z113 Encounter for screening for infections with a predominantly sexual mode of transmission: Secondary | ICD-10-CM | POA: Insufficient documentation

## 2018-09-06 DIAGNOSIS — Z711 Person with feared health complaint in whom no diagnosis is made: Secondary | ICD-10-CM

## 2018-09-06 DIAGNOSIS — N760 Acute vaginitis: Secondary | ICD-10-CM

## 2018-09-06 DIAGNOSIS — B9689 Other specified bacterial agents as the cause of diseases classified elsewhere: Secondary | ICD-10-CM

## 2018-09-06 DIAGNOSIS — N898 Other specified noninflammatory disorders of vagina: Secondary | ICD-10-CM | POA: Insufficient documentation

## 2018-09-06 LAB — WET PREP, GENITAL
Sperm: NONE SEEN
Trich, Wet Prep: NONE SEEN
Yeast Wet Prep HPF POC: NONE SEEN

## 2018-09-06 LAB — URINALYSIS, ROUTINE W REFLEX MICROSCOPIC
Bilirubin Urine: NEGATIVE
Glucose, UA: NEGATIVE mg/dL
Hgb urine dipstick: NEGATIVE
Ketones, ur: NEGATIVE mg/dL
Leukocytes,Ua: NEGATIVE
Nitrite: NEGATIVE
Protein, ur: NEGATIVE mg/dL
Specific Gravity, Urine: 1.027 (ref 1.005–1.030)
pH: 6 (ref 5.0–8.0)

## 2018-09-06 LAB — RAPID HIV SCREEN (HIV 1/2 AB+AG)
HIV 1/2 Antibodies: NONREACTIVE
HIV-1 P24 Antigen - HIV24: NONREACTIVE

## 2018-09-06 LAB — POC URINE PREG, ED: Preg Test, Ur: NEGATIVE

## 2018-09-06 MED ORDER — CEFTRIAXONE SODIUM 250 MG IJ SOLR
250.0000 mg | Freq: Once | INTRAMUSCULAR | Status: AC
  Administered 2018-09-06: 250 mg via INTRAMUSCULAR
  Filled 2018-09-06: qty 250

## 2018-09-06 MED ORDER — METRONIDAZOLE 500 MG PO TABS: 500.0000 mg | ORAL_TABLET | Freq: Two times a day (BID) | ORAL | 0 refills | Status: AC

## 2018-09-06 MED ORDER — LIDOCAINE HCL (PF) 1 % IJ SOLN
2.0000 mL | Freq: Once | INTRAMUSCULAR | Status: AC
  Administered 2018-09-06: 2 mL
  Filled 2018-09-06: qty 30

## 2018-09-06 MED ORDER — AZITHROMYCIN 250 MG PO TABS
1000.0000 mg | ORAL_TABLET | Freq: Once | ORAL | Status: AC
  Administered 2018-09-06: 1000 mg via ORAL
  Filled 2018-09-06: qty 4

## 2018-09-06 NOTE — ED Triage Notes (Signed)
States for about one week clear/white vaginal discharge and wants it checked out.

## 2018-09-06 NOTE — Discharge Instructions (Addendum)
You were given a prescription for antibiotics. Please take the antibiotic prescription fully.   Do not drink alcohol while taking this medication.  You have been tested for HIV, syphilis, chlamydia and gonorrhea.  These results will be available in approximately 3 days and you will be contacted by the hospital if the results are positive. Avoid sexual contact until you are aware of the results, and please inform all sexual partners if you test positive for any of these diseases.  Please follow up with your primary care provider within 5-7 days for re-evaluation of your symptoms. If you do not have a primary care provider, information for a healthcare clinic has been provided for you to make arrangements for follow up care. Please return to the emergency department for any new or worsening symptoms.

## 2018-09-06 NOTE — ED Provider Notes (Signed)
[facility] DEPT Provider Note   CSN: [id number] Arrival date & time: 09/06/18  1824    History   Chief Complaint Chief Complaint  Patient presents with  . Vaginal Discharge    HPI Paige Fritz is a 28 y.o. female.     HPI   Patient is a 28 year old female with a history of anemia, headache, urinary tract infection, who presents to the emergency department today for evaluation of vaginal discharge.  Patient states she began having discharge about 1 week ago.  States she frequently gets BV when she has her menses.  States that discharge is yellow/intermittently gray.  She is also noticed an odor.  She has had some irritation to the vagina but no itching.  No abdominal pain vomiting, diarrhea or fevers.  No urinary complaints.    She was recently seen in the emergency department for evaluation of vaginal discharge.  At that time she was treated for chlamydia/gonorrhea/BV.  States she did not complete the entire course of Flagyl.  Following this, she states she was sexually active however she used protection.  Past Medical History:  Diagnosis Date  . Anemia   . Headache(784.0)   . UTI (urinary tract infection)     There are no active problems to display for this patient.   Past Surgical History:  Procedure Laterality Date  . NO PAST SURGERIES       OB History    Gravida  1   Para      Term      Preterm      AB  1   Living  0     SAB  1   TAB      Ectopic      Multiple      Live Births               Home Medications    Prior to Admission medications   Medication Sig Start Date End Date Taking? Authorizing Provider  ibuprofen (ADVIL) 200 MG tablet Take 200 mg by mouth every 6 (six) hours as needed for moderate pain.   Yes [provider]  hydrOXYzine (ATARAX/VISTARIL) 25 MG tablet Take 1 tablet (25 mg total) by mouth every 6 (six) hours. Patient not taking: Reported on 08/07/2018 12/22/17   Ok Edwards,  PA-C  ibuprofen (ADVIL,MOTRIN) 600 MG tablet Take 1 tablet (600 mg total) by mouth 3 (three) times daily. Patient not taking: Reported on 08/07/2018 10/24/15   Junius Creamer, NP  metroNIDAZOLE (FLAGYL) 500 MG tablet Take 1 tablet (500 mg total) by mouth 2 (two) times daily for 7 days. 09/06/18 09/13/18  Odis Turck S, PA-C    Family History Family History  Problem Relation Age of Onset  . Hypertension Father     Social History Social History   Tobacco Use  . Smoking status: Never Smoker  . Smokeless tobacco: Never Used  Substance Use Topics  . Alcohol use: Yes    Comment: once a month  . Drug use: No     Allergies   Patient has no known allergies.   Review of Systems Review of Systems  Constitutional: Negative for fever.  HENT: Negative for ear pain and sore throat.   Eyes: Negative for visual disturbance.  Respiratory: Negative for cough and shortness of breath.   Cardiovascular: Negative for chest pain.  Gastrointestinal: Negative for abdominal pain, constipation, diarrhea, nausea and vomiting.  Genitourinary: Positive for vaginal discharge. Negative for dysuria, frequency,  hematuria and vaginal bleeding.  Musculoskeletal: Negative for back pain.  Skin: Negative for color change and rash.  Neurological: Negative for headaches.  All other systems reviewed and are negative.    Physical Exam Updated Vital Signs BP 140/88 (BP Location: Left Arm)   Pulse 90   Temp 98.2 F (36.8 C) (Oral)   Resp 18   Ht 5\' 2"  (1.575 m)   Wt 81.6 kg   SpO2 100%   BMI 32.92 kg/m   Physical Exam Vitals signs and nursing note reviewed.  Constitutional:      General: She is not in acute distress.    Appearance: She is well-developed.  HENT:     Head: Normocephalic and atraumatic.  Eyes:     Conjunctiva/sclera: Conjunctivae normal.  Neck:     Musculoskeletal: Neck supple.  Cardiovascular:     Rate and Rhythm: Normal rate and regular rhythm.     Heart sounds: No murmur.   Pulmonary:     Effort: Pulmonary effort is normal. No respiratory distress.     Breath sounds: Normal breath sounds.  Abdominal:     General: Bowel sounds are normal.     Palpations: Abdomen is soft.     Tenderness: There is no abdominal tenderness. There is no guarding or rebound.  Genitourinary:    Comments: Exam performed by Rodney Booze,  exam chaperoned Date: 09/06/2018 Pelvic exam: normal external genitalia without evidence of trauma. VULVA: normal appearing vulva with no masses, tenderness or lesion. VAGINA: normal appearing vagina with normal color, no lesions. Yellow/white discharge. CERVIX: Cervix is erythematous and friable, there is purulent discharge from the cervix, cervical motion tenderness absent, cervical os closed; vaginal discharge is present. Wet prep and DNA probe for chlamydia and GC obtained. ADNEXA: normal adnexa in size, nontender and no masses UTERUS: uterus is normal size, shape, consistency and nontender. Skin:    General: Skin is warm and dry.  Neurological:     Mental Status: She is alert.    ED Treatments / Results  Labs (all labs ordered are listed, but only abnormal results are displayed) Labs Reviewed  WET PREP, GENITAL - Abnormal; Notable for the following components:      Result Value   Clue Cells Wet Prep HPF POC PRESENT (*)    WBC, Wet Prep HPF POC MANY (*)    All other components within normal limits  RAPID HIV SCREEN (HIV 1/2 AB+AG)  URINALYSIS, ROUTINE W REFLEX MICROSCOPIC  RPR  POC URINE PREG, ED  GC/CHLAMYDIA PROBE AMP ([place]) NOT AT [facility]    EKG None  Radiology No results found.  Procedures Procedures (including critical care time)  Medications Ordered in ED Medications  cefTRIAXone (ROCEPHIN) injection 250 mg (250 mg Intramuscular Given 09/06/18 2050)  azithromycin (ZITHROMAX) tablet 1,000 mg (1,000 mg Oral Given 09/06/18 2051)  lidocaine (PF) (XYLOCAINE) 1 % injection 2 mL (2 mLs Other Given 09/06/18 2050)      Initial Impression / Assessment and Plan / ED Course  I have reviewed the triage vital signs and the nursing notes.  Pertinent labs & imaging results that were available during my care of the patient were reviewed by me and considered in my medical decision making (see chart for details).   Final Clinical Impressions(s) / ED Diagnoses   Final diagnoses:  Vaginal discharge  Concern about STD in female without diagnosis   Pt presenting to the ED today c/o vaginal discharge. Has h/o BV as well as STI.  Pelvic exam with erythematous and friable cervix. Discharge present. No CMT to suggest PID.   Patient to be discharged with instructions to follow up with OBGYN. Discussed importance of using protection when sexually active. Pt understands that they have GC/Chlamydia cultures pending and that they will need to inform all sexual partners if results return positive. Pt has been treated prophylacticly with azithromycin and rocephin due to pts history, pelvic exam, and wet prep with increased WBCs. Pt has also been treated with flagyl for Bacterial Vaginosis. Pt has been advised to not drink alcohol while on this medication.    ED Discharge Orders         Ordered    metroNIDAZOLE (FLAGYL) 500 MG tablet  2 times daily     09/06/18 2137           Bishop Dublin 09/06/18 2137    Lacretia Leigh, MD 09/07/18 2242

## 2018-09-07 LAB — RPR: RPR Ser Ql: NONREACTIVE

## 2018-09-08 LAB — GC/CHLAMYDIA PROBE AMP (~~LOC~~) NOT AT ARMC
Chlamydia: NEGATIVE
Neisseria Gonorrhea: NEGATIVE

## 2019-04-01 ENCOUNTER — Other Ambulatory Visit: Payer: Self-pay

## 2019-04-01 ENCOUNTER — Ambulatory Visit: Admission: EM | Admit: 2019-04-01 | Discharge: 2019-04-01 | Discharge status: Against Medical Advice | Payer: Self-pay

## 2019-04-12 ENCOUNTER — Encounter (HOSPITAL_COMMUNITY): Payer: Self-pay

## 2019-04-12 ENCOUNTER — Other Ambulatory Visit: Payer: Self-pay

## 2019-04-12 ENCOUNTER — Ambulatory Visit (HOSPITAL_COMMUNITY)
Admission: EM | Admit: 2019-04-12 | Discharge: 2019-04-12 | Disposition: A | Discharge status: Home / Self Care | Payer: Self-pay | Attending: Family Medicine | Admitting: Family Medicine

## 2019-04-12 DIAGNOSIS — L089 Local infection of the skin and subcutaneous tissue, unspecified: Secondary | ICD-10-CM

## 2019-04-12 MED ORDER — TRIAMCINOLONE ACETONIDE 0.1 % EX CREA: 1.0000 | TOPICAL_CREAM | Freq: Two times a day (BID) | CUTANEOUS | 0 refills | Status: DC

## 2019-04-12 MED ORDER — SULFAMETHOXAZOLE-TRIMETHOPRIM 800-160 MG PO TABS: 1.0000 | ORAL_TABLET | Freq: Two times a day (BID) | ORAL | 0 refills | Status: AC

## 2019-04-12 NOTE — Discharge Instructions (Addendum)
This is an unusual skin infection As I indicated it may be from a bite, however it also has a viral appearance I am prescribing an antibiotic pill and a steroid cream If it worsens, or appreciably changes, return for re check

## 2019-04-12 NOTE — ED Provider Notes (Signed)
[place]    CSN: [id number] Arrival date & time: 04/12/19  1117      History   Chief Complaint Chief Complaint  Patient presents with  . Insect Bite    HPI Paige Fritz is a 28 y.o. female.   HPI   Patient noted a stinging sensation on her leg while at work Friday Now has a large area of rash on leg Reported to work but was told she "brought it from home" No fever or chills Rash is painful  Past Medical History:  Diagnosis Date  . Anemia   . Headache(784.0)   . UTI (urinary tract infection)     There are no active problems to display for this patient.   Past Surgical History:  Procedure Laterality Date  . NO PAST SURGERIES      OB History    Gravida  1   Para      Term      Preterm      AB  1   Living  0     SAB  1   TAB      Ectopic      Multiple      Live Births               Home Medications    Prior to Admission medications   Medication Sig Start Date End Date Taking? Authorizing Provider  sulfamethoxazole-trimethoprim (BACTRIM DS) 800-160 MG tablet Take 1 tablet by mouth 2 (two) times daily for 7 days. 04/12/19 04/19/19  Raylene Everts, MD  triamcinolone cream (KENALOG) 0.1 % Apply 1 application topically 2 (two) times daily. 04/12/19   Raylene Everts, MD    Family History Family History  Problem Relation Age of Onset  . Hypertension Father     Social History Social History   Tobacco Use  . Smoking status: Never Smoker  . Smokeless tobacco: Never Used  Substance Use Topics  . Alcohol use: Yes    Comment: once a month  . Drug use: No     Allergies   Patient has no known allergies.   Review of Systems Review of Systems  Constitutional: Negative for chills and fever.  HENT: Negative for ear pain and sore throat.   Eyes: Negative for pain and visual disturbance.  Respiratory: Negative for cough and shortness of breath.   Cardiovascular: Negative for chest pain and palpitations.   Gastrointestinal: Negative for abdominal pain and vomiting.  Genitourinary: Negative for dysuria and hematuria.  Musculoskeletal: Negative for arthralgias and back pain.  Skin: Positive for color change and rash.  Neurological: Negative for seizures, syncope and headaches.  All other systems reviewed and are negative.    Physical Exam Triage Vital Signs ED Triage Vitals  Enc Vitals Group     BP 04/12/19 1151 106/86     Pulse Rate 04/12/19 1151 99     Resp 04/12/19 1151 18     Temp 04/12/19 1151 98.8 F (37.1 C)     Temp Source 04/12/19 1151 Oral     SpO2 04/12/19 1151 100 %     Weight 04/12/19 1148 179 lb (81.2 kg)     Height --      Head Circumference --      Peak Flow --      Pain Score 04/12/19 1148 6     Pain Loc --      Pain Edu? --      Excl. in [address]? --  No data found.  Updated Vital Signs BP 106/86 (BP Location: Right Arm)   Pulse 99   Temp 98.8 F (37.1 C) (Oral)   Resp 18   Wt 81.2 kg   SpO2 100%   BMI 32.74 kg/m      Physical Exam Constitutional:      General: She is not in acute distress.    Appearance: She is well-developed and normal weight.  HENT:     Head: Normocephalic and atraumatic.  Eyes:     Conjunctiva/sclera: Conjunctivae normal.     Pupils: Pupils are equal, round, and reactive to light.  Neck:     Musculoskeletal: Normal range of motion.  Cardiovascular:     Rate and Rhythm: Normal rate.  Pulmonary:     Effort: Pulmonary effort is normal. No respiratory distress.  Abdominal:     General: There is no distension.     Palpations: Abdomen is soft.  Musculoskeletal: Normal range of motion.  Skin:    General: Skin is warm and dry.     Comments: See photo  Neurological:     Mental Status: She is alert.  Psychiatric:        Mood and Affect: Mood normal.        Behavior: Behavior normal.     Right thigh:     UC Treatments / Results  Labs (all labs ordered are listed, but only abnormal results are displayed) Labs Reviewed  - No data to display  EKG   Radiology No results found.  Procedures Procedures (including critical care time)  Medications Ordered in UC Medications - No data to display  Initial Impression / Assessment and Plan / UC Course  I have reviewed the triage vital signs and the nursing notes.  Pertinent labs & imaging results that were available during my care of the patient were reviewed by me and considered in my medical decision making (see chart for details).     I explained to the patient that this looks like a viral rash.  She feels certain that the sting she fell at work was the onset.  It is more swollen and tender than I have seen and viral/herpetic rashes in the past.  I am going to cover her with antibiotics for possible cellulitis.  She can use cortisone cream for the discomfort on her skin.  Ibuprofen for pain.  Consider antihistamines.  Return if worse instead of better. Final Clinical Impressions(s) / UC Diagnoses   Final diagnoses:  Skin infection     Discharge Instructions     This is an unusual skin infection As I indicated it may be from a bite, however it also has a viral appearance I am prescribing an antibiotic pill and a steroid cream If it worsens, or appreciably changes, return for re check    ED Prescriptions    Medication Sig Dispense Auth. Provider   sulfamethoxazole-trimethoprim (BACTRIM DS) 800-160 MG tablet Take 1 tablet by mouth 2 (two) times daily for 7 days. 14 tablet Raylene Everts, MD   triamcinolone cream (KENALOG) 0.1 % Apply 1 application topically 2 (two) times daily. 15 g Raylene Everts, MD     PDMP not reviewed this encounter.   Raylene Everts, MD 04/12/19 1329

## 2019-04-12 NOTE — ED Triage Notes (Signed)
Pt states she has a spider bite on her right thigh. X 2 days ago.

## 2019-09-28 ENCOUNTER — Ambulatory Visit: Payer: Self-pay | Attending: Internal Medicine

## 2019-09-28 DIAGNOSIS — Z20822 Contact with and (suspected) exposure to covid-19: Secondary | ICD-10-CM

## 2019-09-29 LAB — NOVEL CORONAVIRUS, NAA: SARS-CoV-2, NAA: NOT DETECTED

## 2019-09-29 LAB — SARS-COV-2, NAA 2 DAY TAT

## 2019-11-07 ENCOUNTER — Other Ambulatory Visit: Payer: Self-pay

## 2019-11-07 ENCOUNTER — Encounter (HOSPITAL_COMMUNITY): Payer: Self-pay

## 2019-11-07 ENCOUNTER — Emergency Department (HOSPITAL_COMMUNITY)
Admission: EM | Admit: 2019-11-07 | Discharge: 2019-11-07 | Disposition: A | Discharge status: Home / Self Care | Payer: Self-pay | Attending: Emergency Medicine | Admitting: Emergency Medicine

## 2019-11-07 DIAGNOSIS — K047 Periapical abscess without sinus: Secondary | ICD-10-CM | POA: Insufficient documentation

## 2019-11-07 MED ORDER — AMOXICILLIN 500 MG PO CAPS
1000.0000 mg | ORAL_CAPSULE | Freq: Once | ORAL | Status: AC
  Administered 2019-11-07: 1000 mg via ORAL
  Filled 2019-11-07: qty 2

## 2019-11-07 MED ORDER — HYDROCODONE-ACETAMINOPHEN 5-325 MG PO TABS
1.0000 | ORAL_TABLET | Freq: Four times a day (QID) | ORAL | 0 refills | Status: AC | PRN
Start: 2019-11-07 — End: 2019-11-09

## 2019-11-07 MED ORDER — NAPROXEN 500 MG PO TABS
500.0000 mg | ORAL_TABLET | Freq: Once | ORAL | Status: AC
Start: 2019-11-07 — End: 2019-11-07
  Administered 2019-11-07: 500 mg via ORAL
  Filled 2019-11-07: qty 1

## 2019-11-07 MED ORDER — AMOXICILLIN 500 MG PO CAPS: 1000.0000 mg | ORAL_CAPSULE | Freq: Two times a day (BID) | ORAL | 0 refills | Status: DC

## 2019-11-07 NOTE — Discharge Instructions (Signed)
You are seen today for dental infection.  It is important that you follow-up with the dentist.  We have started you on antibiotics.  If you have any new or worsening symptoms that you must return to the emergency department.  The medication prescribed can make you sleepy or drowsy.  Please do not take it while driving.

## 2019-11-07 NOTE — ED Triage Notes (Signed)
Pt reports R upper dental pain. White exudate noted around tooth. States that it may be her wisdom tooth pushing on her other teeth. States that she has tried ibuprofen, tylenol, and aleve without relief. Started about 3 days ago.

## 2019-11-07 NOTE — ED Provider Notes (Signed)
[facility] DEPT Provider Note   CSN: [id number] Arrival date & time: 11/07/19  0445     History Chief Complaint  Patient presents with  . Dental Pain    Paige Fritz is a 29 y.o. female.  Patient is a 29 year old female with no significant past medical history presenting to the emergency department for dental pain for the past 3 days.  Reports she is having trouble sleeping.  Reports that she is taking over-the-counter anti-inflammatories but they are not helping.  She reports that she sees pus in her gums.  Denies any fever, chills, trouble breathing or swallowing        Past Medical History:  Diagnosis Date  . Anemia   . Headache(784.0)   . UTI (urinary tract infection)     There are no problems to display for this patient.   Past Surgical History:  Procedure Laterality Date  . NO PAST SURGERIES       OB History    Gravida  1   Para      Term      Preterm      AB  1   Living  0     SAB  1   TAB      Ectopic      Multiple      Live Births              Family History  Problem Relation Age of Onset  . Hypertension Father     Social History   Tobacco Use  . Smoking status: Never Smoker  . Smokeless tobacco: Never Used  Substance Use Topics  . Alcohol use: Yes    Comment: once a month  . Drug use: No    Home Medications Prior to Admission medications   Medication Sig Start Date End Date Taking? Authorizing Provider  amoxicillin (AMOXIL) 500 MG capsule Take 2 capsules (1,000 mg total) by mouth 2 (two) times daily. 11/07/19   Alveria Apley, PA-C  HYDROcodone-acetaminophen (NORCO/VICODIN) 5-325 MG tablet Take 1 tablet by mouth every 6 (six) hours as needed for up to 2 days for severe pain. 11/07/19 11/09/19  Madilyn Hook A, PA-C  triamcinolone cream (KENALOG) 0.1 % Apply 1 application topically 2 (two) times daily. 04/12/19   Raylene Everts, MD    Allergies    Patient has no known  allergies.  Review of Systems   Review of Systems  Constitutional: Negative for appetite change, chills and fever.  HENT: Positive for dental problem and facial swelling. Negative for trouble swallowing.   Respiratory: Negative for shortness of breath.   Allergic/Immunologic: Negative for immunocompromised state.  All other systems reviewed and are negative.   Physical Exam Updated Vital Signs BP (!) 157/97 (BP Location: Left Arm)   Pulse 98   Temp 98.4 F (36.9 C) (Oral)   Resp 15   Ht 5\' 2"  (1.575 m)   Wt 81.6 kg   SpO2 100%   BMI 32.92 kg/m   Physical Exam Vitals and nursing note reviewed.  Constitutional:      Appearance: Normal appearance.  HENT:     Head: Normocephalic.     Nose: Nose normal.     Mouth/Throat:     Mouth: Mucous membranes are moist.   Eyes:     Conjunctiva/sclera: Conjunctivae normal.  Pulmonary:     Effort: Pulmonary effort is normal.  Skin:    General: Skin is dry.  Neurological:  Mental Status: She is alert.  Psychiatric:        Mood and Affect: Mood normal.     ED Results / Procedures / Treatments   Labs (all labs ordered are listed, but only abnormal results are displayed) Labs Reviewed - No data to display  EKG None  Radiology No results found.  Procedures Procedures (including critical care time)  Medications Ordered in ED Medications  amoxicillin (AMOXIL) capsule 1,000 mg (1,000 mg Oral Given 11/07/19 0536)  naproxen (NAPROSYN) tablet 500 mg (500 mg Oral Given 11/07/19 0536)    ED Course  I have reviewed the triage vital signs and the nursing notes.  Pertinent labs & imaging results that were available during my care of the patient were reviewed by me and considered in my medical decision making (see chart for details).    MDM Rules/Calculators/A&P                          Based on review of vitals, medical screening exam, lab work and/or imaging, there does not appear to be an acute, emergent etiology for the  patient's symptoms. Counseled pt on good return precautions and encouraged both PCP and ED follow-up as needed.  Prior to discharge, I also discussed incidental imaging findings with patient in detail and advised appropriate, recommended follow-up in detail.  Clinical Impression: 1. Dental infection     Disposition: Discharge  Prior to providing a prescription for a controlled substance, I independently reviewed the patient's recent prescription history on the [facility]. The patient had no recent or regular prescriptions and was deemed appropriate for a brief, less than 3 day prescription of narcotic for acute analgesia.  This note was prepared with assistance of Systems analyst. Occasional wrong-word or sound-a-like substitutions may have occurred due to the inherent limitations of voice recognition software.  Final Clinical Impression(s) / ED Diagnoses Final diagnoses:  Dental infection    Rx / DC Orders ED Discharge Orders         Ordered    amoxicillin (AMOXIL) 500 MG capsule  2 times daily     Discontinue  Reprint     11/07/19 0546    HYDROcodone-acetaminophen (NORCO/VICODIN) 5-325 MG tablet  Every 6 hours PRN     Discontinue  Reprint     11/07/19 0546           Alveria Apley, PA-C 11/07/19 0547    Ripley Fraise, MD 11/07/19 2307

## 2019-11-08 ENCOUNTER — Encounter (HOSPITAL_COMMUNITY): Payer: Self-pay

## 2019-11-08 ENCOUNTER — Other Ambulatory Visit: Payer: Self-pay

## 2019-11-08 ENCOUNTER — Emergency Department (HOSPITAL_COMMUNITY)
Admission: EM | Admit: 2019-11-08 | Discharge: 2019-11-08 | Disposition: A | Discharge status: Home / Self Care | Payer: Self-pay | Attending: Emergency Medicine | Admitting: Emergency Medicine

## 2019-11-08 DIAGNOSIS — G479 Sleep disorder, unspecified: Secondary | ICD-10-CM | POA: Insufficient documentation

## 2019-11-08 DIAGNOSIS — K0889 Other specified disorders of teeth and supporting structures: Secondary | ICD-10-CM | POA: Insufficient documentation

## 2019-11-08 DIAGNOSIS — Z880 Allergy status to penicillin: Secondary | ICD-10-CM | POA: Insufficient documentation

## 2019-11-08 MED ORDER — CHLORHEXIDINE GLUCONATE 0.12 % MT SOLN: 15.0000 mL | Freq: Two times a day (BID) | OROMUCOSAL | 0 refills | Status: DC

## 2019-11-08 NOTE — ED Provider Notes (Signed)
[facility] DEPT Provider Note   CSN: [id number] Arrival date & time: 11/08/19  0343     History Chief Complaint  Patient presents with  . Dental Pain    Paige Fritz is a 29 y.o. female.  Patient presents to the emergency department with a chief complaint of dental pain.  She was seen yesterday for the same.  She reports having had an increase in swelling since being seen yesterday.  She has not seen a dentist yet.  She was started on amoxicillin yesterday.  She denies any fevers or chills.  She states the pain keeps her from sleeping at night.  She has tried taking Tylenol and Motrin.  The history is provided by the patient. No language interpreter was used.       Past Medical History:  Diagnosis Date  . Anemia   . Headache(784.0)   . UTI (urinary tract infection)     There are no problems to display for this patient.   Past Surgical History:  Procedure Laterality Date  . NO PAST SURGERIES       OB History    Gravida  1   Para      Term      Preterm      AB  1   Living  0     SAB  1   TAB      Ectopic      Multiple      Live Births              Family History  Problem Relation Age of Onset  . Hypertension Father     Social History   Tobacco Use  . Smoking status: Never Smoker  . Smokeless tobacco: Never Used  Substance Use Topics  . Alcohol use: Yes    Comment: once a month  . Drug use: No    Home Medications Prior to Admission medications   Medication Sig Start Date End Date Taking? Authorizing Provider  amoxicillin (AMOXIL) 500 MG capsule Take 2 capsules (1,000 mg total) by mouth 2 (two) times daily. 11/07/19   Alveria Apley, PA-C  chlorhexidine (PERIDEX) 0.12 % solution Use as directed 15 mLs in the mouth or throat 2 (two) times daily. 11/08/19   Montine Circle, PA-C  HYDROcodone-acetaminophen (NORCO/VICODIN) 5-325 MG tablet Take 1 tablet by mouth every 6 (six) hours as needed for up to 2  days for severe pain. 11/07/19 11/09/19  Madilyn Hook A, PA-C  triamcinolone cream (KENALOG) 0.1 % Apply 1 application topically 2 (two) times daily. 04/12/19   Raylene Everts, MD    Allergies    Patient has no known allergies.  Review of Systems   Review of Systems  Constitutional: Negative for chills and fever.  HENT: Positive for dental problem. Negative for drooling.   Neurological: Negative for speech difficulty.  Psychiatric/Behavioral: Positive for sleep disturbance.    Physical Exam Updated Vital Signs BP (!) 145/84   Pulse 84   Temp 97.8 F (36.6 C) (Oral)   Resp 19   Ht 5\' 2"  (1.575 m)   Wt 81.6 kg   SpO2 100%   BMI 32.92 kg/m   Physical Exam Physical Exam  Constitutional: Pt appears well-developed and well-nourished.  HENT:  Head: Normocephalic.  Right Ear: Tympanic membrane, external ear and ear canal normal.  Left Ear: Tympanic membrane, external ear and ear canal normal.  Nose: Nose normal. Right sinus exhibits no maxillary sinus tenderness and  no frontal sinus tenderness. Left sinus exhibits no maxillary sinus tenderness and no frontal sinus tenderness.  Mouth/Throat: Uvula is midline, oropharynx is clear and moist and mucous membranes are normal. No oral lesions. No uvula swelling or lacerations. No oropharyngeal exudate, posterior oropharyngeal edema, posterior oropharyngeal erythema or tonsillar abscesses.  Poor dentition No gingival swelling, fluctuance or induration No gross abscess  No sublingual edema, tenderness to palpation, or sign of Ludwig's angina, or deep space infection Pain at right upper rear molars Eyes: Conjunctivae are normal. Pupils are equal, round, and reactive to light. Right eye exhibits no discharge. Left eye exhibits no discharge.  Neck: Normal range of motion. Neck supple.  No stridor Handling secretions without difficulty No nuchal rigidity No cervical lymphadenopathy Cardiovascular: Normal rate, regular rhythm and normal  heart sounds.   Pulmonary/Chest: Effort normal. No respiratory distress.  Equal chest rise  Abdominal: Soft. Bowel sounds are normal. Pt exhibits no distension. There is no tenderness.  Lymphadenopathy: Pt has no cervical adenopathy.  Neurological: Pt is alert and oriented x 4  Skin: Skin is warm and dry.  Psychiatric: Pt has a normal mood and affect.  Nursing note and vitals reviewed.   ED Results / Procedures / Treatments   Labs (all labs ordered are listed, but only abnormal results are displayed) Labs Reviewed - No data to display  EKG None  Radiology No results found.  Procedures Procedures (including critical care time)  Medications Ordered in ED Medications - No data to display  ED Course  I have reviewed the triage vital signs and the nursing notes.  Pertinent labs & imaging results that were available during my care of the patient were reviewed by me and considered in my medical decision making (see chart for details).    MDM Rules/Calculators/A&P                          Patient advised to continue with the amoxicillin.  I also prescribed her Peridex mouth solution.  I do not see any drainable abscess.  I have advised the patient to continue with Tylenol and Motrin.  Recommend dental follow-up. Final Clinical Impression(s) / ED Diagnoses Final diagnoses:  Pain, dental    Rx / DC Orders ED Discharge Orders         Ordered    chlorhexidine (PERIDEX) 0.12 % solution  2 times daily     Discontinue  Reprint     11/08/19 0539           Montine Circle, PA-C 11/08/19 0602    Palumbo, April, MD 11/08/19 9476

## 2019-11-08 NOTE — Discharge Instructions (Signed)
Please continue taking the antibiotic as directed.  See a dentist ASAP.  Use mouthwash as directed.  Don't use if pregnant.

## 2019-11-08 NOTE — ED Triage Notes (Signed)
Right upper dental pain. Seen here yesterday. Pt sts pain is getting worse and she cannot continue taking ibuprofen.

## 2020-04-15 ENCOUNTER — Ambulatory Visit (HOSPITAL_COMMUNITY)
Admission: RE | Admit: 2020-04-15 | Discharge: 2020-04-15 | Disposition: A | Discharge status: Home / Self Care | Payer: Self-pay | Source: Ambulatory Visit | Attending: Internal Medicine | Admitting: Internal Medicine

## 2020-04-15 ENCOUNTER — Other Ambulatory Visit: Payer: Self-pay

## 2020-04-15 ENCOUNTER — Encounter (HOSPITAL_COMMUNITY): Payer: Self-pay

## 2020-04-15 VITALS — BP 120/79 | HR 84 | Temp 98.8°F | Resp 18

## 2020-04-15 DIAGNOSIS — N764 Abscess of vulva: Secondary | ICD-10-CM

## 2020-04-15 MED ORDER — SULFAMETHOXAZOLE-TRIMETHOPRIM 800-160 MG PO TABS: 1.0000 | ORAL_TABLET | Freq: Two times a day (BID) | ORAL | 0 refills | Status: AC

## 2020-04-15 NOTE — ED Triage Notes (Signed)
Pt reports with an abscess between her thigh and vaginal area. Pt states she has had the abscess for almost 1 month. Pt denies fever.

## 2020-04-15 NOTE — ED Provider Notes (Signed)
[facility]    CSN: [id number] Arrival date & time: 04/15/20  1246      History   Chief Complaint Chief Complaint  Patient presents with  . Abscess    HPI Paige Fritz is a 29 y.o. female.   Patient presents with a labial abscess x1 month.  She states it previously opened and drained but has become tender again.  She denies fever, chills, abdominal pain, dysuria, back pain, vaginal discharge, pelvic pain, or other symptoms.  Treatment attempted at home with warm soaks.  Her medical history includes anemia, headache, UTI.  The history is provided by the patient and medical records.    Past Medical History:  Diagnosis Date  . Anemia   . Headache(784.0)   . UTI (urinary tract infection)     There are no problems to display for this patient.   Past Surgical History:  Procedure Laterality Date  . NO PAST SURGERIES      OB History    Gravida  1   Para      Term      Preterm      AB  1   Living  0     SAB  1   IAB      Ectopic      Multiple      Live Births               Home Medications    Prior to Admission medications   Medication Sig Start Date End Date Taking? Authorizing Provider  chlorhexidine (PERIDEX) 0.12 % solution Use as directed 15 mLs in the mouth or throat 2 (two) times daily. 11/08/19   Montine Circle, PA-C  sulfamethoxazole-trimethoprim (BACTRIM DS) 800-160 MG tablet Take 1 tablet by mouth 2 (two) times daily for 7 days. 04/15/20 04/22/20  Sharion Balloon, NP  triamcinolone cream (KENALOG) 0.1 % Apply 1 application topically 2 (two) times daily. 04/12/19   Raylene Everts, MD    Family History Family History  Problem Relation Age of Onset  . Hypertension Father     Social History Social History   Tobacco Use  . Smoking status: Never Smoker  . Smokeless tobacco: Never Used  Substance Use Topics  . Alcohol use: Yes    Comment: once a month  . Drug use: No     Allergies   Patient has no known  allergies.   Review of Systems Review of Systems  Constitutional: Negative for chills and fever.  HENT: Negative for ear pain and sore throat.   Eyes: Negative for pain and visual disturbance.  Respiratory: Negative for cough and shortness of breath.   Cardiovascular: Negative for chest pain and palpitations.  Gastrointestinal: Negative for abdominal pain and vomiting.  Genitourinary: Negative for dysuria and hematuria.  Musculoskeletal: Negative for arthralgias and back pain.  Skin: Positive for wound. Negative for color change and rash.  Neurological: Negative for seizures and syncope.  All other systems reviewed and are negative.    Physical Exam Triage Vital Signs ED Triage Vitals  Enc Vitals Group     BP 04/15/20 1308 120/79     Pulse Rate 04/15/20 1308 84     Resp 04/15/20 1308 18     Temp 04/15/20 1308 98.8 F (37.1 C)     Temp Source 04/15/20 1308 Oral     SpO2 04/15/20 1308 100 %     Weight --      Height --  Head Circumference --      Peak Flow --      Pain Score 04/15/20 1306 4     Pain Loc --      Pain Edu? --      Excl. in [place]? --    No data found.  Updated Vital Signs BP 120/79 (BP Location: Left Arm)   Pulse 84   Temp 98.8 F (37.1 C) (Oral)   Resp 18   LMP 03/30/2020 (Approximate)   SpO2 100%   Visual Acuity Right Eye Distance:   Left Eye Distance:   Bilateral Distance:    Right Eye Near:   Left Eye Near:    Bilateral Near:     Physical Exam Vitals and nursing note reviewed.  Constitutional:      General: She is not in acute distress.    Appearance: She is well-developed and well-nourished. She is not ill-appearing.  HENT:     Head: Normocephalic and atraumatic.     Mouth/Throat:     Mouth: Mucous membranes are moist.  Eyes:     Conjunctiva/sclera: Conjunctivae normal.  Cardiovascular:     Rate and Rhythm: Normal rate and regular rhythm.     Heart sounds: No murmur heard.   Pulmonary:     Effort: Pulmonary effort is  normal. No respiratory distress.     Breath sounds: Normal breath sounds.  Abdominal:     Palpations: Abdomen is soft.     Tenderness: There is no abdominal tenderness. There is no guarding or rebound.  Genitourinary:    Vagina: No vaginal discharge.       Comments: No erythema, open wounds, drainage. No vaginal discharge.  Needle aspiration with blood return only. Musculoskeletal:        General: No edema.     Cervical back: Neck supple.  Skin:    General: Skin is warm and dry.     Findings: No rash.  Neurological:     General: No focal deficit present.     Mental Status: She is alert and oriented to person, place, and time.     Gait: Gait normal.  Psychiatric:        Mood and Affect: Mood and affect and mood normal.        Behavior: Behavior normal.      UC Treatments / Results  Labs (all labs ordered are listed, but only abnormal results are displayed) Labs Reviewed - No data to display  EKG   Radiology No results found.  Procedures Procedures (including critical care time)  Medications Ordered in UC Medications - No data to display  Initial Impression / Assessment and Plan / UC Course  I have reviewed the triage vital signs and the nursing notes.  Pertinent labs & imaging results that were available during my care of the patient were reviewed by me and considered in my medical decision making (see chart for details).   Labial abscess.  Needle aspiration with blood return only.  Treating with Septra DS.  Instructed patient to follow-up with her PCP or OB/GYN if her symptoms are not improving.  Patient agrees to plan of care.   Final Clinical Impressions(s) / UC Diagnoses   Final diagnoses:  Labial abscess     Discharge Instructions     Take the antibiotic as directed.    Follow-up with your primary care provider or gynecologist if your symptoms are not improving.    ED Prescriptions    Medication Sig Dispense Auth. Provider  sulfamethoxazole-trimethoprim (BACTRIM DS) 800-160 MG tablet Take 1 tablet by mouth 2 (two) times daily for 7 days. 14 tablet Sharion Balloon, NP     PDMP not reviewed this encounter.   Sharion Balloon, NP 04/15/20 1421

## 2020-04-15 NOTE — Discharge Instructions (Signed)
Take the antibiotic as directed.  Follow-up with your primary care provider or gynecologist if your symptoms are not improving. 

## 2021-06-12 DIAGNOSIS — Z0289 Encounter for other administrative examinations: Secondary | ICD-10-CM

## 2021-06-21 ENCOUNTER — Encounter (INDEPENDENT_AMBULATORY_CARE_PROVIDER_SITE_OTHER): Payer: Self-pay | Admitting: Family Medicine

## 2021-06-21 ENCOUNTER — Ambulatory Visit (INDEPENDENT_AMBULATORY_CARE_PROVIDER_SITE_OTHER): Payer: Self-pay | Admitting: Family Medicine

## 2021-06-21 ENCOUNTER — Other Ambulatory Visit: Payer: Self-pay

## 2021-06-21 VITALS — BP 117/72 | HR 91 | Temp 98.8°F | Ht 61.0 in | Wt 192.0 lb

## 2021-06-21 DIAGNOSIS — D500 Iron deficiency anemia secondary to blood loss (chronic): Secondary | ICD-10-CM

## 2021-06-21 DIAGNOSIS — Z9189 Other specified personal risk factors, not elsewhere classified: Secondary | ICD-10-CM

## 2021-06-21 DIAGNOSIS — E669 Obesity, unspecified: Secondary | ICD-10-CM

## 2021-06-21 DIAGNOSIS — Z6836 Body mass index (BMI) 36.0-36.9, adult: Secondary | ICD-10-CM

## 2021-06-21 DIAGNOSIS — Z1331 Encounter for screening for depression: Secondary | ICD-10-CM

## 2021-06-21 DIAGNOSIS — R5383 Other fatigue: Secondary | ICD-10-CM

## 2021-06-21 DIAGNOSIS — R0602 Shortness of breath: Secondary | ICD-10-CM

## 2021-06-21 DIAGNOSIS — E559 Vitamin D deficiency, unspecified: Secondary | ICD-10-CM

## 2021-06-21 DIAGNOSIS — E66812 Obesity, class 2: Secondary | ICD-10-CM

## 2021-06-22 LAB — COMPREHENSIVE METABOLIC PANEL
ALT: 5 IU/L (ref 0–32)
AST: 15 IU/L (ref 0–40)
Albumin/Globulin Ratio: 1.6 (ref 1.2–2.2)
Albumin: 4.4 g/dL (ref 3.8–4.8)
Alkaline Phosphatase: 57 IU/L (ref 44–121)
BUN/Creatinine Ratio: 15 (ref 9–23)
BUN: 11 mg/dL (ref 6–20)
Bilirubin Total: <0.2 mg/dL (ref 0.0–1.2)
CO2: 20 mmol/L (ref 20–29)
Calcium: 9.2 mg/dL (ref 8.7–10.2)
Chloride: 101 mmol/L (ref 96–106)
Creatinine, Ser: 0.71 mg/dL (ref 0.57–1.00)
Globulin, Total: 2.8 g/dL (ref 1.5–4.5)
Glucose: 77 mg/dL (ref 70–99)
Potassium: 4.1 mmol/L (ref 3.5–5.2)
Sodium: 134 mmol/L (ref 134–144)
Total Protein: 7.2 g/dL (ref 6.0–8.5)
eGFR: 117 mL/min/{1.73_m2} (ref >59)

## 2021-06-22 LAB — HEMOGLOBIN A1C
Est. average glucose Bld gHb Est-mCnc: 108 mg/dL
Hgb A1c MFr Bld: 5.4 % (ref 4.8–5.6)

## 2021-06-22 LAB — CBC WITH DIFFERENTIAL/PLATELET
Basophils Absolute: 0.1 10*3/uL (ref 0.0–0.2)
Basos: 1 %
EOS (ABSOLUTE): 0.1 10*3/uL (ref 0.0–0.4)
Eos: 1 %
Hematocrit: 28.9 % — ABNORMAL LOW (ref 34.0–46.6)
Hemoglobin: 8.6 g/dL — ABNORMAL LOW (ref 11.1–15.9)
Immature Grans (Abs): 0 10*3/uL (ref 0.0–0.1)
Immature Granulocytes: 0 %
Lymphocytes Absolute: 2.5 10*3/uL (ref 0.7–3.1)
Lymphs: 45 %
MCH: 22.8 pg — ABNORMAL LOW (ref 26.6–33.0)
MCHC: 29.8 g/dL — ABNORMAL LOW (ref 31.5–35.7)
MCV: 77 fL — ABNORMAL LOW (ref 79–97)
Monocytes Absolute: 0.4 10*3/uL (ref 0.1–0.9)
Monocytes: 6 %
Neutrophils Absolute: 2.6 10*3/uL (ref 1.4–7.0)
Neutrophils: 47 %
Platelets: 356 10*3/uL (ref 150–450)
RBC: 3.78 x10E6/uL (ref 3.77–5.28)
RDW: 17.1 % — ABNORMAL HIGH (ref 11.7–15.4)
WBC: 5.5 10*3/uL (ref 3.4–10.8)

## 2021-06-22 LAB — TSH: TSH: 3.39 u[IU]/mL (ref 0.450–4.500)

## 2021-06-22 LAB — LIPID PANEL WITH LDL/HDL RATIO
Cholesterol, Total: 265 mg/dL — ABNORMAL HIGH (ref 100–199)
HDL: 79 mg/dL (ref >39)
LDL Chol Calc (NIH): 172 mg/dL — ABNORMAL HIGH (ref 0–99)
LDL/HDL Ratio: 2.2 ratio (ref 0.0–3.2)
Triglycerides: 82 mg/dL (ref 0–149)
VLDL Cholesterol Cal: 14 mg/dL (ref 5–40)

## 2021-06-22 LAB — VITAMIN D 25 HYDROXY (VIT D DEFICIENCY, FRACTURES): Vit D, 25-Hydroxy: 5.9 ng/mL — ABNORMAL LOW (ref 30.0–100.0)

## 2021-06-22 LAB — T3: T3, Total: 115 ng/dL (ref 71–180)

## 2021-06-22 LAB — FOLATE: Folate: 11.1 ng/mL (ref >3.0)

## 2021-06-22 LAB — T4, FREE: Free T4: 0.91 ng/dL (ref 0.82–1.77)

## 2021-06-22 LAB — INSULIN, RANDOM: INSULIN: 10.7 u[IU]/mL (ref 2.6–24.9)

## 2021-06-22 LAB — VITAMIN B12: Vitamin B-12: 674 pg/mL (ref 232–1245)

## 2021-06-22 NOTE — Progress Notes (Signed)
Chief Complaint:   OBESITY Paige Fritz (MR# [id number]) is a 31 y.o. female who presents for evaluation and treatment of obesity and related comorbidities. Current BMI is Body mass index is 36.28 kg/m. Jaonna has been struggling with her weight for many years and has been unsuccessful in either losing weight, maintaining weight loss, or reaching her healthy weight goal.  Clovis is currently in the action stage of change and ready to dedicate time achieving and maintaining a healthier weight. Milany is interested in becoming our patient and working on intensive lifestyle modifications including (but not limited to) diet and exercise for weight loss.  Pt was told about the clinic by a coworker. She is not taking any meds. She eats 1 meal a day and likes [place] food. Pt drinks 1 bottle of water daily. She may have 2 boiled eggs then eats again around 2 PM- if she cooks chicken or mashed potatoes (2 breasts will last her 3 days) (2 bags of instant potatoes); She may take chips and juice with her.  Kwanza's habits were reviewed today and are as follows: she thinks her family will eat healthier with her, her desired weight loss is 52 lbs, she has been heavy most of her life, she started gaining weight after age 71, her heaviest weight ever was 195 pounds, she is a picky eater and doesn't like to eat healthier foods, she has significant food cravings issues, she snacks frequently in the evenings, she skips meals frequently, she is frequently drinking liquids with calories, she frequently makes poor food choices, and she struggles with emotional eating.  Depression Screen Alka's Food and Mood (modified PHQ-9) score was 13.  Depression screen PHQ 2/9 06/21/2021  Decreased Interest 1  Down, Depressed, Hopeless 2  PHQ - 2 Score 3  Altered sleeping 0  Tired, decreased energy 3  Change in appetite 3  Feeling bad or failure about yourself  2  Trouble concentrating 2  Moving  slowly or fidgety/restless 0  Suicidal thoughts 0  PHQ-9 Score 13  Difficult doing work/chores Somewhat difficult   Subjective:   1. Other fatigue Chala admits to daytime somnolence and admits to waking up still tired. Patient has a history of symptoms of daytime fatigue, morning fatigue, and morning headache. Debbrah generally gets 5 or 6 hours of sleep per night, and states that she has generally restful sleep. Snoring is present. Apneic episodes are not present. Epworth Sleepiness Score is 10. EKG normal sinus rhythm at 84 bpm.  2. SOBOE (shortness of breath on exertion) Shanah notes increasing shortness of breath with exercising and seems to be worsening over time with weight gain. She notes getting out of breath sooner with activity than she used to. This has gotten worse recently. Annaliz denies shortness of breath at rest or orthopnea.  3. Vitamin D deficiency Dx likely given obesity.  4. Anemia, blood loss Pt has fibroids and was told she is anemic. She is awaiting schedule for a myomectomy.  5. At risk for deficient intake of food The patient is at a higher than average risk of deficient intake of food due to skipping meals.  Assessment/Plan:   1. Other fatigue Ellionna does feel that her weight is causing her energy to be lower than it should be. Fatigue may be related to obesity, depression or many other causes. Labs will be ordered, and in the meanwhile, Digna will focus on self care including making healthy food choices, increasing physical activity  and focusing on stress reduction. Check labs today.  - EKG 12-Lead - Comprehensive metabolic panel - Hemoglobin A1c - Insulin, random - T3 - T4, free - TSH  2. SOBOE (shortness of breath on exertion) Kenyatte does feel that she gets out of breath more easily that she used to when she exercises. Jaiana's shortness of breath appears to be obesity related and exercise induced. She has agreed to work on weight loss and  gradually increase exercise to treat her exercise induced shortness of breath. Will continue to monitor closely. Check labs today.  - Lipid Panel With LDL/HDL Ratio  3. Vitamin D deficiency Low Vitamin D level contributes to fatigue and are associated with obesity, breast, and colon cancer. She will follow-up for routine testing of Vitamin D, at least 2-3 times per year to avoid over-replacement. Check labs today.  - VITAMIN D 25 Hydroxy (Vit-D Deficiency, Fractures)  4. Anemia, blood loss Check labs today.  - Vitamin B12 - CBC with Differential/Platelet - Folate  5. Depression screening Lyliana had a positive depression screening. Depression is commonly associated with obesity and often results in emotional eating behaviors. We will monitor this closely and work on CBT to help improve the non-hunger eating patterns. Referral to Psychology may be required if no improvement is seen as she continues in our clinic.  6. At risk for deficient intake of food Rozina was given approximately 15 minutes of deficient intake of food prevention counseling today. Colisha is at risk for eating too few calories based on current food recall. She was encouraged to focus on meeting caloric and protein goals according to her recommended meal plan.  7. Obesity with current BMI of 36.3 Deundra is currently in the action stage of change and her goal is to continue with weight loss efforts. I recommend Luann begin the structured treatment plan as follows:  She has agreed to the Category 2 Plan + 100 calories with lunch options.  Exercise goals: All adults should avoid inactivity. Some physical activity is better than none, and adults who participate in any amount of physical activity gain some health benefits.   Behavioral modification strategies: increasing lean protein intake, no skipping meals, meal planning and cooking strategies, and better snacking choices.  She was informed of the importance of  frequent follow-up visits to maximize her success with intensive lifestyle modifications for her multiple health conditions. She was informed we would discuss her lab results at her next visit unless there is a critical issue that needs to be addressed sooner. Jaysa agreed to keep her next visit at the agreed upon time to discuss these results.  Objective:   Blood pressure 117/72, pulse 91, temperature 98.8 F (37.1 C), height 5\' 1"  (1.549 m), weight 192 lb (87.1 kg), last menstrual period 06/05/2021, SpO2 94 %. Body mass index is 36.28 kg/m.  EKG: Normal sinus rhythm, rate 84.  Indirect Calorimeter completed today shows a VO2 of 224 and a REE of 1541.  Her calculated basal metabolic rate is 8315 thus her basal metabolic rate is worse than expected.  General: Cooperative, alert, well developed, in no acute distress. HEENT: Conjunctivae and lids unremarkable. Cardiovascular: Regular rhythm.  Lungs: Normal work of breathing. Neurologic: No focal deficits.   Lab Results  Component Value Date   CREATININE 0.71 06/21/2021   BUN 11 06/21/2021   NA 134 06/21/2021   K 4.1 06/21/2021   CL 101 06/21/2021   CO2 20 06/21/2021   Lab Results  Component Value Date  ALT 5 06/21/2021   AST 15 06/21/2021   ALKPHOS 57 06/21/2021   BILITOT <0.2 06/21/2021   Lab Results  Component Value Date   HGBA1C 5.4 06/21/2021   Lab Results  Component Value Date   INSULIN 10.7 06/21/2021   Lab Results  Component Value Date   TSH 3.390 06/21/2021   Lab Results  Component Value Date   CHOL 265 (H) 06/21/2021   HDL 79 06/21/2021   LDLCALC 172 (H) 06/21/2021   TRIG 82 06/21/2021   Lab Results  Component Value Date   WBC 5.5 06/21/2021   HGB 8.6 (L) 06/21/2021   HCT 28.9 (L) 06/21/2021   MCV 77 (L) 06/21/2021   PLT 356 06/21/2021   Attestation Statements:   Reviewed by clinician on day of visit: allergies, medications, problem list, medical history, surgical history, family history,  social history, and previous encounter notes.  Coral Ceo, CMA, am acting as transcriptionist for Coralie Common, MD.  This is the patient's first visit at [facility]. The patient's NEW PATIENT PACKET was reviewed at length. Included in the packet: current and past health history, medications, allergies, ROS, gynecologic history (women only), surgical history, family history, social history, weight history, weight loss surgery history (for those that have had weight loss surgery), nutritional evaluation, mood and food questionnaire, PHQ9, Epworth questionnaire, sleep habits questionnaire, patient life and health improvement goals questionnaire. These will all be scanned into the patient's chart under media.   During the visit, I independently reviewed the patient's EKG, bioimpedance scale results, and indirect calorimeter results. I used this information to tailor a meal plan for the patient that will help her to lose weight and will improve her obesity-related conditions going forward. I performed a medically necessary appropriate examination and/or evaluation. I discussed the assessment and treatment plan with the patient. The patient was provided an opportunity to ask questions and all were answered. The patient agreed with the plan and demonstrated an understanding of the instructions. Labs were ordered at this visit and will be reviewed at the next visit unless more critical results need to be addressed immediately. Clinical information was updated and documented in the EMR.   Time spent on visit including pre-visit chart review and post-visit care was 40 minutes.   A separate 15 minutes was spent on risk counseling (see above).   I have reviewed the above documentation for accuracy and completeness, and I agree with the above. - Coralie Common, MD

## 2021-06-26 ENCOUNTER — Telehealth (INDEPENDENT_AMBULATORY_CARE_PROVIDER_SITE_OTHER): Payer: Self-pay | Admitting: Family Medicine

## 2021-06-27 ENCOUNTER — Telehealth: Payer: Self-pay | Admitting: Physician Assistant

## 2021-06-27 NOTE — Telephone Encounter (Signed)
Scheduled appt per 2/20 referral. Pt is aware of appt date and time. Pt is aware to arrive 15 mins prior to appt time and to bring and updated insurance card. Pt is aware of appt location.   °

## 2021-07-10 ENCOUNTER — Ambulatory Visit (INDEPENDENT_AMBULATORY_CARE_PROVIDER_SITE_OTHER): Payer: No Typology Code available for payment source | Admitting: Family Medicine

## 2021-07-10 ENCOUNTER — Other Ambulatory Visit: Payer: Self-pay

## 2021-07-10 ENCOUNTER — Encounter (INDEPENDENT_AMBULATORY_CARE_PROVIDER_SITE_OTHER): Payer: Self-pay | Admitting: Family Medicine

## 2021-07-10 VITALS — BP 111/70 | HR 94 | Temp 97.5°F | Ht 61.0 in | Wt 190.0 lb

## 2021-07-10 DIAGNOSIS — E559 Vitamin D deficiency, unspecified: Secondary | ICD-10-CM | POA: Diagnosis not present

## 2021-07-10 DIAGNOSIS — Z9189 Other specified personal risk factors, not elsewhere classified: Secondary | ICD-10-CM

## 2021-07-10 DIAGNOSIS — Z6836 Body mass index (BMI) 36.0-36.9, adult: Secondary | ICD-10-CM

## 2021-07-10 DIAGNOSIS — D509 Iron deficiency anemia, unspecified: Secondary | ICD-10-CM | POA: Diagnosis not present

## 2021-07-10 DIAGNOSIS — E8881 Metabolic syndrome: Secondary | ICD-10-CM

## 2021-07-10 DIAGNOSIS — E669 Obesity, unspecified: Secondary | ICD-10-CM

## 2021-07-10 DIAGNOSIS — E7849 Other hyperlipidemia: Secondary | ICD-10-CM | POA: Diagnosis not present

## 2021-07-10 MED ORDER — VITAMIN D (ERGOCALCIFEROL) 1.25 MG (50000 UNIT) PO CAPS: 50000.0000 [IU] | ORAL_CAPSULE | ORAL | 0 refills | Status: DC

## 2021-07-10 NOTE — Progress Notes (Signed)
[place] [phone]   [phone]  INITIAL CONSULT NOTE  Patient Care Team: Servando Salina, MD as PCP - General (Obstetrics and Gynecology)  Hematological/Oncological History 1) Labs from PCP, Dr. Coralie Common: -06/21/2021: WBC 5.5, Hgb 8.6 (L), MCV 77 (L), Plt 356K, Vitamin B12 674, Folate 11.1  2) 07/11/2021: Establish care with [facility] Hematology/Oncology  CHIEF COMPLAINTS/PURPOSE OF CONSULTATION:  "Microcytic anemia "  HISTORY OF PRESENTING ILLNESS:  Paige Fritz 31 y.o. female presents to the clinic for initial evaluation for microcytic anemia.  Patient is accompanied by her girlfriend for this visit.   On exam today, Paige Fritz reports persistent fatigue but she is able to complete all her daily activities on her own.  She has a good appetite but does not eat meat.  She has occasional episodes of nausea without vomiting.  She has chronic constipation with a bowel movement every 2 to 3 days.  She eats oatmeal regularly which has improved her frequency of bowel movements.  She denies easy bruising or signs of active bleeding except for her monthly menstrual cycle.  She reports having a period every 5 days with 1 to 2 days of heavy bleeding.  During the days of heavy bleeding, she wears 2 pads and has to change them every 1-2 hours.  She is under the care of an OB/GYN with plans to undergo ablation for uterine fibroids.  She has occasional episodes of dizziness without any syncopal episodes.  She has shortness of breath with exertion but none at rest.  Patient denies any fevers, chills, night sweats, chest pain or cough.  She has no other complaints.  Rest of the 10 point ROS is below.  MEDICAL HISTORY:  Past Medical History:  Diagnosis Date   Anemia    Back pain    Headache(784.0)    UTI (urinary tract infection)     SURGICAL HISTORY: Past Surgical History:  Procedure Laterality Date   NO PAST SURGERIES      SOCIAL  HISTORY: Social History   Socioeconomic History   Marital status: Single    Spouse name: Not on file   Number of children: Not on file   Years of education: Not on file   Highest education level: Not on file  Occupational History   Occupation: CNA, Occupational psychologist, [employer], Ship broker  Tobacco Use   Smoking status: Never   Smokeless tobacco: Never  Substance and Sexual Activity   Alcohol use: Yes    Comment: 1-2x/week   Drug use: No   Sexual activity: Yes    Birth control/protection: Pill  Other Topics Concern   Not on file  Social History Narrative   Not on file   Social Determinants of Health   Financial Resource Strain: Not on file  Food Insecurity: Not on file  Transportation Needs: Not on file  Physical Activity: Not on file  Stress: Not on file  Social Connections: Not on file  Intimate Partner Violence: Not on file    FAMILY HISTORY: Family History  Problem Relation Age of Onset   Obesity Mother    Hypertension Father     ALLERGIES:  has No Known Allergies.  MEDICATIONS:  Current Outpatient Medications  Medication Sig Dispense Refill   Vitamin D, Ergocalciferol, (DRISDOL) 1.25 MG (50000 UNIT) CAPS capsule Take 1 capsule (50,000 Units total) by mouth every 7 (seven) days. 4 capsule 0   No current facility-administered medications for this visit.    REVIEW OF SYSTEMS:  Constitutional: ( - ) fevers, ( - )  chills , ( - ) night sweats Eyes: ( - ) blurriness of vision, ( - ) double vision, ( - ) watery eyes Ears, nose, mouth, throat, and face: ( - ) mucositis, ( - ) sore throat Respiratory: ( - ) cough, ( +) dyspnea, ( - ) wheezes Cardiovascular: ( - ) palpitation, ( - ) chest discomfort, ( - ) lower extremity swelling Gastrointestinal:  ( + ) nausea, ( - ) heartburn, ( - ) change in bowel habits Skin: ( - ) abnormal skin rashes Lymphatics: ( - ) new lymphadenopathy, ( - ) easy bruising Neurological: ( - ) numbness, ( - ) tingling, ( - ) new  weaknesses Behavioral/Psych: ( - ) mood change, ( - ) new changes  All other systems were reviewed with the patient and are negative.  PHYSICAL EXAMINATION: ECOG PERFORMANCE STATUS: 1 - Symptomatic but completely ambulatory  Vitals:   07/11/21 1438  BP: 128/75  Pulse: 75  Resp: 20  Temp: 97.7 F (36.5 C)  SpO2: 100%   Filed Weights   07/11/21 1438  Weight: 197 lb 6.4 oz (89.5 kg)    GENERAL: well appearing female in NAD  SKIN: skin color, texture, turgor are normal, no rashes or significant lesions EYES: conjunctiva are pink and non-injected, sclera clear OROPHARYNX: no exudate, no erythema; lips, buccal mucosa, and tongue normal  LYMPH:  no palpable lymphadenopathy in the cervical or supraclavicular lymph nodes.  LUNGS: clear to auscultation and percussion with normal breathing effort HEART: regular rate & rhythm and no murmurs and trace bilateral lower extremity edema ABDOMEN: soft, non-tender, non-distended, normal bowel sounds Musculoskeletal: no cyanosis of digits and no clubbing  PSYCH: alert & oriented x 3, fluent speech NEURO: no focal motor/sensory deficits  LABORATORY DATA:  I have reviewed the data as listed CBC Latest Ref Rng & Units 06/21/2021 07/02/2015 06/21/2014  WBC 3.4 - 10.8 x10E3/uL 5.5 9.9 6.7  Hemoglobin 11.1 - 15.9 g/dL 8.6(L) 11.4(L) 12.5  Hematocrit 34.0 - 46.6 % 28.9(L) 33.4(L) 37.5  Platelets 150 - 450 x10E3/uL 356 347 353    CMP Latest Ref Rng & Units 06/21/2021 06/21/2014  Glucose 70 - 99 mg/dL 77 96  BUN 6 - 20 mg/dL 11 7  Creatinine 0.57 - 1.00 mg/dL 0.71 0.50  Sodium 134 - 144 mmol/L 134 139  Potassium 3.5 - 5.2 mmol/L 4.1 4.4  Chloride 96 - 106 mmol/L 101 110  CO2 20 - 29 mmol/L 20 24  Calcium 8.7 - 10.2 mg/dL 9.2 9.3  Total Protein 6.0 - 8.5 g/dL 7.2 7.5  Total Bilirubin 0.0 - 1.2 mg/dL <0.2 0.4  Alkaline Phos 44 - 121 IU/L 57 67  AST 0 - 40 IU/L 15 23  ALT 0 - 32 IU/L 5 26    ASSESSMENT & PLAN Paige Fritz is a 31 y.o.  female who presents to the clinic for evaluation for microcytic anemia. Most likely cause is iron deficiency secondary to heavy menstrual bleeding. Patient takes iron pills sparingly due to constipation.   Patient will proceed with serologic workup today and check CBC, CMP, retic panel, ferritin, iron and TIBC. If there is evidence of iron deficiency anemia with today's labs, we will arrange for IV iron infusion.   #Microcytic anemia: --Likely iron deficiency 2/2 heavy menstrual bleeding --Patient does not eat meat so gave list of iron rich foods to incorporate into diet --Unable to tolerate iron tablets due to constipation --Labs  today to check CBC, CMP, retic panel, ferritin, iron and TIBC.  --If today's labs confirm iron deficiency anemia, we will request IV iron infusions --Patient is under the care of OB/GYN and plans to undergo ablation for fibroids.  --RTC 8 weeks after IV iron infusion to repeat labs  Orders Placed This Encounter  Procedures   CBC with Differential ([place] Only)    Standing Status:   Future    Number of Occurrences:   1    Standing Expiration Date:   07/11/2022   CMP ([place] only)    Standing Status:   Future    Number of Occurrences:   1    Standing Expiration Date:   07/11/2022   Ferritin    Standing Status:   Future    Number of Occurrences:   1    Standing Expiration Date:   07/11/2022   Iron and Iron Binding Capacity ([facility] only)    Standing Status:   Future    Number of Occurrences:   1    Standing Expiration Date:   07/11/2022   Retic Panel    Standing Status:   Future    Number of Occurrences:   1    Standing Expiration Date:   07/11/2022    All questions were answered. The patient knows to call the clinic with any problems, questions or concerns.  I have spent a total of 60 minutes minutes of face-to-face and non-face-to-face time, preparing to see the patient, obtaining and/or reviewing separately obtained history, performing a  medically appropriate examination, counseling and educating the patient, ordering medications/tests/procedures, documenting clinical information in the electronic health record and care coordination.  Patient is already under the care of OB/GYN who is planning an ablation of her uterus.  We will plan to see the patient back approximately 4 to 6 weeks after last dose of IV iron in order to assure her levels are bolstered.  Dede Query, PA-C Department of Hematology/Oncology [place] at [facility] Phone: [phone]  Patient was seen with Dr. Lorenso Courier  I have read the above note and personally examined the patient. I agree with the assessment and plan as noted above.  Briefly Ms. Vari is a 31 year old female who presents for evaluation of microcytic anemia.  At this time findings are most consistent with iron deficiency anemia secondary to GYN bleeding.  Recommend we proceed with IV iron therapy in order to help bolster her iron levels if she is found to be iron deficient.   Ledell Peoples, MD Department of Hematology/Oncology [place] at [facility] Phone: [phone] Pager: [phone] Email: [email or web address]

## 2021-07-11 ENCOUNTER — Inpatient Hospital Stay: Payer: No Typology Code available for payment source

## 2021-07-11 ENCOUNTER — Inpatient Hospital Stay: Payer: No Typology Code available for payment source | Attending: Physician Assistant | Admitting: Physician Assistant

## 2021-07-11 ENCOUNTER — Encounter: Payer: Self-pay | Admitting: Physician Assistant

## 2021-07-11 VITALS — BP 128/75 | HR 75 | Temp 97.7°F | Resp 20 | Wt 197.4 lb

## 2021-07-11 DIAGNOSIS — Z8349 Family history of other endocrine, nutritional and metabolic diseases: Secondary | ICD-10-CM | POA: Insufficient documentation

## 2021-07-11 DIAGNOSIS — R42 Dizziness and giddiness: Secondary | ICD-10-CM | POA: Insufficient documentation

## 2021-07-11 DIAGNOSIS — D509 Iron deficiency anemia, unspecified: Secondary | ICD-10-CM

## 2021-07-11 DIAGNOSIS — R0602 Shortness of breath: Secondary | ICD-10-CM | POA: Insufficient documentation

## 2021-07-11 DIAGNOSIS — D259 Leiomyoma of uterus, unspecified: Secondary | ICD-10-CM | POA: Diagnosis not present

## 2021-07-11 DIAGNOSIS — R110 Nausea: Secondary | ICD-10-CM | POA: Insufficient documentation

## 2021-07-11 DIAGNOSIS — Z8249 Family history of ischemic heart disease and other diseases of the circulatory system: Secondary | ICD-10-CM | POA: Insufficient documentation

## 2021-07-11 DIAGNOSIS — N920 Excessive and frequent menstruation with regular cycle: Secondary | ICD-10-CM | POA: Insufficient documentation

## 2021-07-11 DIAGNOSIS — K5909 Other constipation: Secondary | ICD-10-CM | POA: Diagnosis not present

## 2021-07-11 DIAGNOSIS — R5383 Other fatigue: Secondary | ICD-10-CM | POA: Insufficient documentation

## 2021-07-11 DIAGNOSIS — D500 Iron deficiency anemia secondary to blood loss (chronic): Secondary | ICD-10-CM | POA: Insufficient documentation

## 2021-07-11 DIAGNOSIS — Z87440 Personal history of urinary (tract) infections: Secondary | ICD-10-CM | POA: Diagnosis not present

## 2021-07-11 LAB — CBC WITH DIFFERENTIAL (CANCER CENTER ONLY)
Abs Immature Granulocytes: 0.01 10*3/uL (ref 0.00–0.07)
Basophils Absolute: 0.1 10*3/uL (ref 0.0–0.1)
Basophils Relative: 1 %
Eosinophils Absolute: 0.1 10*3/uL (ref 0.0–0.5)
Eosinophils Relative: 2 %
HCT: 27 % — ABNORMAL LOW (ref 36.0–46.0)
Hemoglobin: 8 g/dL — ABNORMAL LOW (ref 12.0–15.0)
Immature Granulocytes: 0 %
Lymphocytes Relative: 41 %
Lymphs Abs: 2.1 10*3/uL (ref 0.7–4.0)
MCH: 22.9 pg — ABNORMAL LOW (ref 26.0–34.0)
MCHC: 29.6 g/dL — ABNORMAL LOW (ref 30.0–36.0)
MCV: 77.1 fL — ABNORMAL LOW (ref 80.0–100.0)
Monocytes Absolute: 0.4 10*3/uL (ref 0.1–1.0)
Monocytes Relative: 8 %
Neutro Abs: 2.5 10*3/uL (ref 1.7–7.7)
Neutrophils Relative %: 48 %
Platelet Count: 388 10*3/uL (ref 150–400)
RBC: 3.5 MIL/uL — ABNORMAL LOW (ref 3.87–5.11)
RDW: 17.5 % — ABNORMAL HIGH (ref 11.5–15.5)
WBC Count: 5.1 10*3/uL (ref 4.0–10.5)
nRBC: 0 % (ref 0.0–0.2)

## 2021-07-11 LAB — CMP (CANCER CENTER ONLY)
ALT: 6 U/L (ref 0–44)
AST: 12 U/L — ABNORMAL LOW (ref 15–41)
Albumin: 3.8 g/dL (ref 3.5–5.0)
Alkaline Phosphatase: 46 U/L (ref 38–126)
Anion gap: 4 — ABNORMAL LOW (ref 5–15)
BUN: 8 mg/dL (ref 6–20)
CO2: 27 mmol/L (ref 22–32)
Calcium: 8.9 mg/dL (ref 8.9–10.3)
Chloride: 104 mmol/L (ref 98–111)
Creatinine: 0.64 mg/dL (ref 0.44–1.00)
GFR, Estimated: >60 mL/min (ref >60)
Glucose, Bld: 89 mg/dL (ref 70–99)
Potassium: 3.9 mmol/L (ref 3.5–5.1)
Sodium: 135 mmol/L (ref 135–145)
Total Bilirubin: 0.3 mg/dL (ref 0.3–1.2)
Total Protein: 7.1 g/dL (ref 6.5–8.1)

## 2021-07-11 LAB — IRON AND IRON BINDING CAPACITY (CC-WL,HP ONLY)
Iron: 12 ug/dL — ABNORMAL LOW (ref 28–170)
Saturation Ratios: 3 % — ABNORMAL LOW (ref 10.4–31.8)
TIBC: 489 ug/dL — ABNORMAL HIGH (ref 250–450)
UIBC: 477 ug/dL — ABNORMAL HIGH (ref 148–442)

## 2021-07-11 LAB — RETIC PANEL
Immature Retic Fract: 20.3 % — ABNORMAL HIGH (ref 2.3–15.9)
RBC.: 3.46 MIL/uL — ABNORMAL LOW (ref 3.87–5.11)
Retic Count, Absolute: 40.1 10*3/uL (ref 19.0–186.0)
Retic Ct Pct: 1.2 % (ref 0.4–3.1)
Reticulocyte Hemoglobin: 23 pg — ABNORMAL LOW (ref >27.9)

## 2021-07-11 NOTE — Progress Notes (Signed)
Chief Complaint:   OBESITY Paige Fritz is here to discuss her progress with her obesity treatment plan along with follow-up of her obesity related diagnoses. Paige Fritz is on the Category 2 Plan plus 100 calories and states she is following her eating plan approximately 70% of the time. Paige Fritz states she is 12,000-13,000 steps 5 times per week.  Today's visit was #: 2 Starting weight: 192 lbs Starting date: 06/21/2021 Today's weight: 190 lbs Today's date: 07/10/2021 Total lbs lost to date: 2 lbs Total lbs lost since last in-office visit: 2 lbs  Interim History: Paige Fritz returns to the clinic for 1st follow up. She is planning on doing no meat in March. She has plans to go to Hematology tomorrow for anemia assessment. She voices that she tried to be mindful in breaking down meals and spreading out food. She has been trying to choose better snacking options.   Subjective:   1. Vitamin D deficiency Julita's Vitamin D level of 5.9. She is not on supplementation. This is a new diagnosis.   2. Other hyperlipidemia Sawsan's LDL was 172. Her HDL was 79. Triglycerides was 82. She has no ascvd risk score due to age. She is not on medications currently. She is experiencing signs of fatigue.   3. Microcytic anemia Lizvette's hemoglobin was 8.6. Her hematocrit was 28.9. Her mean corpuscular volume was 77. She is awaiting on a myomectomy which is not scheduled yet.   4. Insulin resistance Macala's last A1C was 5.4. Her insulin was 10.7. This is a new diagnosis.   5. At risk of diabetes mellitus Ithzel is at higher than average risk for developing diabetes due to her obesity.   Assessment/Plan:   1. Vitamin D deficiency Low Vitamin D level contributes to fatigue and are associated with obesity, breast, and colon cancer. Akasia agrees to start  prescription Vitamin D 50,000 IU every week for 1 month with no refills and Paige Fritz will follow-up for routine testing of Vitamin D, at least 2-3 times  per year to avoid over-replacement. We discussed labs today.   - Vitamin D, Ergocalciferol, (DRISDOL) 1.25 MG (50000 UNIT) CAPS capsule; Take 1 capsule (50,000 Units total) by mouth every 7 (seven) days.  Dispense: 4 capsule; Refill: 0  2. Other hyperlipidemia Cardiovascular risk and specific lipid/LDL goals reviewed.  We will repeat labs in 3-4 months.We discussed labs today. We discussed several lifestyle modifications today and Paige Fritz will continue to work on diet, exercise and weight loss efforts. Orders and follow up as documented in patient record.   Counseling Intensive lifestyle modifications are the first line treatment for this issue. Dietary changes: Increase soluble fiber. Decrease simple carbohydrates. Exercise changes: Moderate to vigorous-intensity aerobic activity 150 minutes per week if tolerated. Lipid-lowering medications: see documented in medical record.  3. Microcytic anemia We will follow up on Teela's hematology evaluation at next appointment. We discussed labs today.   4. Insulin resistance Pasha will change to Vegetarian Plan and will repeat labs in 3-4 months. She will continue to work on weight loss, exercise, and decreasing simple carbohydrates to help decrease the risk of diabetes. Emmalyne agreed to follow-up with [place] as directed to closely monitor her progress.We will discussed labs today.  5. At risk of diabetes mellitus Alexiss was given approximately 15 minutes of diabetic education and counseling today. We discussed intensive lifestyle modifications today with an emphasis on weight loss as well as increasing exercise and decreasing simple carbohydrates in her diet. We also reviewed medication options with  an emphasis on risk versus benefits of those discussed.  Repetitive spaced learning was employed today to elicit superior memory formation and behavioral change.   6. Obesity with current BMI of 36.0 Delayna is currently in the action stage of change.  As such, her goal is to continue with weight loss efforts. She has agreed to the Vegetarian Plan plus 100 calories.   Exercise goals: All adults should avoid inactivity. Some physical activity is better than none, and adults who participate in any amount of physical activity gain some health benefits.  Behavioral modification strategies: increasing lean protein intake, meal planning and cooking strategies, keeping healthy foods in the home, and better snacking choices.  Paige Fritz has agreed to follow-up with our clinic in 2-3 weeks. She was informed of the importance of frequent follow-up visits to maximize her success with intensive lifestyle modifications for her multiple health conditions.   Objective:   Blood pressure 111/70, pulse 94, temperature (!) 97.5 F (36.4 C), height 5\' 1"  (1.549 m), weight 190 lb (86.2 kg), last menstrual period 06/29/2021, SpO2 99 %. Body mass index is 35.9 kg/m.  General: Cooperative, alert, well developed, in no acute distress. HEENT: Conjunctivae and lids unremarkable. Cardiovascular: Regular rhythm.  Lungs: Normal work of breathing. Neurologic: No focal deficits.   Lab Results  Component Value Date   CREATININE 0.71 06/21/2021   BUN 11 06/21/2021   NA 134 06/21/2021   K 4.1 06/21/2021   CL 101 06/21/2021   CO2 20 06/21/2021   Lab Results  Component Value Date   ALT 5 06/21/2021   AST 15 06/21/2021   ALKPHOS 57 06/21/2021   BILITOT <0.2 06/21/2021   Lab Results  Component Value Date   HGBA1C 5.4 06/21/2021   Lab Results  Component Value Date   INSULIN 10.7 06/21/2021   Lab Results  Component Value Date   TSH 3.390 06/21/2021   Lab Results  Component Value Date   CHOL 265 (H) 06/21/2021   HDL 79 06/21/2021   LDLCALC 172 (H) 06/21/2021   TRIG 82 06/21/2021   Lab Results  Component Value Date   VD25OH 5.9 (L) 06/21/2021   Lab Results  Component Value Date   WBC 5.5 06/21/2021   HGB 8.6 (L) 06/21/2021   HCT 28.9 (L)  06/21/2021   MCV 77 (L) 06/21/2021   PLT 356 06/21/2021   No results found for: IRON, TIBC, FERRITIN  Attestation Statements:   Reviewed by clinician on day of visit: allergies, medications, problem list, medical history, surgical history, family history, social history, and previous encounter notes.  I, Jackson Latino, RMA, am acting as transcriptionist for Reuben Likes, MD.  I have reviewed the above documentation for accuracy and completeness, and I agree with the above. - Reuben Likes, MD

## 2021-07-12 ENCOUNTER — Telehealth: Payer: Self-pay | Admitting: Physician Assistant

## 2021-07-12 ENCOUNTER — Telehealth: Payer: Self-pay

## 2021-07-12 ENCOUNTER — Telehealth: Payer: Self-pay | Admitting: Pharmacy Technician

## 2021-07-12 LAB — FERRITIN: Ferritin: <4 ng/mL — ABNORMAL LOW (ref 11–307)

## 2021-07-12 NOTE — Telephone Encounter (Signed)
Scheduled per 3/7 los, message has been left ?

## 2021-07-12 NOTE — Telephone Encounter (Signed)
Auth Submission: pending ?Payer: [place] [employer] ?Medication & CPT/J Code(s) submitted: monoferric [id number] ?Route of submission (phone, fax, portal):  ?PHONE: [phone] ?FAX: [phone] ?Auth type: Buy/Bill ?Units/visits requested: 1 visit ?Reference number: [id number] ? ?Patient will need to call insurance to activate plan, once activated benefit details will apply: ?OOP: $2500 ?No deduct. ?Co-insurance: 20%. ? ?If patient does not call to activate plan: ?Deduct: $600 ?Oop: $7900 ?Co-insurance: 40% ? ?Left v/m message with patient in regards to plan information, awaiting call back ? ?Will update once we receive a response. ? ?  ?

## 2021-07-12 NOTE — Telephone Encounter (Signed)
-----   Message from Lincoln Brigham, PA-C sent at 07/11/2021  4:35 PM EST ----- ?Please notify patient that labs are consistent with iron deficiency anemia. We will arrange for IV iron infusions at [facility] ? ?

## 2021-07-12 NOTE — Telephone Encounter (Signed)
Pt advised of lab results an the need for an IV Infusion at [facility] ?

## 2021-07-13 ENCOUNTER — Encounter: Payer: Self-pay | Admitting: Physician Assistant

## 2021-07-17 NOTE — Telephone Encounter (Signed)
Dr. Charlies Silvers, ? ?Fyi note: ?Auth Submission: APPROVED ?Payer: [employer] ?Medication & CPT/J Code(s) submitted: MONOFERRIC ?Route of submission (phone, fax, portal): PHONE ?Auth type: Buy/Bill ?Units/visits requested: 1 ?Reference number: [id number] ?Approval from: 07/14/21 to 08/13/21  ? ?Patient has been enrolled in the monoferric co-pay card. Awaiting response. ? ?Patient will be scheduled as soon as possible

## 2021-07-21 ENCOUNTER — Other Ambulatory Visit: Payer: Self-pay

## 2021-07-21 ENCOUNTER — Ambulatory Visit (INDEPENDENT_AMBULATORY_CARE_PROVIDER_SITE_OTHER): Payer: No Typology Code available for payment source

## 2021-07-21 VITALS — BP 117/75 | HR 94 | Temp 98.3°F | Resp 18 | Ht 61.0 in | Wt 193.0 lb

## 2021-07-21 DIAGNOSIS — D500 Iron deficiency anemia secondary to blood loss (chronic): Secondary | ICD-10-CM | POA: Diagnosis not present

## 2021-07-21 MED ORDER — SODIUM CHLORIDE 0.9 % IV SOLN
1000.0000 mg | Freq: Once | INTRAVENOUS | Status: AC
  Administered 2021-07-21: 1000 mg via INTRAVENOUS
  Filled 2021-07-21 (×2): qty 10

## 2021-07-21 NOTE — Progress Notes (Signed)
Diagnosis: Iron Deficiency Anemia ? ?Provider:  Marshell Garfinkel, MD ? ?Procedure: Infusion ? ?IV Type: Peripheral, IV Location: L Antecubital ? ?Monoferric (Ferric Derisomaltose), Dose: 1000 mg ? ?Infusion Start Time: 1527 ? ?Infusion Stop Time: 0352 ? ?Post Infusion IV Care: Observation period completed and Peripheral IV Discontinued ? ?Discharge: Condition: Good, Destination: Home . AVS provided to patient.  ? ?Performed by:  Cleophus Molt, RN  ?  ?

## 2021-07-25 NOTE — Telephone Encounter (Addendum)
Monoferric co-pay card: ?Patient has been approved ?Id: [id number] ?Card: [phone] ?Cvv: 039 ?Exp: 08/05/26 ? ?PHONE: [phone] ?FAX CLAIMS: [phone] ? ?

## 2021-08-01 ENCOUNTER — Other Ambulatory Visit: Payer: Self-pay

## 2021-08-01 ENCOUNTER — Encounter (INDEPENDENT_AMBULATORY_CARE_PROVIDER_SITE_OTHER): Payer: Self-pay | Admitting: Family Medicine

## 2021-08-01 ENCOUNTER — Ambulatory Visit (INDEPENDENT_AMBULATORY_CARE_PROVIDER_SITE_OTHER): Payer: No Typology Code available for payment source | Admitting: Family Medicine

## 2021-08-01 VITALS — BP 110/72 | HR 85 | Temp 98.2°F | Ht 61.0 in | Wt 186.0 lb

## 2021-08-01 DIAGNOSIS — D508 Other iron deficiency anemias: Secondary | ICD-10-CM

## 2021-08-01 DIAGNOSIS — Z9189 Other specified personal risk factors, not elsewhere classified: Secondary | ICD-10-CM

## 2021-08-01 DIAGNOSIS — Z6835 Body mass index (BMI) 35.0-35.9, adult: Secondary | ICD-10-CM

## 2021-08-01 DIAGNOSIS — E559 Vitamin D deficiency, unspecified: Secondary | ICD-10-CM | POA: Diagnosis not present

## 2021-08-01 DIAGNOSIS — E669 Obesity, unspecified: Secondary | ICD-10-CM

## 2021-08-01 MED ORDER — VITAMIN D (ERGOCALCIFEROL) 1.25 MG (50000 UNIT) PO CAPS: 50000.0000 [IU] | ORAL_CAPSULE | ORAL | 0 refills | Status: DC

## 2021-08-02 NOTE — Progress Notes (Signed)
? ? ? ?Chief Complaint:  ? ?OBESITY ?Paige Fritz is here to discuss her progress with her obesity treatment plan along with follow-up of her obesity related diagnoses. [name] is on the Vegetarian Plan + 100 calories and states she is following her eating plan approximately 70 to 80% of the time. [name] states she has been walking at work. ? ?Today's visit was #: 3 ?Starting weight: 192 lbs ?Starting date: 06/21/2021 ?Today's weight: 186 lbs ?Today's date: 08/01/2021 ?Total lbs lost to date: 6 ?Total lbs lost since last in-office visit: 4 ? ?Interim History: [name] has been dealing with a tooth abscess over the last few days. She hasn't been able to eat much in the last 4 days. [name] is eating meat 1 time per day. The rest of the day, beside the meal that she is eating with meat, is mostly fruit and vegetables. ? ?Subjective:  ? ?1. Vitamin D deficiency ?She is currently taking prescription vitamin D 50,000 IU each week. Her last vitamin D level was 5.9. She admits fatigue and denies nausea, vomiting or muscle weakness. ? ?Lab Results  ?Component Value Date  ? VD25OH 5.9 (L) 06/21/2021  ? ?2. Other iron deficiency anemia ?[name] saw hematology and received an iron infusion. Labs are to be done on 09/06/21. ? ?3. At risk for deficient intake of food ?[name] is at risk for deficient food intake due to her tooth abscess. ? ?Assessment/Plan:  ? ?1. Vitamin D deficiency ?Low Vitamin D level contributes to fatigue and are associated with obesity, breast, and colon cancer. She agrees to continue to take prescription Vitamin D '@50'$ ,000 IU every week and will follow-up for routine testing of Vitamin D, at least 2-3 times per year to avoid over-replacement. ? ?- Vitamin D, Ergocalciferol, (DRISDOL) 1.25 MG (50000 UNIT) CAPS capsule; Take 1 capsule (50,000 Units total) by mouth every 7 (seven) days.  Dispense: 4 capsule; Refill: 0 ? ?2. Other iron deficiency anemia ?Adlai already has an appointment and labs that are set up  for 09/06/21. ? ?3. At risk for deficient intake of food ?[name] was given approximately 15 minutes of deficient intake of food prevention counseling today. [name] is at risk for eating too few calories based on current food recall. She was encouraged to focus on meeting caloric and protein goals according to her recommended meal plan.  ? ?4. Obesity with current BMI of 35.3 ?Aide is currently in the action stage of change. As such, her goal is to continue with weight loss efforts. She has agreed to the [place].  ? ?Exercise goals: All adults should avoid inactivity. Some physical activity is better than none, and adults who participate in any amount of physical activity gain some health benefits. ? ?Behavioral modification strategies: increasing lean protein intake, meal planning and cooking strategies, and keeping healthy foods in the home. ? ?[name] has agreed to follow-up with our clinic in 3 weeks. She was informed of the importance of frequent follow-up visits to maximize her success with intensive lifestyle modifications for her multiple health conditions.  ? ?Objective:  ? ?Blood pressure 110/72, pulse 85, temperature 98.2 ?F (36.8 ?C), height '5\' 1"'$  (1.549 m), weight 186 lb (84.4 kg), last menstrual period 07/26/2021, SpO2 99 %. ?Body mass index is 35.14 kg/m?. ? ?General: Cooperative, alert, well developed, in no acute distress. ?HEENT: Conjunctivae and lids unremarkable. ?Cardiovascular: Regular rhythm.  ?Lungs: Normal work of breathing. ?Neurologic: No focal deficits.  ? ?Lab Results  ?Component Value Date  ?  CREATININE 0.64 07/11/2021  ? BUN 8 07/11/2021  ? NA 135 07/11/2021  ? K 3.9 07/11/2021  ? CL 104 07/11/2021  ? CO2 27 07/11/2021  ? ?Lab Results  ?Component Value Date  ? ALT 6 07/11/2021  ? AST 12 (L) 07/11/2021  ? ALKPHOS 46 07/11/2021  ? BILITOT 0.3 07/11/2021  ? ?Lab Results  ?Component Value Date  ? HGBA1C 5.4 06/21/2021  ? ?Lab Results  ?Component Value  Date  ? INSULIN 10.7 06/21/2021  ? ?Lab Results  ?Component Value Date  ? TSH 3.390 06/21/2021  ? ?Lab Results  ?Component Value Date  ? CHOL 265 (H) 06/21/2021  ? HDL 79 06/21/2021  ? LDLCALC 172 (H) 06/21/2021  ? TRIG 82 06/21/2021  ? ?Lab Results  ?Component Value Date  ? VD25OH 5.9 (L) 06/21/2021  ? ?Lab Results  ?Component Value Date  ? WBC 5.1 07/11/2021  ? HGB 8.0 (L) 07/11/2021  ? HCT 27.0 (L) 07/11/2021  ? MCV 77.1 (L) 07/11/2021  ? PLT 388 07/11/2021  ? ?Lab Results  ?Component Value Date  ? IRON 12 (L) 07/11/2021  ? TIBC 489 (H) 07/11/2021  ? FERRITIN <4 (L) 07/11/2021  ? ?Attestation Statements:  ? ?Reviewed by clinician on day of visit: allergies, medications, problem list, medical history, surgical history, family history, social history, and previous encounter notes. ? ? ?I, Marcille Blanco, CMA, am acting as transcriptionist for Coralie Common, MD ? ?I have reviewed the above documentation for accuracy and completeness, and I agree with the above. Coralie Common, MD ? ?

## 2021-08-23 ENCOUNTER — Ambulatory Visit (INDEPENDENT_AMBULATORY_CARE_PROVIDER_SITE_OTHER): Payer: No Typology Code available for payment source | Admitting: Family Medicine

## 2021-09-05 ENCOUNTER — Other Ambulatory Visit: Payer: Self-pay | Admitting: Physician Assistant

## 2021-09-05 DIAGNOSIS — D500 Iron deficiency anemia secondary to blood loss (chronic): Secondary | ICD-10-CM

## 2021-09-06 ENCOUNTER — Inpatient Hospital Stay: Payer: No Typology Code available for payment source | Attending: Physician Assistant

## 2021-09-06 ENCOUNTER — Other Ambulatory Visit: Payer: Self-pay

## 2021-09-06 ENCOUNTER — Inpatient Hospital Stay (HOSPITAL_BASED_OUTPATIENT_CLINIC_OR_DEPARTMENT_OTHER): Payer: No Typology Code available for payment source | Admitting: Physician Assistant

## 2021-09-06 VITALS — BP 135/70 | HR 86 | Temp 97.3°F | Resp 16 | Wt 193.6 lb

## 2021-09-06 DIAGNOSIS — Z87440 Personal history of urinary (tract) infections: Secondary | ICD-10-CM | POA: Insufficient documentation

## 2021-09-06 DIAGNOSIS — D500 Iron deficiency anemia secondary to blood loss (chronic): Secondary | ICD-10-CM

## 2021-09-06 DIAGNOSIS — Z8249 Family history of ischemic heart disease and other diseases of the circulatory system: Secondary | ICD-10-CM | POA: Diagnosis not present

## 2021-09-06 DIAGNOSIS — R109 Unspecified abdominal pain: Secondary | ICD-10-CM | POA: Diagnosis not present

## 2021-09-06 DIAGNOSIS — K5900 Constipation, unspecified: Secondary | ICD-10-CM | POA: Diagnosis not present

## 2021-09-06 DIAGNOSIS — D509 Iron deficiency anemia, unspecified: Secondary | ICD-10-CM | POA: Insufficient documentation

## 2021-09-06 DIAGNOSIS — Z8349 Family history of other endocrine, nutritional and metabolic diseases: Secondary | ICD-10-CM | POA: Insufficient documentation

## 2021-09-06 DIAGNOSIS — Z79899 Other long term (current) drug therapy: Secondary | ICD-10-CM | POA: Diagnosis not present

## 2021-09-06 LAB — CBC WITH DIFFERENTIAL (CANCER CENTER ONLY)
Abs Immature Granulocytes: 0.01 10*3/uL (ref 0.00–0.07)
Basophils Absolute: 0.1 10*3/uL (ref 0.0–0.1)
Basophils Relative: 1 %
Eosinophils Absolute: 0.1 10*3/uL (ref 0.0–0.5)
Eosinophils Relative: 2 %
HCT: 35.2 % — ABNORMAL LOW (ref 36.0–46.0)
Hemoglobin: 11.4 g/dL — ABNORMAL LOW (ref 12.0–15.0)
Immature Granulocytes: 0 %
Lymphocytes Relative: 39 %
Lymphs Abs: 2.5 10*3/uL (ref 0.7–4.0)
MCH: 27.7 pg (ref 26.0–34.0)
MCHC: 32.4 g/dL (ref 30.0–36.0)
MCV: 85.6 fL (ref 80.0–100.0)
Monocytes Absolute: 0.3 10*3/uL (ref 0.1–1.0)
Monocytes Relative: 5 %
Neutro Abs: 3.3 10*3/uL (ref 1.7–7.7)
Neutrophils Relative %: 53 %
Platelet Count: 327 10*3/uL (ref 150–400)
RBC: 4.11 MIL/uL (ref 3.87–5.11)
RDW: 22.5 % — ABNORMAL HIGH (ref 11.5–15.5)
WBC Count: 6.3 10*3/uL (ref 4.0–10.5)
nRBC: 0 % (ref 0.0–0.2)

## 2021-09-06 LAB — IRON AND IRON BINDING CAPACITY (CC-WL,HP ONLY)
Iron: 73 ug/dL (ref 28–170)
Saturation Ratios: 23 % (ref 10.4–31.8)
TIBC: 321 ug/dL (ref 250–450)
UIBC: 248 ug/dL (ref 148–442)

## 2021-09-06 LAB — RETIC PANEL
Immature Retic Fract: 7.7 % (ref 2.3–15.9)
RBC.: 4.1 MIL/uL (ref 3.87–5.11)
Retic Count, Absolute: 42.2 10*3/uL (ref 19.0–186.0)
Retic Ct Pct: 1 % (ref 0.4–3.1)
Reticulocyte Hemoglobin: 33.4 pg (ref >27.9)

## 2021-09-06 LAB — VITAMIN D 25 HYDROXY (VIT D DEFICIENCY, FRACTURES): Vit D, 25-Hydroxy: 34.01 ng/mL (ref 30–100)

## 2021-09-06 NOTE — Progress Notes (Signed)
?[facility] ?[phone]   [phone] ? ?PROGRESS NOTE ? ?Patient Care Team: ?Servando Salina, MD as PCP - General (Obstetrics and Gynecology) ? ?Hematological/Oncological History ?1) Labs from PCP, Dr. Coralie Common: ?-06/21/2021: WBC 5.5, Hgb 8.6 (L), MCV 77 (L), Plt 356K, Vitamin B12 674, Folate 11.1 ? ?2) 07/11/2021: Establish care with [facility] ? ?3) 07/21/2021: Received IV monoferric 1000 mg x 1 dose ? ?CHIEF COMPLAINTS/PURPOSE OF CONSULTATION:  ?Iron deficiency anemia ? ?HISTORY OF PRESENTING ILLNESS:  ?Paige Fritz 31 y.o. female returns for a follow up for iron deficiency anemia. She was last seen on 07/11/2021 to establish care. Since then, she has received IV monoferric. She is unaccompanied for this visit today.  ? ?On exam today, Paige Fritz reports her energy levels have improved significantly since receiving IV iron. She experienced some abdominal cramps and constipation after the infusion but that resolved shortly after. She reports her appetite is stable and she is starting to eat more meat. She continues to have heavy menstrual cycles and is not taking any medication to help minimize bleeding. She denies any fevers, chills, night sweats, shortness of breath, chest pain or cough.  She has no other complaints.  Rest of the 10 point ROS is below. ? ?MEDICAL HISTORY:  ?Past Medical History:  ?Diagnosis Date  ? Anemia   ? Back pain   ? Headache(784.0)   ? UTI (urinary tract infection)   ? ? ?SURGICAL HISTORY: ?Past Surgical History:  ?Procedure Laterality Date  ? NO PAST SURGERIES    ? ? ?SOCIAL HISTORY: ?Social History  ? ?Socioeconomic History  ? Marital status: Single  ?  Spouse name: Not on file  ? Number of children: Not on file  ? Years of education: Not on file  ? Highest education level: Not on file  ?Occupational History  ? Occupation: CNA, Occupational psychologist, [employer], Ship broker  ?Tobacco Use  ? Smoking status: Never  ? Smokeless  tobacco: Never  ?Substance and Sexual Activity  ? Alcohol use: Yes  ?  Comment: 1-2x/week  ? Drug use: No  ? Sexual activity: Yes  ?  Birth control/protection: Pill  ?Other Topics Concern  ? Not on file  ?Social History Narrative  ? Not on file  ? ?Social Determinants of Health  ? ?Financial Resource Strain: Not on file  ?Food Insecurity: Not on file  ?Transportation Needs: Not on file  ?Physical Activity: Not on file  ?Stress: Not on file  ?Social Connections: Not on file  ?Intimate Partner Violence: Not on file  ? ? ?FAMILY HISTORY: ?Family History  ?Problem Relation Age of Onset  ? Obesity Mother   ? Hypertension Father   ? ? ?ALLERGIES:  has No Known Allergies. ? ?MEDICATIONS:  ?Current Outpatient Medications  ?Medication Sig Dispense Refill  ? amoxicillin (AMOXIL) 500 MG capsule Take 500 mg by mouth 3 (three) times daily. (Patient not taking: Reported on 09/06/2021)    ? Vitamin D, Ergocalciferol, (DRISDOL) 1.25 MG (50000 UNIT) CAPS capsule Take 1 capsule (50,000 Units total) by mouth every 7 (seven) days. 4 capsule 0  ? ?No current facility-administered medications for this visit.  ? ? ?REVIEW OF SYSTEMS:   ?Constitutional: ( - ) fevers, ( - )  chills , ( - ) night sweats ?Eyes: ( - ) blurriness of vision, ( - ) double vision, ( - ) watery eyes ?Ears, nose, mouth, throat, and face: ( - ) mucositis, ( - ) sore throat ?Respiratory: ( - )  cough, ( -) dyspnea, ( - ) wheezes ?Cardiovascular: ( - ) palpitation, ( - ) chest discomfort, ( - ) lower extremity swelling ?Gastrointestinal:  ( - ) nausea, ( - ) heartburn, ( - ) change in bowel habits ?Skin: ( - ) abnormal skin rashes ?Lymphatics: ( - ) new lymphadenopathy, ( - ) easy bruising ?Neurological: ( - ) numbness, ( - ) tingling, ( - ) new weaknesses ?Behavioral/Psych: ( - ) mood change, ( - ) new changes  ?All other systems were reviewed with the patient and are negative. ? ?PHYSICAL EXAMINATION: ?ECOG PERFORMANCE STATUS: 1 - Symptomatic but completely  ambulatory ? ?Vitals:  ? 09/06/21 1449  ?BP: 135/70  ?Pulse: 86  ?Resp: 16  ?Temp: (!) 97.3 ?F (36.3 ?C)  ?SpO2: 100%  ? ? ?Filed Weights  ? 09/06/21 1449  ?Weight: 193 lb 9.6 oz (87.8 kg)  ? ? ? ?GENERAL: well appearing female in NAD  ?SKIN: skin color, texture, turgor are normal, no rashes or significant lesions ?EYES: conjunctiva are pink and non-injected, sclera clear ?LUNGS: clear to auscultation and percussion with normal breathing effort ?HEART: regular rate & rhythm and no murmurs and trace bilateral lower extremity edema ?Musculoskeletal: no cyanosis of digits and no clubbing  ?PSYCH: alert & oriented x 3, fluent speech ?NEURO: no focal motor/sensory deficits ? ?LABORATORY DATA:  ?I have reviewed the data as listed ? ?  Latest Ref Rng & Units 09/06/2021  ?  2:31 PM 07/11/2021  ?  3:23 PM 06/21/2021  ?  3:31 PM  ?CBC  ?WBC 4.0 - 10.5 K/uL 6.3   5.1   5.5    ?Hemoglobin 12.0 - 15.0 g/dL 11.4   8.0   8.6    ?Hematocrit 36.0 - 46.0 % 35.2   27.0   28.9    ?Platelets 150 - 400 K/uL 327   388   356    ? ? ? ?  Latest Ref Rng & Units 07/11/2021  ?  3:23 PM 06/21/2021  ?  3:31 PM 06/21/2014  ?  7:15 PM  ?CMP  ?Glucose 70 - 99 mg/dL 89   77   96    ?BUN 6 - 20 mg/dL '8   11   7    '$ ?Creatinine 0.44 - 1.00 mg/dL 0.64   0.71   0.50    ?Sodium 135 - 145 mmol/L 135   134   139    ?Potassium 3.5 - 5.1 mmol/L 3.9   4.1   4.4    ?Chloride 98 - 111 mmol/L 104   101   110    ?CO2 22 - 32 mmol/L '27   20   24    '$ ?Calcium 8.9 - 10.3 mg/dL 8.9   9.2   9.3    ?Total Protein 6.5 - 8.1 g/dL 7.1   7.2   7.5    ?Total Bilirubin 0.3 - 1.2 mg/dL 0.3   <0.2   0.4    ?Alkaline Phos 38 - 126 U/L 46   57   67    ?AST 15 - 41 U/L '12   15   23    '$ ?ALT 0 - 44 U/L '6   5   26    '$ ? ? ?ASSESSMENT & PLAN ?Paige Fritz is a 31 y.o. female returns for a follow up for iron deficiency anemia. ? ?#Iron deficiency anemia 2/2 heavy menstrual bleeding ?--Unable to tolerate iron tablets due to constipation ?--Received IV monoferric 1000 mg  x 1 dose on  07/21/2021.  ?--Patient is under the care of OB/GYN, Dr. Maudry Diego. Advised patient to follow up to discuss interventions to help decrease bleeding. Patient is not interested in taking birth control.  ?--Labs today show improvement of anemia with Hgb of 11.4, MCV 85.6. Iron panel shows improvement with ferritin 25, iron 73, TIBC 321 and saturation 23% ?--No need for additional IV iron at this time. ?--RTC in 3 months to repeat labs.  ? ?Orders Placed This Encounter  ?Procedures  ? Vitamin D 25 hydroxy  ?  Standing Status:   Future  ?  Number of Occurrences:   1  ?  Standing Expiration Date:   09/06/2022  ? ? ?All questions were answered. The patient knows to call the clinic with any problems, questions or concerns. ? ?I have spent a total 25 minutes minutes of face-to-face and non-face-to-face time, preparing to see the patient, performing a medically appropriate examination, counseling and educating the patient, ordering tests, documenting clinical information in the electronic health record and care coordination.   ? ?Dede Query, PA-C ?Department of Hematology/Oncology ?[facility] ?Phone: [phone] ? ? ?

## 2021-09-07 ENCOUNTER — Telehealth: Payer: Self-pay

## 2021-09-07 LAB — FERRITIN: Ferritin: 25 ng/mL (ref 11–307)

## 2021-09-07 NOTE — Telephone Encounter (Signed)
-----   Message from Lincoln Brigham, PA-C sent at 09/07/2021  1:20 PM EDT ----- ?Please notify patient that iron levels show no evidence of deficiency. No need for additional IV iron at this time. Also, vitamin D was normal. We will see her back in 3 months to check her labs ?

## 2021-09-07 NOTE — Telephone Encounter (Signed)
Pt advised of lab results and verbalized understanding.  

## 2021-12-04 ENCOUNTER — Other Ambulatory Visit: Payer: Self-pay | Admitting: Physician Assistant

## 2021-12-04 DIAGNOSIS — D500 Iron deficiency anemia secondary to blood loss (chronic): Secondary | ICD-10-CM

## 2021-12-05 ENCOUNTER — Inpatient Hospital Stay: Payer: No Typology Code available for payment source | Attending: Physician Assistant

## 2021-12-05 ENCOUNTER — Other Ambulatory Visit: Payer: Self-pay

## 2021-12-05 ENCOUNTER — Inpatient Hospital Stay (HOSPITAL_BASED_OUTPATIENT_CLINIC_OR_DEPARTMENT_OTHER): Payer: No Typology Code available for payment source | Admitting: Physician Assistant

## 2021-12-05 VITALS — BP 132/72 | HR 84 | Temp 97.5°F | Resp 20 | Wt 201.5 lb

## 2021-12-05 DIAGNOSIS — D500 Iron deficiency anemia secondary to blood loss (chronic): Secondary | ICD-10-CM | POA: Diagnosis present

## 2021-12-05 DIAGNOSIS — Z8249 Family history of ischemic heart disease and other diseases of the circulatory system: Secondary | ICD-10-CM | POA: Diagnosis not present

## 2021-12-05 DIAGNOSIS — Z87440 Personal history of urinary (tract) infections: Secondary | ICD-10-CM | POA: Diagnosis not present

## 2021-12-05 DIAGNOSIS — Z79899 Other long term (current) drug therapy: Secondary | ICD-10-CM | POA: Diagnosis not present

## 2021-12-05 DIAGNOSIS — Z8349 Family history of other endocrine, nutritional and metabolic diseases: Secondary | ICD-10-CM | POA: Insufficient documentation

## 2021-12-05 DIAGNOSIS — N920 Excessive and frequent menstruation with regular cycle: Secondary | ICD-10-CM | POA: Insufficient documentation

## 2021-12-05 LAB — CBC WITH DIFFERENTIAL (CANCER CENTER ONLY)
Abs Immature Granulocytes: 0.01 10*3/uL (ref 0.00–0.07)
Basophils Absolute: 0.1 10*3/uL (ref 0.0–0.1)
Basophils Relative: 1 %
Eosinophils Absolute: 0.1 10*3/uL (ref 0.0–0.5)
Eosinophils Relative: 2 %
HCT: 34.2 % — ABNORMAL LOW (ref 36.0–46.0)
Hemoglobin: 11.6 g/dL — ABNORMAL LOW (ref 12.0–15.0)
Immature Granulocytes: 0 %
Lymphocytes Relative: 41 %
Lymphs Abs: 2.2 10*3/uL (ref 0.7–4.0)
MCH: 30.8 pg (ref 26.0–34.0)
MCHC: 33.9 g/dL (ref 30.0–36.0)
MCV: 90.7 fL (ref 80.0–100.0)
Monocytes Absolute: 0.3 10*3/uL (ref 0.1–1.0)
Monocytes Relative: 5 %
Neutro Abs: 2.7 10*3/uL (ref 1.7–7.7)
Neutrophils Relative %: 51 %
Platelet Count: 345 10*3/uL (ref 150–400)
RBC: 3.77 MIL/uL — ABNORMAL LOW (ref 3.87–5.11)
RDW: 13.2 % (ref 11.5–15.5)
WBC Count: 5.3 10*3/uL (ref 4.0–10.5)
nRBC: 0 % (ref 0.0–0.2)

## 2021-12-05 LAB — CMP (CANCER CENTER ONLY)
ALT: 7 U/L (ref 0–44)
AST: 14 U/L — ABNORMAL LOW (ref 15–41)
Albumin: 4.2 g/dL (ref 3.5–5.0)
Alkaline Phosphatase: 48 U/L (ref 38–126)
Anion gap: 6 (ref 5–15)
BUN: 10 mg/dL (ref 6–20)
CO2: 25 mmol/L (ref 22–32)
Calcium: 8.7 mg/dL — ABNORMAL LOW (ref 8.9–10.3)
Chloride: 105 mmol/L (ref 98–111)
Creatinine: 0.75 mg/dL (ref 0.44–1.00)
GFR, Estimated: >60 mL/min (ref >60)
Glucose, Bld: 101 mg/dL — ABNORMAL HIGH (ref 70–99)
Potassium: 3.4 mmol/L — ABNORMAL LOW (ref 3.5–5.1)
Sodium: 136 mmol/L (ref 135–145)
Total Bilirubin: 0.6 mg/dL (ref 0.3–1.2)
Total Protein: 7.8 g/dL (ref 6.5–8.1)

## 2021-12-05 LAB — IRON AND IRON BINDING CAPACITY (CC-WL,HP ONLY)
Iron: 63 ug/dL (ref 28–170)
Saturation Ratios: 16 % (ref 10.4–31.8)
TIBC: 389 ug/dL (ref 250–450)
UIBC: 326 ug/dL (ref 148–442)

## 2021-12-06 ENCOUNTER — Other Ambulatory Visit: Payer: Self-pay | Admitting: Physician Assistant

## 2021-12-06 ENCOUNTER — Encounter: Payer: Self-pay | Admitting: Physician Assistant

## 2021-12-06 ENCOUNTER — Telehealth: Payer: Self-pay

## 2021-12-06 LAB — FERRITIN: Ferritin: 15 ng/mL (ref 11–307)

## 2021-12-06 NOTE — Progress Notes (Signed)
[place] [phone]) [phone]   Fax:([phone]  PROGRESS NOTE  Patient Care Team: Servando Salina, MD as PCP - General (Obstetrics and Gynecology)  Hematological/Oncological History 1) Labs from PCP, Dr. Coralie Common: -06/21/2021: WBC 5.5, Hgb 8.6 (L), MCV 77 (L), Plt 356K, Vitamin B12 674, Folate 11.1  2) 07/11/2021: Establish care with [facility] Hematology/Oncology  3) 07/21/2021: Received IV monoferric 1000 mg x 1 dose  CHIEF COMPLAINTS/PURPOSE OF CONSULTATION:  Iron deficiency anemia  HISTORY OF PRESENTING ILLNESS:  Paige Fritz 31 y.o. female returns for a follow up for iron deficiency anemia. She was last seen on 09/06/2021 and in the interim, she denies any changes to her heatlh. She is unaccompanied for this visit today.   On exam today, Ms. Houchin reports her energy levels are stable but she has noticed heavier bleeding with her last menstrual cycle. She has noticed more clots during her cycle. She is not on any medications to minimize her menstrual bleeding. She denies any appetite changes and tries to incorporate iron rich foods into her diet.She denies any nausea, vomiting or abdominal pain. Her bowel habits are unchanged. She has noticed more cold sensitivity. She denies any fevers, chills, night sweats, shortness of breath, chest pain or cough.  She has no other complaints.  Rest of the 10 point ROS is below.  MEDICAL HISTORY:  Past Medical History:  Diagnosis Date   Anemia    Back pain    Headache(784.0)    UTI (urinary tract infection)     SURGICAL HISTORY: Past Surgical History:  Procedure Laterality Date   NO PAST SURGERIES      SOCIAL HISTORY: Social History   Socioeconomic History   Marital status: Single    Spouse name: Not on file   Number of children: Not on file   Years of education: Not on file   Highest education level: Not on file  Occupational History   Occupation: CNA, Occupational psychologist, [employer], Ship broker   Tobacco Use   Smoking status: Never   Smokeless tobacco: Never  Substance and Sexual Activity   Alcohol use: Yes    Comment: 1-2x/week   Drug use: No   Sexual activity: Yes    Birth control/protection: Pill  Other Topics Concern   Not on file  Social History Narrative   Not on file   Social Determinants of Health   Financial Resource Strain: Not on file  Food Insecurity: Not on file  Transportation Needs: Not on file  Physical Activity: Not on file  Stress: Not on file  Social Connections: Not on file  Intimate Partner Violence: Not on file    FAMILY HISTORY: Family History  Problem Relation Age of Onset   Obesity Mother    Hypertension Father     ALLERGIES:  has No Known Allergies.  MEDICATIONS:  Current Outpatient Medications  Medication Sig Dispense Refill   Vitamin D, Ergocalciferol, (DRISDOL) 1.25 MG (50000 UNIT) CAPS capsule Take 1 capsule (50,000 Units total) by mouth every 7 (seven) days. 4 capsule 0   No current facility-administered medications for this visit.    REVIEW OF SYSTEMS:   Constitutional: ( - ) fevers, ( - )  chills , ( - ) night sweats Eyes: ( - ) blurriness of vision, ( - ) double vision, ( - ) watery eyes Ears, nose, mouth, throat, and face: ( - ) mucositis, ( - ) sore throat Respiratory: ( - ) cough, ( -) dyspnea, ( - ) wheezes Cardiovascular: ( - )  palpitation, ( - ) chest discomfort, ( - ) lower extremity swelling Gastrointestinal:  ( - ) nausea, ( - ) heartburn, ( - ) change in bowel habits Skin: ( - ) abnormal skin rashes Lymphatics: ( - ) new lymphadenopathy, ( - ) easy bruising Neurological: ( - ) numbness, ( - ) tingling, ( - ) new weaknesses Behavioral/Psych: ( - ) mood change, ( - ) new changes  All other systems were reviewed with the patient and are negative.  PHYSICAL EXAMINATION: ECOG PERFORMANCE STATUS: 1 - Symptomatic but completely ambulatory  Vitals:   12/05/21 1345  BP: 132/72  Pulse: 84  Resp: 20  Temp: (!)  97.5 F (36.4 C)  SpO2: 100%    Filed Weights   12/05/21 1345  Weight: 201 lb 8 oz (91.4 kg)     GENERAL: well appearing female in NAD  SKIN: skin color, texture, turgor are normal, no rashes or significant lesions EYES: conjunctiva are pink and non-injected, sclera clear LUNGS: clear to auscultation and percussion with normal breathing effort HEART: regular rate & rhythm and no murmurs and trace bilateral lower extremity edema Musculoskeletal: no cyanosis of digits and no clubbing  PSYCH: alert & oriented x 3, fluent speech NEURO: no focal motor/sensory deficits  LABORATORY DATA:  I have reviewed the data as listed    Latest Ref Rng & Units 12/05/2021    3:40 PM 09/06/2021    2:31 PM 07/11/2021    3:23 PM  CBC  WBC 4.0 - 10.5 K/uL 5.3  6.3  5.1   Hemoglobin 12.0 - 15.0 g/dL 11.6  11.4  8.0   Hematocrit 36.0 - 46.0 % 34.2  35.2  27.0   Platelets 150 - 400 K/uL 345  327  388        Latest Ref Rng & Units 12/05/2021    3:40 PM 07/11/2021    3:23 PM 06/21/2021    3:31 PM  CMP  Glucose 70 - 99 mg/dL 101  89  77   BUN 6 - 20 mg/dL '10  8  11   '$ Creatinine 0.44 - 1.00 mg/dL 0.75  0.64  0.71   Sodium 135 - 145 mmol/L 136  135  134   Potassium 3.5 - 5.1 mmol/L 3.4  3.9  4.1   Chloride 98 - 111 mmol/L 105  104  101   CO2 22 - 32 mmol/L '25  27  20   '$ Calcium 8.9 - 10.3 mg/dL 8.7  8.9  9.2   Total Protein 6.5 - 8.1 g/dL 7.8  7.1  7.2   Total Bilirubin 0.3 - 1.2 mg/dL 0.6  0.3  <0.2   Alkaline Phos 38 - 126 U/L 48  46  57   AST 15 - 41 U/L '14  12  15   '$ ALT 0 - 44 U/L '7  6  5     '$ ASSESSMENT & PLAN Paige Fritz is a 31 y.o. female returns for a follow up for iron deficiency anemia.  #Iron deficiency anemia 2/2 heavy menstrual bleeding --Unable to tolerate iron tablets due to constipation --Received IV monoferric 1000 mg x 1 dose on 07/21/2021.  --Patient is under the care of OB/GYN, Dr. Maudry Diego. Advised patient to follow up to discuss interventions to help decrease bleeding.  Patient is not interested in taking birth control.  --Labs today show mild anemia with Hgb 11.6, MCV 90.7. Iron panel shows worsening deficiency with saturation 16%, Ferritin 15.  --Recommend another round of IV  monoferric to help bolster iron levels.  --RTC in 3 months to repeat labs.   Orders Placed This Encounter  Procedures   CBC with Differential ([place] Only)    Standing Status:   Future    Standing Expiration Date:   12/07/2022   CMP ([facility] only)    Standing Status:   Future    Standing Expiration Date:   12/07/2022   Ferritin    Standing Status:   Future    Standing Expiration Date:   12/07/2022   Iron and Iron Binding Capacity ([facility] only)    Standing Status:   Future    Standing Expiration Date:   12/07/2022    All questions were answered. The patient knows to call the clinic with any problems, questions or concerns.  I have spent a total 25 minutes minutes of face-to-face and non-face-to-face time, preparing to see the patient, performing a medically appropriate examination, counseling and educating the patient, ordering tests, documenting clinical information in the electronic health record and care coordination.    Dede Query, PA-C Department of Hematology/Oncology [place] at [facility] Phone: [phone]

## 2021-12-06 NOTE — Telephone Encounter (Signed)
Pt advised of the need for IV iron.  She has an appt scheduled for 8/8

## 2021-12-06 NOTE — Telephone Encounter (Signed)
-----   Message from Lincoln Brigham, [place] sent at 12/06/2021  9:10 AM EDT ----- Please notify patient that her iron levels have decreased so we will arrange for IV iron infusion next week either on 8/8 or 8/9 at [facility] infusion center.

## 2021-12-11 ENCOUNTER — Encounter: Payer: Self-pay | Admitting: Physician Assistant

## 2021-12-12 ENCOUNTER — Telehealth: Payer: Self-pay | Admitting: Physician Assistant

## 2021-12-12 ENCOUNTER — Inpatient Hospital Stay: Payer: No Typology Code available for payment source

## 2021-12-12 NOTE — Telephone Encounter (Signed)
Per 8/8 schmsg r/s missed appointment and left message for pt with details

## 2021-12-13 ENCOUNTER — Encounter (INDEPENDENT_AMBULATORY_CARE_PROVIDER_SITE_OTHER): Payer: Self-pay

## 2021-12-14 ENCOUNTER — Ambulatory Visit: Payer: No Typology Code available for payment source

## 2021-12-19 ENCOUNTER — Ambulatory Visit: Payer: No Typology Code available for payment source

## 2021-12-19 ENCOUNTER — Other Ambulatory Visit: Payer: Self-pay

## 2021-12-19 ENCOUNTER — Inpatient Hospital Stay: Payer: No Typology Code available for payment source

## 2021-12-19 VITALS — BP 140/78 | HR 81 | Temp 98.1°F | Resp 18 | Wt 202.2 lb

## 2021-12-19 DIAGNOSIS — D500 Iron deficiency anemia secondary to blood loss (chronic): Secondary | ICD-10-CM

## 2021-12-19 MED ORDER — SODIUM CHLORIDE 0.9 % IV SOLN
200.0000 mg | Freq: Once | INTRAVENOUS | Status: AC
  Administered 2021-12-19: 200 mg via INTRAVENOUS
  Filled 2021-12-19: qty 200

## 2021-12-19 MED ORDER — SODIUM CHLORIDE 0.9 % IV SOLN: Freq: Once | INTRAVENOUS | Status: AC

## 2021-12-19 NOTE — Patient Instructions (Signed)

## 2021-12-26 ENCOUNTER — Other Ambulatory Visit: Payer: Self-pay

## 2021-12-26 ENCOUNTER — Ambulatory Visit: Payer: No Typology Code available for payment source

## 2021-12-26 ENCOUNTER — Inpatient Hospital Stay: Payer: No Typology Code available for payment source

## 2021-12-26 ENCOUNTER — Other Ambulatory Visit: Payer: Self-pay | Admitting: Physician Assistant

## 2021-12-26 VITALS — BP 130/79 | HR 88 | Temp 99.0°F | Resp 20

## 2021-12-26 DIAGNOSIS — D500 Iron deficiency anemia secondary to blood loss (chronic): Secondary | ICD-10-CM

## 2021-12-26 MED ORDER — SODIUM CHLORIDE 0.9 % IV SOLN
200.0000 mg | Freq: Once | INTRAVENOUS | Status: AC
  Administered 2021-12-26: 200 mg via INTRAVENOUS
  Filled 2021-12-26: qty 200

## 2021-12-26 MED ORDER — SODIUM CHLORIDE 0.9 % IV SOLN: Freq: Once | INTRAVENOUS | Status: AC

## 2021-12-26 NOTE — Progress Notes (Signed)
Pt. declined to stay for full 30 minute post observation. Vital signs stable, left via ambulation, no respiratory distress noted.

## 2021-12-26 NOTE — Patient Instructions (Signed)

## 2021-12-27 ENCOUNTER — Other Ambulatory Visit (INDEPENDENT_AMBULATORY_CARE_PROVIDER_SITE_OTHER): Payer: Self-pay | Admitting: Family Medicine

## 2021-12-27 DIAGNOSIS — E559 Vitamin D deficiency, unspecified: Secondary | ICD-10-CM

## 2022-01-02 ENCOUNTER — Other Ambulatory Visit: Payer: Self-pay

## 2022-01-02 ENCOUNTER — Inpatient Hospital Stay: Payer: No Typology Code available for payment source

## 2022-01-02 ENCOUNTER — Ambulatory Visit: Payer: No Typology Code available for payment source

## 2022-01-02 VITALS — BP 145/75 | HR 88 | Temp 98.9°F | Resp 17

## 2022-01-02 DIAGNOSIS — D500 Iron deficiency anemia secondary to blood loss (chronic): Secondary | ICD-10-CM

## 2022-01-02 MED ORDER — SODIUM CHLORIDE 0.9 % IV SOLN: Freq: Once | INTRAVENOUS | Status: AC

## 2022-01-02 MED ORDER — SODIUM CHLORIDE 0.9 % IV SOLN
200.0000 mg | Freq: Once | INTRAVENOUS | Status: AC
  Administered 2022-01-02: 200 mg via INTRAVENOUS
  Filled 2022-01-02: qty 200

## 2022-01-02 NOTE — Patient Instructions (Signed)

## 2022-01-09 ENCOUNTER — Inpatient Hospital Stay: Payer: No Typology Code available for payment source | Attending: Physician Assistant

## 2022-01-09 ENCOUNTER — Other Ambulatory Visit: Payer: Self-pay

## 2022-01-09 ENCOUNTER — Ambulatory Visit: Payer: No Typology Code available for payment source

## 2022-01-09 VITALS — BP 135/76 | HR 94 | Temp 98.8°F | Resp 17

## 2022-01-09 DIAGNOSIS — N920 Excessive and frequent menstruation with regular cycle: Secondary | ICD-10-CM | POA: Insufficient documentation

## 2022-01-09 DIAGNOSIS — Z79899 Other long term (current) drug therapy: Secondary | ICD-10-CM | POA: Diagnosis not present

## 2022-01-09 DIAGNOSIS — D500 Iron deficiency anemia secondary to blood loss (chronic): Secondary | ICD-10-CM | POA: Diagnosis present

## 2022-01-09 DIAGNOSIS — Z8249 Family history of ischemic heart disease and other diseases of the circulatory system: Secondary | ICD-10-CM | POA: Insufficient documentation

## 2022-01-09 DIAGNOSIS — Z8349 Family history of other endocrine, nutritional and metabolic diseases: Secondary | ICD-10-CM | POA: Insufficient documentation

## 2022-01-09 MED ORDER — SODIUM CHLORIDE 0.9 % IV SOLN
200.0000 mg | Freq: Once | INTRAVENOUS | Status: AC
  Administered 2022-01-09: 200 mg via INTRAVENOUS
  Filled 2022-01-09: qty 200

## 2022-01-09 MED ORDER — SODIUM CHLORIDE 0.9 % IV SOLN: Freq: Once | INTRAVENOUS | Status: AC

## 2022-01-09 NOTE — Patient Instructions (Signed)

## 2022-01-16 ENCOUNTER — Other Ambulatory Visit: Payer: Self-pay

## 2022-01-16 ENCOUNTER — Inpatient Hospital Stay: Payer: No Typology Code available for payment source

## 2022-01-16 VITALS — BP 119/74 | HR 87 | Temp 98.4°F | Resp 17

## 2022-01-16 DIAGNOSIS — D500 Iron deficiency anemia secondary to blood loss (chronic): Secondary | ICD-10-CM | POA: Diagnosis not present

## 2022-01-16 MED ORDER — SODIUM CHLORIDE 0.9 % IV SOLN: Freq: Once | INTRAVENOUS | Status: AC

## 2022-01-16 MED ORDER — SODIUM CHLORIDE 0.9 % IV SOLN
200.0000 mg | Freq: Once | INTRAVENOUS | Status: AC
  Administered 2022-01-16: 200 mg via INTRAVENOUS
  Filled 2022-01-16: qty 200

## 2022-01-16 NOTE — Progress Notes (Signed)
Patient declined to stay for 30-minute post observation.

## 2022-03-16 ENCOUNTER — Other Ambulatory Visit: Payer: Self-pay

## 2022-03-16 ENCOUNTER — Inpatient Hospital Stay: Payer: No Typology Code available for payment source | Attending: Physician Assistant

## 2022-03-16 DIAGNOSIS — D500 Iron deficiency anemia secondary to blood loss (chronic): Secondary | ICD-10-CM

## 2022-03-16 DIAGNOSIS — D509 Iron deficiency anemia, unspecified: Secondary | ICD-10-CM | POA: Diagnosis not present

## 2022-03-16 LAB — CBC WITH DIFFERENTIAL (CANCER CENTER ONLY)
Abs Immature Granulocytes: 0.01 10*3/uL (ref 0.00–0.07)
Basophils Absolute: 0 10*3/uL (ref 0.0–0.1)
Basophils Relative: 1 %
Eosinophils Absolute: 0.1 10*3/uL (ref 0.0–0.5)
Eosinophils Relative: 2 %
HCT: 37 % (ref 36.0–46.0)
Hemoglobin: 12.8 g/dL (ref 12.0–15.0)
Immature Granulocytes: 0 %
Lymphocytes Relative: 41 %
Lymphs Abs: 2.2 10*3/uL (ref 0.7–4.0)
MCH: 31.7 pg (ref 26.0–34.0)
MCHC: 34.6 g/dL (ref 30.0–36.0)
MCV: 91.6 fL (ref 80.0–100.0)
Monocytes Absolute: 0.3 10*3/uL (ref 0.1–1.0)
Monocytes Relative: 6 %
Neutro Abs: 2.8 10*3/uL (ref 1.7–7.7)
Neutrophils Relative %: 50 %
Platelet Count: 300 10*3/uL (ref 150–400)
RBC: 4.04 MIL/uL (ref 3.87–5.11)
RDW: 13.9 % (ref 11.5–15.5)
WBC Count: 5.5 10*3/uL (ref 4.0–10.5)
nRBC: 0 % (ref 0.0–0.2)

## 2022-03-16 LAB — CMP (CANCER CENTER ONLY)
ALT: 8 U/L (ref 0–44)
AST: 13 U/L — ABNORMAL LOW (ref 15–41)
Albumin: 4.2 g/dL (ref 3.5–5.0)
Alkaline Phosphatase: 48 U/L (ref 38–126)
Anion gap: 5 (ref 5–15)
BUN: 9 mg/dL (ref 6–20)
CO2: 27 mmol/L (ref 22–32)
Calcium: 9.4 mg/dL (ref 8.9–10.3)
Chloride: 103 mmol/L (ref 98–111)
Creatinine: 0.68 mg/dL (ref 0.44–1.00)
GFR, Estimated: >60 mL/min (ref >60)
Glucose, Bld: 86 mg/dL (ref 70–99)
Potassium: 3.7 mmol/L (ref 3.5–5.1)
Sodium: 135 mmol/L (ref 135–145)
Total Bilirubin: 0.3 mg/dL (ref 0.3–1.2)
Total Protein: 8.1 g/dL (ref 6.5–8.1)

## 2022-03-16 LAB — IRON AND IRON BINDING CAPACITY (CC-WL,HP ONLY)
Iron: 62 ug/dL (ref 28–170)
Saturation Ratios: 19 % (ref 10.4–31.8)
TIBC: 329 ug/dL (ref 250–450)
UIBC: 267 ug/dL (ref 148–442)

## 2022-03-16 LAB — FERRITIN: Ferritin: 68 ng/mL (ref 11–307)

## 2022-03-19 ENCOUNTER — Other Ambulatory Visit: Payer: No Typology Code available for payment source

## 2022-03-21 ENCOUNTER — Ambulatory Visit: Payer: No Typology Code available for payment source | Admitting: Physician Assistant

## 2022-03-21 ENCOUNTER — Inpatient Hospital Stay: Payer: No Typology Code available for payment source | Admitting: Physician Assistant

## 2022-03-21 ENCOUNTER — Encounter: Payer: Self-pay | Admitting: Physician Assistant

## 2022-03-21 ENCOUNTER — Inpatient Hospital Stay (HOSPITAL_BASED_OUTPATIENT_CLINIC_OR_DEPARTMENT_OTHER): Payer: No Typology Code available for payment source | Admitting: Physician Assistant

## 2022-03-21 DIAGNOSIS — D500 Iron deficiency anemia secondary to blood loss (chronic): Secondary | ICD-10-CM | POA: Diagnosis not present

## 2022-03-21 NOTE — Progress Notes (Signed)
[place] Telephone:(336) [phone]   Fax:(336) [phone]  PROGRESS NOTE  Patient Care Team: Servando Salina, MD as PCP - General (Obstetrics and Gynecology)  I connected with Paige Fritz  on 03/21/22 by telephone visit and verified that I am speaking with the correct person using two identifiers.   I discussed the limitations, risks, security and privacy concerns of performing an evaluation and management service by telemedicine and the availability of in-person appointments. I also discussed with the patient that there may be a patient responsible charge related to this service. The patient expressed understanding and agreed to proceed.  Patient's location: Patient's car Provider's location: Office   Hematological/Oncological History 1) Labs from PCP, Dr. Coralie Common: -06/21/2021: WBC 5.5, Hgb 8.6 (L), MCV 77 (L), Plt 356K, Vitamin B12 674, Folate 11.1  2) 07/11/2021: Establish care with [facility] Hematology/Oncology  3) 07/21/2021: Received IV monoferric 1000 mg x 1 dose  4) 12/19/2021-01/16/2022: Received IV venofer 200 mg x 5 doses  CHIEF COMPLAINTS/PURPOSE OF CONSULTATION:  Iron deficiency anemia  HISTORY OF PRESENTING ILLNESS:  Paige Fritz 31 y.o. female returns for a follow up for iron deficiency anemia. She was last seen on 12/06/2021 and in the interim, she received IV venofer 200 mg x 5 doses.   On exam today, Ms. Bujak reports her energy levels have noticeably improved after receiving IV iron. She is able to complete all her ADLs independently. Her appetite and weight are unchanged. She denies any nausea, vomiting or abdominal pain. Her bowel habits are unchanged. Her menstrual bleeding is persistent with 2-3 days of heavy bleeding with clots. She has yet to follow up with her gynecologist since our last visit. She denies any fevers, chills, night sweats, shortness of breath, chest pain or cough.  She has no other complaints.  Rest of  the 10 point ROS is below.  MEDICAL HISTORY:  Past Medical History:  Diagnosis Date   Anemia    Back pain    Headache(784.0)    UTI (urinary tract infection)     SURGICAL HISTORY: Past Surgical History:  Procedure Laterality Date   NO PAST SURGERIES      SOCIAL HISTORY: Social History   Socioeconomic History   Marital status: Single    Spouse name: Not on file   Number of children: Not on file   Years of education: Not on file   Highest education level: Not on file  Occupational History   Occupation: CNA, Occupational psychologist, Transit, Ship broker  Tobacco Use   Smoking status: Never   Smokeless tobacco: Never  Substance and Sexual Activity   Alcohol use: Yes    Comment: 1-2x/week   Drug use: No   Sexual activity: Yes    Birth control/protection: Pill  Other Topics Concern   Not on file  Social History Narrative   Not on file   Social Determinants of Health   Financial Resource Strain: Not on file  Food Insecurity: Not on file  Transportation Needs: Not on file  Physical Activity: Not on file  Stress: Not on file  Social Connections: Not on file  Intimate Partner Violence: Not on file    FAMILY HISTORY: Family History  Problem Relation Age of Onset   Obesity Mother    Hypertension Father     ALLERGIES:  has No Known Allergies.  MEDICATIONS:  Current Outpatient Medications  Medication Sig Dispense Refill   Vitamin D, Ergocalciferol, (DRISDOL) 1.25 MG (50000 UNIT) CAPS capsule Take 1 capsule (50,000  Units total) by mouth every 7 (seven) days. 4 capsule 0   No current facility-administered medications for this visit.    REVIEW OF SYSTEMS:   Constitutional: ( - ) fevers, ( - )  chills , ( - ) night sweats Eyes: ( - ) blurriness of vision, ( - ) double vision, ( - ) watery eyes Ears, nose, mouth, throat, and face: ( - ) mucositis, ( - ) sore throat Respiratory: ( - ) cough, ( -) dyspnea, ( - ) wheezes Cardiovascular: ( - ) palpitation, ( - ) chest  discomfort, ( - ) lower extremity swelling Gastrointestinal:  ( - ) nausea, ( - ) heartburn, ( - ) change in bowel habits Skin: ( - ) abnormal skin rashes Lymphatics: ( - ) new lymphadenopathy, ( - ) easy bruising Neurological: ( - ) numbness, ( - ) tingling, ( - ) new weaknesses Behavioral/Psych: ( - ) mood change, ( - ) new changes  All other systems were reviewed with the patient and are negative.  PHYSICAL EXAMINATION: Physical exam deferred due to virtual visit  LABORATORY DATA:  I have reviewed the data as listed    Latest Ref Rng & Units 03/16/2022    3:35 PM 12/05/2021    3:40 PM 09/06/2021    2:31 PM  CBC  WBC 4.0 - 10.5 K/uL 5.5  5.3  6.3   Hemoglobin 12.0 - 15.0 g/dL 12.8  11.6  11.4   Hematocrit 36.0 - 46.0 % 37.0  34.2  35.2   Platelets 150 - 400 K/uL 300  345  327        Latest Ref Rng & Units 03/16/2022    3:35 PM 12/05/2021    3:40 PM 07/11/2021    3:23 PM  CMP  Glucose 70 - 99 mg/dL 86  101  89   BUN 6 - 20 mg/dL '9  10  8   '$ Creatinine 0.44 - 1.00 mg/dL 0.68  0.75  0.64   Sodium 135 - 145 mmol/L 135  136  135   Potassium 3.5 - 5.1 mmol/L 3.7  3.4  3.9   Chloride 98 - 111 mmol/L 103  105  104   CO2 22 - 32 mmol/L '27  25  27   '$ Calcium 8.9 - 10.3 mg/dL 9.4  8.7  8.9   Total Protein 6.5 - 8.1 g/dL 8.1  7.8  7.1   Total Bilirubin 0.3 - 1.2 mg/dL 0.3  0.6  0.3   Alkaline Phos 38 - 126 U/L 48  48  46   AST 15 - 41 U/L '13  14  12   '$ ALT 0 - 44 U/L '8  7  6     '$ ASSESSMENT & PLAN Paige Fritz is a 31 y.o. female returns for a follow up for iron deficiency anemia.  #Iron deficiency anemia 2/2 heavy menstrual bleeding --Unable to tolerate iron tablets due to constipation --Patient is under the care of OB/GYN, Dr. Maudry Diego. Advised patient to follow up to discuss interventions to help decrease bleeding. Patient is not interested in taking birth control.  --Most recently received IV venofer 200 mg x 5 doses from 12/19/2021-01/16/2022 --Labs from 03/16/2022 showed  anemia has resolved with Hgb of 12.8. Iron levels have improved with serum iron 62, saturation 19%, ferritin 68. --No need for additional IV iron at this time.  --RTC in 3 months to repeat labs.   No orders of the defined types were placed in this encounter.  All questions were answered. The patient knows to call the clinic with any problems, questions or concerns.  I have spent a total 25 minutes minutes of non-face-to-face time, preparing to see the patient, counseling and educating the patient, documenting clinical information in the electronic health record and care coordination.    Dede Query, PA-C Department of Hematology/Oncology [place] at [facility] Phone: [phone]

## 2022-03-22 ENCOUNTER — Telehealth: Payer: Self-pay | Admitting: Physician Assistant

## 2022-03-22 NOTE — Telephone Encounter (Signed)
Per 11/15 los called and left message for pt about appointment

## 2022-04-16 ENCOUNTER — Encounter: Payer: Self-pay | Admitting: Physician Assistant

## 2022-04-21 ENCOUNTER — Encounter: Payer: Self-pay | Admitting: Physician Assistant

## 2022-04-24 ENCOUNTER — Ambulatory Visit (INDEPENDENT_AMBULATORY_CARE_PROVIDER_SITE_OTHER): Payer: No Typology Code available for payment source | Admitting: Family

## 2022-04-24 ENCOUNTER — Encounter: Payer: Self-pay | Admitting: Family

## 2022-04-24 VITALS — BP 118/70 | Temp 98.4°F | Ht 61.0 in | Wt 210.4 lb

## 2022-04-24 DIAGNOSIS — L739 Follicular disorder, unspecified: Secondary | ICD-10-CM | POA: Diagnosis not present

## 2022-04-24 MED ORDER — SULFAMETHOXAZOLE-TRIMETHOPRIM 800-160 MG PO TABS: 1.0000 | ORAL_TABLET | Freq: Two times a day (BID) | ORAL | 0 refills | Status: DC

## 2022-04-24 MED ORDER — TRIAMCINOLONE ACETONIDE 0.1 % EX CREA: 1.0000 | TOPICAL_CREAM | Freq: Two times a day (BID) | CUTANEOUS | 0 refills | Status: AC

## 2022-04-24 NOTE — Progress Notes (Unsigned)
   Acute Office Visit  Subjective:     Patient ID: Paige Fritz, female    DOB: 1991-01-02, 31 y.o.   MRN: [id number]  Chief Complaint  Patient presents with  . Rash    Pt has a rash on the left inner thigh    HPI Patient is in today with c/o a rash on her left inner thigh. This has occurred previously on her right outer thigh that resolved with antibiotics and triamcinolone cream. She has never had the rash cultured. She has redness surrounding the site and swelling. It is slightly itchy but no pain.   Review of Systems  Constitutional: Negative.   Respiratory: Negative.    Cardiovascular: Negative.   Skin:  Positive for itching and rash.       left inner thigh  All other systems reviewed and are negative.      Objective:    BP 118/70   Temp 98.4 F (36.9 C)   Ht '5\' 1"'$  (1.549 m)   Wt 210 lb 6.4 oz (95.4 kg)   BMI 39.75 kg/m    Physical Exam Vitals and nursing note reviewed.  Constitutional:      Appearance: Normal appearance.  Cardiovascular:     Rate and Rhythm: Normal rate and regular rhythm.     Pulses: Normal pulses.     Heart sounds: Normal heart sounds.  Pulmonary:     Effort: Pulmonary effort is normal.     Breath sounds: Normal breath sounds.  Abdominal:     General: Abdomen is flat.     Palpations: Abdomen is soft.  Musculoskeletal:        General: Normal range of motion.     Cervical back: Normal range of motion and neck supple.  Skin:    General: Skin is warm.     Findings: Erythema and rash present.     Comments: Pustular rash noted to the left inner thigh with surrounding redness. Nontender. Mildly pruritic. Wound culture obtained  Neurological:     General: No focal deficit present.     Mental Status: She is alert and oriented to person, place, and time.   No results found for any visits on 04/24/22.      Assessment & Plan:   Problem List Items Addressed This Visit   None Visit Diagnoses     Folliculitis    -  Primary    Relevant Orders   Wound culture       Meds ordered this encounter  Medications  . triamcinolone cream (KENALOG) 0.1 %    Sig: Apply 1 Application topically 2 (two) times daily.    Dispense:  30 g    Refill:  0  . sulfamethoxazole-trimethoprim (BACTRIM DS) 800-160 MG tablet    Sig: Take 1 tablet by mouth 2 (two) times daily.    Dispense:  20 tablet    Refill:  0   Call the office if symptoms worsen or persist. Recheck as scheduled and sooner as needed.   No follow-ups on file.  Kennyth Arnold, FNP

## 2022-04-27 LAB — WOUND CULTURE
MICRO NUMBER:: 14335073
SPECIMEN QUALITY:: ADEQUATE

## 2022-05-08 ENCOUNTER — Encounter: Payer: Self-pay | Admitting: Physician Assistant

## 2022-05-11 ENCOUNTER — Ambulatory Visit (INDEPENDENT_AMBULATORY_CARE_PROVIDER_SITE_OTHER): Payer: 59 | Admitting: Family Medicine

## 2022-05-11 ENCOUNTER — Encounter: Payer: Self-pay | Admitting: Family Medicine

## 2022-05-11 VITALS — BP 110/70 | HR 70 | Temp 97.7°F | Ht 61.0 in | Wt 214.2 lb

## 2022-05-11 DIAGNOSIS — Z0000 Encounter for general adult medical examination without abnormal findings: Secondary | ICD-10-CM | POA: Diagnosis not present

## 2022-05-11 DIAGNOSIS — M549 Dorsalgia, unspecified: Secondary | ICD-10-CM

## 2022-05-11 DIAGNOSIS — Z1159 Encounter for screening for other viral diseases: Secondary | ICD-10-CM | POA: Diagnosis not present

## 2022-05-11 DIAGNOSIS — N62 Hypertrophy of breast: Secondary | ICD-10-CM | POA: Diagnosis not present

## 2022-05-11 NOTE — Progress Notes (Unsigned)
Phone [phone]   Subjective:   Patient is a 32 y.o. female presenting for annual physical.    Chief Complaint  Patient presents with   Establish Care    Need new pcp, had been going to gyn Would like a CPE if have time    New annual- Fibroids-anemia-getting iron infusions as PO too constipating.  Seeing gyn re procedures.    See problem oriented charting- ROS- ROS: Gen: no fever, chills  Skin: no rash, itching ENT: no ear pain, ear drainage, nasal congestion, rhinorrhea, sinus pressure, sore throat Eyes: no blurry vision, double vision Resp: no cough, wheeze,SOB CV: no CP, palpitations, LE edema,  GI: no heartburn, n/v/d/, abd pain.  H/o constipation GU: no dysuria, urgency, frequency, hematuria MSK: no joint pain, myalgias,  + back pain, shoulders, upper back.  Breasts at least 34J. Tried wt loss program, but gained d/t sitting at desk.  Breasts push on abd so pain there as well.  Hard to exercise d/t breast pain and back pain.  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 Works 16 hrs/day most day. Walks some at work. Not want meds for wt.  Eats out Neuro: no dizziness, headache, weakness, vertigo Psych: no depression, anxiety, insomnia, SI   The following were reviewed and entered/updated in epic: Past Medical History:  Diagnosis Date   Anemia    fibroids.  iron infusion   Anxiety    Back pain    Headache(784.0)    UTI (urinary tract infection)    Patient Active Problem List   Diagnosis Date Noted   Microcytic anemia 07/11/2021   Iron deficiency anemia 07/11/2021   Iron deficiency anemia due to chronic blood loss 07/11/2021   Past Surgical History:  Procedure Laterality Date   NO PAST SURGERIES      Family History  Problem Relation Age of Onset   Obesity Mother    Hypertension Father     Medications- reviewed and updated Current Outpatient Medications  Medication Sig Dispense Refill    triamcinolone cream (KENALOG) 0.1 % Apply 1 Application topically 2 (two) times daily. 30 g 0   VITAMIN D PO Take 1 Capful by mouth daily.     Vitamin D, Ergocalciferol, (DRISDOL) 1.25 MG (50000 UNIT) CAPS capsule Take 1 capsule (50,000 Units total) by mouth every 7 (seven) days. (Patient not taking: Reported on 05/11/2022) 4 capsule 0   No current facility-administered medications for this visit.    Allergies-reviewed and updated No Known Allergies  Social History   Social History Narrative   Not on file   Objective  Objective:  BP 110/70   Pulse 70   Temp 97.7 F (36.5 C) (Temporal)   Ht '5\' 1"'$  (1.549 m)   Wt 214 lb 4 oz (97.2 kg)   LMP 04/21/2022 (Exact Date)   SpO2 100%   BMI 40.48 kg/m  Physical Exam  Gen: WDWN NAD MOAAF HEENT: NCAT, conjunctiva not injected, sclera nonicteric TM WNL B, OP moist, no exudates  NECK:  supple, no thyromegaly, no nodes, no carotid bruits CARDIAC: RRR, S1S2+, no murmur. DP 2+B LUNGS: CTAB. No wheezes ABDOMEN:  BS+, soft, NTND, No HSM, no masses EXT:  no edema MSK: no gross abnormalities. MS 5/5 all 4 NEURO: A&O x3.  CN II-XII intact.  PSYCH: normal mood. Good eye contact   Very large breasts-hyperpigmentation all around straps and shoulder indents  Assessment and Plan   Health Maintenance counseling: 1. Anticipatory guidance: Patient counseled regarding regular dental exams q6 months, eye exams,  avoiding smoking and second hand smoke, limiting alcohol to 1 beverage per day, no illicit drugs.   2. Risk factor reduction:  Advised patient of need for regular exercise and diet rich and fruits and vegetables to reduce risk of heart attack and stroke. Exercise- hard d/t breasts.  Wt Readings from Last 3 Encounters:  05/11/22 214 lb 4 oz (97.2 kg)  04/24/22 210 lb 6.4 oz (95.4 kg)  12/19/21 202 lb 4 oz (91.7 kg)   3. Immunizations/screenings/ancillary studies Immunization History  Administered Date(s) Administered   Influenza Inj  Mdck Quad Pf 02/01/2022   Health Maintenance Due  Topic Date Due   COVID-19 Vaccine (1) Never done    4. Cervical cancer screening: utd 5. Skin cancer screening- advised regular sunscreen use. Denies worrisome, changing, or new skin lesions.  6. Birth control/STD check:  bisexual.  7. Smoking associated screening: non smoker 8. Alcohol screening: occ  Problem List Items Addressed This Visit   None Visit Diagnoses     Wellness examination    -  Primary   Relevant Orders   Lipid panel   Encounter for hepatitis C screening test for low risk patient       Relevant Orders   Hepatitis C antibody   Upper back pain       Relevant Orders   Ambulatory referral to Plastic Surgery   Macromastia       Relevant Orders   Ambulatory referral to Plastic Surgery       Recommended follow up: annual No follow-ups on file. Future Appointments  Date Time Provider [place]  06/22/2022  2:30 PM [facility] None  06/22/2022  3:00 PM Dede Query T, PA-C [facility] None    Lab/Order associations:5 hrs fasting   ICD-10-CM   1. Wellness examination  Z00.00 Lipid panel    2. Encounter for hepatitis C screening test for low risk patient  Z11.59 Hepatitis C antibody    3. Upper back pain  M54.9 Ambulatory referral to Plastic Surgery    4. Macromastia  N62 Ambulatory referral to Plastic Surgery      No orders of the defined types were placed in this encounter.    Wellington Hampshire, MD

## 2022-05-11 NOTE — Patient Instructions (Signed)
Welcome to [facility]! It was a pleasure meeting you today.  As discussed, Please schedule a 12 month follow up visit today.  Referring for breast reductioin  PLEASE NOTE:  If you had any LAB tests please let us know if you have not heard back within a few days. You may see your results on MyChart before we have a chance to review them but we will give you a call once they are reviewed by [place]. If we ordered any REFERRALS today, please let us know if you have not heard from their office within the next week.  Let us know through MyChart if you are needing REFILLS, or have your pharmacy send [place] the request. You can also use MyChart to communicate with me or any office staff.  Please try these tips to maintain a healthy lifestyle:  Eat most of your calories during the day when you are active. Eliminate processed foods including packaged sweets (pies, cakes, cookies), reduce intake of potatoes, white bread, white pasta, and white rice. Look for whole grain options, oat flour or almond flour.  Each meal should contain half fruits/vegetables, one quarter protein, and one quarter carbs (no bigger than a computer mouse).  Cut down on sweet beverages. This includes juice, soda, and sweet tea. Also watch fruit intake, though this is a healthier sweet option, it still contains natural sugar! Limit to 3 servings daily.  Drink at least 1 glass of water with each meal and aim for at least 8 glasses per day  Exercise at least 150 minutes every week.

## 2022-05-15 DIAGNOSIS — Z1159 Encounter for screening for other viral diseases: Secondary | ICD-10-CM | POA: Diagnosis not present

## 2022-05-15 LAB — LIPID PANEL
Cholesterol: 256 mg/dL — ABNORMAL HIGH (ref 0–200)
HDL: 65.9 mg/dL (ref >39.00)
LDL Cholesterol: 175 mg/dL — ABNORMAL HIGH (ref 0–99)
NonHDL: 190.31
Total CHOL/HDL Ratio: 4
Triglycerides: 75 mg/dL (ref 0.0–149.0)
VLDL: 15 mg/dL (ref 0.0–40.0)

## 2022-05-16 LAB — HEPATITIS C ANTIBODY: Hepatitis C Ab: NONREACTIVE

## 2022-05-16 NOTE — Progress Notes (Signed)
Your cholesterol levels are elevated.  Work on low cholesterol and lower carbs/sugars diet and  get exercise to try to lower your cholesterol.  Repeat in 6 months

## 2022-05-18 ENCOUNTER — Encounter: Payer: Self-pay | Admitting: Family Medicine

## 2022-06-21 ENCOUNTER — Other Ambulatory Visit: Payer: Self-pay | Admitting: Physician Assistant

## 2022-06-21 DIAGNOSIS — D500 Iron deficiency anemia secondary to blood loss (chronic): Secondary | ICD-10-CM

## 2022-06-22 ENCOUNTER — Inpatient Hospital Stay: Payer: 59 | Attending: Physician Assistant

## 2022-06-22 ENCOUNTER — Inpatient Hospital Stay: Payer: 59 | Admitting: Physician Assistant

## 2022-06-22 VITALS — BP 137/83 | HR 73 | Temp 97.5°F | Resp 20 | Wt 215.6 lb

## 2022-06-22 DIAGNOSIS — Z8249 Family history of ischemic heart disease and other diseases of the circulatory system: Secondary | ICD-10-CM | POA: Diagnosis not present

## 2022-06-22 DIAGNOSIS — Z8349 Family history of other endocrine, nutritional and metabolic diseases: Secondary | ICD-10-CM | POA: Diagnosis not present

## 2022-06-22 DIAGNOSIS — D500 Iron deficiency anemia secondary to blood loss (chronic): Secondary | ICD-10-CM

## 2022-06-22 DIAGNOSIS — Z87440 Personal history of urinary (tract) infections: Secondary | ICD-10-CM | POA: Diagnosis not present

## 2022-06-22 DIAGNOSIS — N920 Excessive and frequent menstruation with regular cycle: Secondary | ICD-10-CM | POA: Insufficient documentation

## 2022-06-22 DIAGNOSIS — Z79899 Other long term (current) drug therapy: Secondary | ICD-10-CM | POA: Insufficient documentation

## 2022-06-22 LAB — CBC WITH DIFFERENTIAL (CANCER CENTER ONLY)
Abs Immature Granulocytes: 0.01 10*3/uL (ref 0.00–0.07)
Basophils Absolute: 0 10*3/uL (ref 0.0–0.1)
Basophils Relative: 1 %
Eosinophils Absolute: 0.1 10*3/uL (ref 0.0–0.5)
Eosinophils Relative: 2 %
HCT: 36.1 % (ref 36.0–46.0)
Hemoglobin: 12.1 g/dL (ref 12.0–15.0)
Immature Granulocytes: 0 %
Lymphocytes Relative: 42 %
Lymphs Abs: 2.3 10*3/uL (ref 0.7–4.0)
MCH: 31.3 pg (ref 26.0–34.0)
MCHC: 33.5 g/dL (ref 30.0–36.0)
MCV: 93.3 fL (ref 80.0–100.0)
Monocytes Absolute: 0.4 10*3/uL (ref 0.1–1.0)
Monocytes Relative: 7 %
Neutro Abs: 2.7 10*3/uL (ref 1.7–7.7)
Neutrophils Relative %: 48 %
Platelet Count: 324 10*3/uL (ref 150–400)
RBC: 3.87 MIL/uL (ref 3.87–5.11)
RDW: 13.1 % (ref 11.5–15.5)
WBC Count: 5.6 10*3/uL (ref 4.0–10.5)
nRBC: 0 % (ref 0.0–0.2)

## 2022-06-22 LAB — CMP (CANCER CENTER ONLY)
ALT: 8 U/L (ref 0–44)
AST: 13 U/L — ABNORMAL LOW (ref 15–41)
Albumin: 4 g/dL (ref 3.5–5.0)
Alkaline Phosphatase: 49 U/L (ref 38–126)
Anion gap: 5 (ref 5–15)
BUN: 11 mg/dL (ref 6–20)
CO2: 28 mmol/L (ref 22–32)
Calcium: 9.3 mg/dL (ref 8.9–10.3)
Chloride: 104 mmol/L (ref 98–111)
Creatinine: 0.71 mg/dL (ref 0.44–1.00)
GFR, Estimated: >60 mL/min (ref >60)
Glucose, Bld: 84 mg/dL (ref 70–99)
Potassium: 3.9 mmol/L (ref 3.5–5.1)
Sodium: 137 mmol/L (ref 135–145)
Total Bilirubin: 0.3 mg/dL (ref 0.3–1.2)
Total Protein: 7.4 g/dL (ref 6.5–8.1)

## 2022-06-22 LAB — FERRITIN: Ferritin: 12 ng/mL (ref 11–307)

## 2022-06-22 LAB — IRON AND IRON BINDING CAPACITY (CC-WL,HP ONLY)
Iron: 46 ug/dL (ref 28–170)
Saturation Ratios: 13 % (ref 10.4–31.8)
TIBC: 360 ug/dL (ref 250–450)
UIBC: 314 ug/dL (ref 148–442)

## 2022-06-24 ENCOUNTER — Encounter: Payer: Self-pay | Admitting: Physician Assistant

## 2022-06-24 ENCOUNTER — Telehealth: Payer: Self-pay | Admitting: Pharmacy Technician

## 2022-06-24 NOTE — Progress Notes (Signed)
[place] [phone]   [phone]  PROGRESS NOTE  Patient Care Team: Tawnya Crook, MD as PCP - General (Family Medicine)   Hematological/Oncological History 1) Labs from PCP, Dr. Coralie Common: -06/21/2021: WBC 5.5, Hgb 8.6 (L), MCV 77 (L), Plt 356K, Vitamin B12 674, Folate 11.1  2) 07/11/2021: Establish care with [facility] Hematology/Oncology  3) 07/21/2021: Received IV monoferric 1000 mg x 1 dose  4) 12/19/2021-01/16/2022: Received IV venofer 200 mg x 5 doses  CHIEF COMPLAINTS/PURPOSE OF CONSULTATION:  Iron deficiency anemia  HISTORY OF PRESENTING ILLNESS:  Paige Fritz 32 y.o. female returns for a follow up for iron deficiency anemia. She was last seen on 03/21/2022 and in the interim, she denies any changes to her health.   On exam today, Paige Fritz reports her energy levels and appetite are stable. She still has monthly menstrual bleeding last 3 days with 2 days of heavy bleeding with clots. She has a follow up with her PCP next week to discuss interventions including ablation. She has no other signs of bleeding or bruising. She denies any fevers, chills, night sweats, shortness of breath, chest pain, cough, nausea, vomiting, diarrhea or constipation.  She has no other complaints.  Rest of the 10 point ROS is below.  MEDICAL HISTORY:  Past Medical History:  Diagnosis Date   Anemia    fibroids.  iron infusion   Anxiety    Back pain    Headache(784.0)    UTI (urinary tract infection)     SURGICAL HISTORY: Past Surgical History:  Procedure Laterality Date   NO PAST SURGERIES      SOCIAL HISTORY: Social History   Socioeconomic History   Marital status: Single    Spouse name: Not on file   Number of children: 0   Years of education: Not on file   Highest education level: Not on file  Occupational History   Occupation: CNA, Occupational psychologist, Oceanographer, Ship broker   Occupation: front desk    Employer: [employer]   Tobacco Use   Smoking status: Never   Smokeless tobacco: Never  Vaping Use   Vaping Use: Never used  Substance and Sexual Activity   Alcohol use: Yes    Alcohol/week: 1.0 standard drink of alcohol    Types: 1 Glasses of wine per week    Comment: 1-2x/week   Drug use: No   Sexual activity: Yes    Birth control/protection: None  Other Topics Concern   Not on file  Social History Narrative   Not on file   Social Determinants of Health   Financial Resource Strain: Not on file  Food Insecurity: Not on file  Transportation Needs: Not on file  Physical Activity: Not on file  Stress: Not on file  Social Connections: Not on file  Intimate Partner Violence: Not on file    FAMILY HISTORY: Family History  Problem Relation Age of Onset   Obesity Mother    Hypertension Father     ALLERGIES:  has No Known Allergies.  MEDICATIONS:  Current Outpatient Medications  Medication Sig Dispense Refill   triamcinolone cream (KENALOG) 0.1 % Apply 1 Application topically 2 (two) times daily. 30 g 0   VITAMIN D PO Take 1 Capful by mouth daily.     Vitamin D, Ergocalciferol, (DRISDOL) 1.25 MG (50000 UNIT) CAPS capsule Take 1 capsule (50,000 Units total) by mouth every 7 (seven) days. (Patient not taking: Reported on 05/11/2022) 4 capsule 0   No current facility-administered  medications for this visit.    REVIEW OF SYSTEMS:   Constitutional: ( - ) fevers, ( - )  chills , ( - ) night sweats Eyes: ( - ) blurriness of vision, ( - ) double vision, ( - ) watery eyes Ears, nose, mouth, throat, and face: ( - ) mucositis, ( - ) sore throat Respiratory: ( - ) cough, ( -) dyspnea, ( - ) wheezes Cardiovascular: ( - ) palpitation, ( - ) chest discomfort, ( - ) lower extremity swelling Gastrointestinal:  ( - ) nausea, ( - ) heartburn, ( - ) change in bowel habits Skin: ( - ) abnormal skin rashes Lymphatics: ( - ) new lymphadenopathy, ( - ) easy bruising Neurological: ( - ) numbness, ( - ) tingling, ( -  ) new weaknesses Behavioral/Psych: ( - ) mood change, ( - ) new changes  All other systems were reviewed with the patient and are negative.  PHYSICAL EXAMINATION: Constitutional: Oriented to person, place, and time and well-developed, well-nourished, and in no distress.  HENT:  Head: Normocephalic and atraumatic.  Cardiovascular: Normal rate, regular rhythm, normal heart sounds   Pulmonary/Chest: Effort normal and breath sounds normal. No respiratory distress. No wheezes. No rales.  Musculoskeletal: Normal range of motion. Exhibits no edema.  Neurological: Alert and oriented to person, place, and time. Exhibits normal muscle tone. Gait normal. Coordination normal.  Skin: Skin is warm and dry. No rash noted. Not diaphoretic. No erythema. No pallor.  Psychiatric: Mood, memory and judgment normal.    LABORATORY DATA:  I have reviewed the data as listed    Latest Ref Rng & Units 06/22/2022    2:23 PM 03/16/2022    3:35 PM 12/05/2021    3:40 PM  CBC  WBC 4.0 - 10.5 K/uL 5.6  5.5  5.3   Hemoglobin 12.0 - 15.0 g/dL 12.1  12.8  11.6   Hematocrit 36.0 - 46.0 % 36.1  37.0  34.2   Platelets 150 - 400 K/uL 324  300  345        Latest Ref Rng & Units 06/22/2022    2:23 PM 03/16/2022    3:35 PM 12/05/2021    3:40 PM  CMP  Glucose 70 - 99 mg/dL 84  86  101   BUN 6 - 20 mg/dL 11  9  10   $ Creatinine 0.44 - 1.00 mg/dL 0.71  0.68  0.75   Sodium 135 - 145 mmol/L 137  135  136   Potassium 3.5 - 5.1 mmol/L 3.9  3.7  3.4   Chloride 98 - 111 mmol/L 104  103  105   CO2 22 - 32 mmol/L 28  27  25   $ Calcium 8.9 - 10.3 mg/dL 9.3  9.4  8.7   Total Protein 6.5 - 8.1 g/dL 7.4  8.1  7.8   Total Bilirubin 0.3 - 1.2 mg/dL 0.3  0.3  0.6   Alkaline Phos 38 - 126 U/L 49  48  48   AST 15 - 41 U/L 13  13  14   $ ALT 0 - 44 U/L 8  8  7     $ ASSESSMENT & PLAN Paige Fritz is a 32 y.o. female returns for a follow up for iron deficiency anemia.  #Iron deficiency anemia 2/2 heavy menstrual  bleeding --Unable to tolerate iron tablets due to constipation --Patient is under the care of OB/GYN, Dr. Maudry Diego, scheduled for follow up on 06/25/2022 to discuss interventions. Patient is not  interested in taking birth control.  --Last received IV venofer 200 mg x 5 doses from 12/19/2021-01/16/2022 --Labs from today shows no evidence of anemia with Hgb of 12.1. Iron levels have declined with ferritin 12, iron saturation 13% --Recommend IV venofer 200 mg x 3 doses to bolster iron levels. --RTC in 3 months to repeat labs.   No orders of the defined types were placed in this encounter.   All questions were answered. The patient knows to call the clinic with any problems, questions or concerns.  I have spent a total of 30 minutes minutes of face-to-face and non-face-to-face time, preparing to see the patient, performing a medically appropriate examination, counseling and educating the patient, ordering medications, documenting clinical information in the electronic health record,and care coordination.    Dede Query, PA-C Department of Hematology/Oncology [place] at [facility] Phone: [phone]

## 2022-06-24 NOTE — Telephone Encounter (Signed)
Dr. Charlies Silvers, Juluis Rainier notet:  Auth Submission: NO AUTH NEEDED Payer: Holland Falling Medication & CPT/J Code(s) submitted: Venofer (Iron Sucrose) J1756 Route of submission (phone, fax, portal):  Phone # Fax # Auth type: Buy/Bill Units/visits requested:  Reference number: 3 Approval from: 06/24/22 to 11/22/22   Patient will be scheduled as soon as possible

## 2022-06-25 ENCOUNTER — Telehealth: Payer: Self-pay | Admitting: Physician Assistant

## 2022-06-25 ENCOUNTER — Telehealth: Payer: Self-pay

## 2022-06-25 DIAGNOSIS — Z01419 Encounter for gynecological examination (general) (routine) without abnormal findings: Secondary | ICD-10-CM | POA: Diagnosis not present

## 2022-06-25 DIAGNOSIS — N870 Mild cervical dysplasia: Secondary | ICD-10-CM | POA: Diagnosis not present

## 2022-06-25 NOTE — Telephone Encounter (Signed)
Called patient to schedule 3 month fu per los. Patient advised she does not get off work until 4:15 on the weekdays. Patient said she will just call back to schedule a follow up appointment when she gets a chance.

## 2022-06-25 NOTE — Telephone Encounter (Signed)
Pt advised and is aware [facility] scheduling will call after insurance approval.

## 2022-06-25 NOTE — Telephone Encounter (Signed)
-----   Message from Lincoln Brigham, PA-C sent at 06/25/2022  9:00 AM EST ----- Please notify patient that her iron levels have decreased so we will arrange for IV iron to help improve her levels to prevent anemia.    ----- Message ----- From: Interface, Lab In [facility] Sent: 06/22/2022   2:54 PM EST To: Lincoln Brigham, PA-C

## 2022-06-28 ENCOUNTER — Encounter: Payer: Self-pay | Admitting: Physician Assistant

## 2022-06-28 ENCOUNTER — Ambulatory Visit (INDEPENDENT_AMBULATORY_CARE_PROVIDER_SITE_OTHER): Payer: 59 | Admitting: Family Medicine

## 2022-06-28 ENCOUNTER — Encounter (INDEPENDENT_AMBULATORY_CARE_PROVIDER_SITE_OTHER): Payer: Self-pay | Admitting: Family Medicine

## 2022-06-28 VITALS — BP 121/72 | HR 73 | Temp 98.6°F | Ht 61.0 in | Wt 208.0 lb

## 2022-06-28 DIAGNOSIS — E559 Vitamin D deficiency, unspecified: Secondary | ICD-10-CM | POA: Diagnosis not present

## 2022-06-28 DIAGNOSIS — E669 Obesity, unspecified: Secondary | ICD-10-CM | POA: Diagnosis not present

## 2022-06-28 DIAGNOSIS — Z6839 Body mass index (BMI) 39.0-39.9, adult: Secondary | ICD-10-CM

## 2022-06-28 DIAGNOSIS — E7849 Other hyperlipidemia: Secondary | ICD-10-CM | POA: Diagnosis not present

## 2022-06-28 MED ORDER — VITAMIN D (ERGOCALCIFEROL) 1.25 MG (50000 UNIT) PO CAPS: 50000.0000 [IU] | ORAL_CAPSULE | ORAL | 0 refills | Status: DC

## 2022-06-28 NOTE — Progress Notes (Deleted)
Patient is returning from her last appointment 1 year ago.  She has a new job and is working at [employer] now.  She has gained weight and is feeling somewhat down due to her weight gain.  She is eating meat again due to her low iron level.  Her gyn submitted for her to get an ablation to help with her heavy periods which will then, hopefully, improve anemia.  She has also been referred to Plastic Surgery for evaluation for a breast reduction due to significant back pain she is experiencing.

## 2022-07-05 NOTE — Progress Notes (Signed)
Chief Complaint:   OBESITY Paige Fritz is here to discuss her progress with her obesity treatment plan along with follow-up of her obesity related diagnoses. Paige Fritz is on the [place] and states she is following her eating plan approximately 0% of the time. Paige Fritz states she is exercising 0 minutes 0 times per week.  Today's visit was #: 4 Starting weight: 192 lbs Starting date: 06/21/2021 Today's weight: 208 lbs Today's date: 06/28/2022 Total lbs lost to date: 0 Total lbs lost since last in-office visit: 0  Interim History: Paige Fritz is returning from her last appointment 1 year ago.  She has a new job and is working at [employer] now.  She has gained weight and is feeling somewhat down due to her weight gain.  She is eating meat again due to her low iron level.  Her gyn submitted for her to get an ablation to help with her heavy periods which will then, hopefully, improve anemia.  She has also been referred to Plastic Surgery for evaluation for a breast reduction due to significant back pain she is experiencing.    Subjective:   1. Other hyperlipidemia LDL significantly elevated at 175.  HDL of 65.9 and Trigly of 75.  2. Vitamin D deficiency On over the counter currently.  Notes fatigue.  Assessment/Plan:   1. Other hyperlipidemia Will do lipoprotein a level at next appointment.  2. Vitamin D deficiency We will refill Vit D 50K IU once a week for 1 month with 0 refills.  Will obtain level at next appointment.  -Refill Vitamin D, Ergocalciferol, (DRISDOL) 1.25 MG (50000 UNIT) CAPS capsule; Take 1 capsule (50,000 Units total) by mouth every 7 (seven) days.  Dispense: 4 capsule; Refill: 0  3. Obesity with current BMI of 39.5 [name] is currently in the action stage of change. As such, her goal is to continue with weight loss efforts. She has agreed to the Category 2 Plan.   Exercise goals: All adults should avoid inactivity. Some physical activity  is better than none, and adults who participate in any amount of physical activity gain some health benefits.  Behavioral modification strategies: increasing lean protein intake, meal planning and cooking strategies, keeping healthy foods in the home, and planning for success.  Paige Fritz has agreed to follow-up with our clinic in 3 weeks. She was informed of the importance of frequent follow-up visits to maximize her success with intensive lifestyle modifications for her multiple health conditions.   Objective:   Blood pressure 121/72, pulse 73, temperature 98.6 F (37 C), height '5\' 1"'$  (1.549 m), weight 208 lb (94.3 kg), SpO2 100 %. Body mass index is 39.3 kg/m.  General: Cooperative, alert, well developed, in no acute distress. HEENT: Conjunctivae and lids unremarkable. Cardiovascular: Regular rhythm.  Lungs: Normal work of breathing. Neurologic: No focal deficits.   Lab Results  Component Value Date   CREATININE 0.71 06/22/2022   BUN 11 06/22/2022   NA 137 06/22/2022   K 3.9 06/22/2022   CL 104 06/22/2022   CO2 28 06/22/2022   Lab Results  Component Value Date   ALT 8 06/22/2022   AST 13 (L) 06/22/2022   ALKPHOS 49 06/22/2022   BILITOT 0.3 06/22/2022   Lab Results  Component Value Date   HGBA1C 5.4 06/21/2021   Lab Results  Component Value Date   INSULIN 10.7 06/21/2021   Lab Results  Component Value Date   TSH 3.390 06/21/2021   Lab Results  Component Value Date   CHOL 256 (H) 05/15/2022   HDL 65.90 05/15/2022   LDLCALC 175 (H) 05/15/2022   TRIG 75.0 05/15/2022   CHOLHDL 4 05/15/2022   Lab Results  Component Value Date   VD25OH 34.01 09/06/2021   VD25OH 5.9 (L) 06/21/2021   Lab Results  Component Value Date   WBC 5.6 06/22/2022   HGB 12.1 06/22/2022   HCT 36.1 06/22/2022   MCV 93.3 06/22/2022   PLT 324 06/22/2022   Lab Results  Component Value Date   IRON 46 06/22/2022   TIBC 360 06/22/2022   FERRITIN 12 06/22/2022   Attestation Statements:    Reviewed by clinician on day of visit: allergies, medications, problem list, medical history, surgical history, family history, social history, and previous encounter notes.  I, Elnora Morrison, RMA am acting as transcriptionist for Coralie Common, MD.  I have reviewed the above documentation for accuracy and completeness, and I agree with the above. - Coralie Common, MD

## 2022-07-24 ENCOUNTER — Encounter (INDEPENDENT_AMBULATORY_CARE_PROVIDER_SITE_OTHER): Payer: Self-pay | Admitting: Family Medicine

## 2022-07-24 ENCOUNTER — Ambulatory Visit (INDEPENDENT_AMBULATORY_CARE_PROVIDER_SITE_OTHER): Payer: 59 | Admitting: Family Medicine

## 2022-07-24 VITALS — BP 111/62 | HR 62 | Temp 98.6°F | Ht 61.0 in | Wt 208.0 lb

## 2022-07-24 DIAGNOSIS — E88819 Insulin resistance, unspecified: Secondary | ICD-10-CM

## 2022-07-24 DIAGNOSIS — E669 Obesity, unspecified: Secondary | ICD-10-CM | POA: Diagnosis not present

## 2022-07-24 DIAGNOSIS — R0602 Shortness of breath: Secondary | ICD-10-CM

## 2022-07-24 DIAGNOSIS — Z9189 Other specified personal risk factors, not elsewhere classified: Secondary | ICD-10-CM

## 2022-07-24 DIAGNOSIS — Z6839 Body mass index (BMI) 39.0-39.9, adult: Secondary | ICD-10-CM | POA: Diagnosis not present

## 2022-07-24 DIAGNOSIS — E7849 Other hyperlipidemia: Secondary | ICD-10-CM | POA: Diagnosis not present

## 2022-07-24 DIAGNOSIS — E559 Vitamin D deficiency, unspecified: Secondary | ICD-10-CM | POA: Diagnosis not present

## 2022-07-24 MED ORDER — SIMVASTATIN 20 MG PO TABS: 20.0000 mg | ORAL_TABLET | Freq: Every day | ORAL | 0 refills | Status: DC

## 2022-07-24 MED ORDER — VITAMIN D (ERGOCALCIFEROL) 1.25 MG (50000 UNIT) PO CAPS: 50000.0000 [IU] | ORAL_CAPSULE | ORAL | 0 refills | Status: DC

## 2022-07-24 NOTE — Progress Notes (Signed)
Chief Complaint:   OBESITY Paige Fritz is here to discuss her progress with her obesity treatment plan along with follow-up of her obesity related diagnoses. Paige Fritz is on the Category 2 Plan and states she is following her eating plan approximately 80% of the time. Paige Fritz states she is exercising.  Today's visit was #: 5 Starting weight: 192 LBS Starting date: 06/21/2021 Today's weight: 208 LBS Today's date: 07/24/2022 Total lbs lost to date: 0 Total lbs lost since last in-office visit: 0  Interim History: Patient returns for IC today and has been still doing intermittent fasting.  She is now doing apple cider vinegar, honey and warm water in the am.  Lunch yesterday was [place] tacos and dinner was a 2 egg sandwich.  Makes her tacos with lots of meat.  Next few weeks she does not think she has any appointments or events. Has a breast reduction consultation on April 9th.  Subjective:   1. SOBOE (shortness of breath on exertion) Initial RMR on 06/21/2021 was 1541.  Symptoms pain at that time and today.  2. Insulin resistance Patient has some carb intake, but her biggest cravings for black beans.  3. Vitamin D deficiency Patient is on prescription vitamin D.  Last level May 2023.  4. Other hyperlipidemia Patient last LDL 175, HDL 65.9, triglycerides 75.  Patient is not on any medications.  5. At risk for activity intolerance Paige Fritz was given approximately 8 minutes of exercise intolerance counseling today. She is 32 y.o. female and has risk factors exercise intolerance including obesity. We discussed intensive lifestyle modifications today with an emphasis on specific weight loss instructions and strategies. Paige Fritz will slowly increase activity as tolerated.  Repetitive spaced learning was employed today to elicit superior memory formation and behavioral change. Patient has significant interference due to breast size.  Having back pain currently.  Assessment/Plan:   1.  SOBOE (shortness of breath on exertion) Check IC today, it was 1642.  We will increase to category 2+100 cal.  2. Insulin resistance Check A1c and insulin level today. - Hemoglobin A1c - Insulin, random  3. Vitamin D deficiency Check labs today.  - VITAMIN D 25 Hydroxy (Vit-D Deficiency, Fractures)  Refill- Vitamin D, Ergocalciferol, (DRISDOL) 1.25 MG (50000 UNIT) CAPS capsule; Take 1 capsule (50,000 Units total) by mouth every 7 (seven) days.  Dispense: 4 capsule; Refill: 0  4. Other hyperlipidemia Start- simvastatin (ZOCOR) 20 MG tablet; Take 1 tablet (20 mg total) by mouth at bedtime.  Dispense: 30 tablet; Refill: 0  5. At risk for activity intolerance Patient encouraged to start incorporating core exercises with no weight.  Patient has an appointment with plastics on 4/9.  6. Obesity with current BMI of 39.4 Paige Fritz is currently in the action stage of change. As such, her goal is to continue with weight loss efforts. She has agreed to the Category 2 Plan +100-calorie.   Exercise goals: No exercise has been prescribed at this time.  Behavioral modification strategies: increasing lean protein intake, meal planning and cooking strategies, keeping healthy foods in the home, and planning for success.  Paige Fritz has agreed to follow-up with our clinic in 3 weeks. She was informed of the importance of frequent follow-up visits to maximize her success with intensive lifestyle modifications for her multiple health conditions.   Objective:   Blood pressure 111/62, pulse 62, temperature 98.6 F (37 C), height 5\' 1"  (1.549 m), weight 208 lb (94.3 kg), SpO2 98 %. Body mass index is 39.3 kg/m.  General: Cooperative, alert, well developed, in no acute distress. HEENT: Conjunctivae and lids unremarkable. Cardiovascular: Regular rhythm.  Lungs: Normal work of breathing. Neurologic: No focal deficits.   Lab Results  Component Value Date   CREATININE 0.71 06/22/2022   BUN 11 06/22/2022    NA 137 06/22/2022   K 3.9 06/22/2022   CL 104 06/22/2022   CO2 28 06/22/2022   Lab Results  Component Value Date   ALT 8 06/22/2022   AST 13 (L) 06/22/2022   ALKPHOS 49 06/22/2022   BILITOT 0.3 06/22/2022   Lab Results  Component Value Date   HGBA1C 5.3 07/24/2022   HGBA1C 5.4 06/21/2021   Lab Results  Component Value Date   INSULIN 12.0 07/24/2022   INSULIN 10.7 06/21/2021   Lab Results  Component Value Date   TSH 3.390 06/21/2021   Lab Results  Component Value Date   CHOL 256 (H) 05/15/2022   HDL 65.90 05/15/2022   LDLCALC 175 (H) 05/15/2022   TRIG 75.0 05/15/2022   CHOLHDL 4 05/15/2022   Lab Results  Component Value Date   VD25OH 20.5 (L) 07/24/2022   VD25OH 34.01 09/06/2021   VD25OH 5.9 (L) 06/21/2021   Lab Results  Component Value Date   WBC 5.6 06/22/2022   HGB 12.1 06/22/2022   HCT 36.1 06/22/2022   MCV 93.3 06/22/2022   PLT 324 06/22/2022   Lab Results  Component Value Date   IRON 46 06/22/2022   TIBC 360 06/22/2022   FERRITIN 12 06/22/2022   Attestation Statements:   Reviewed by clinician on day of visit: allergies, medications, problem list, medical history, surgical history, family history, social history, and previous encounter notes.  I, Davy Pique, RMA, am acting as transcriptionist for Coralie Common, MD.  I have reviewed the above documentation for accuracy and completeness, and I agree with the above. - Coralie Common, MD

## 2022-07-25 LAB — HEMOGLOBIN A1C
Est. average glucose Bld gHb Est-mCnc: 105 mg/dL
Hgb A1c MFr Bld: 5.3 % (ref 4.8–5.6)

## 2022-07-25 LAB — INSULIN, RANDOM: INSULIN: 12 u[IU]/mL (ref 2.6–24.9)

## 2022-07-25 LAB — VITAMIN D 25 HYDROXY (VIT D DEFICIENCY, FRACTURES): Vit D, 25-Hydroxy: 20.5 ng/mL — ABNORMAL LOW (ref 30.0–100.0)

## 2022-08-14 ENCOUNTER — Encounter: Payer: Self-pay | Admitting: Plastic Surgery

## 2022-08-14 ENCOUNTER — Ambulatory Visit: Payer: 59 | Admitting: Plastic Surgery

## 2022-08-14 VITALS — BP 144/88 | HR 96 | Ht 61.5 in | Wt 213.6 lb

## 2022-08-14 DIAGNOSIS — N62 Hypertrophy of breast: Secondary | ICD-10-CM | POA: Insufficient documentation

## 2022-08-14 DIAGNOSIS — M542 Cervicalgia: Secondary | ICD-10-CM

## 2022-08-14 DIAGNOSIS — Z6841 Body Mass Index (BMI) 40.0 and over, adult: Secondary | ICD-10-CM | POA: Diagnosis not present

## 2022-08-14 DIAGNOSIS — R21 Rash and other nonspecific skin eruption: Secondary | ICD-10-CM

## 2022-08-14 DIAGNOSIS — M546 Pain in thoracic spine: Secondary | ICD-10-CM | POA: Diagnosis not present

## 2022-08-14 DIAGNOSIS — M545 Low back pain, unspecified: Secondary | ICD-10-CM

## 2022-08-14 DIAGNOSIS — M549 Dorsalgia, unspecified: Secondary | ICD-10-CM | POA: Insufficient documentation

## 2022-08-14 DIAGNOSIS — G8929 Other chronic pain: Secondary | ICD-10-CM

## 2022-08-14 NOTE — Progress Notes (Signed)
Patient ID: Paige Fritz, female    DOB: 1991-03-09, 32 y.o.   MRN: [id number]   Chief Complaint  Patient presents with   Breast Problem    Mammary Hyperplasia: The patient is a 32 y.o. female with a history of mammary hyperplasia for several years.  She has extremely large breasts causing symptoms that include the following: Back pain in the upper and lower back, including neck pain. She pulls or pins her bra straps to provide better lift and relief of the pressure and pain. She notices relief by holding her breast up manually.  Her shoulder straps cause grooves and pain and pressure that requires padding for relief. Pain medication is sometimes required with motrin and tylenol.  Activities that are hindered by enlarged breasts include: exercise and running.  She has tried supportive clothing as well as fitted bras without improvement.  Her breasts are extremely large and fairly symmetric.  She has hyperpigmentation of the inframammary area on both sides.  The sternal to nipple distance on the right is 31 cm and the left is 33 cm.  The IMF distance is 23 cm.  She is 5 feet 1 inches tall and weighs 213 pounds.  The BMI = 40.2 kg/m.  Preoperative bra size = J cup.  She would like to be anything smaller. The estimated excess breast tissue to be removed at the time of surgery = 550 grams on the left and 550 grams on the right.  Mammogram history: none.  Family history of breast cancer:  none.  Tobacco use:  none.   The patient expresses the desire to pursue surgical intervention.  She has some fibroids that may be lasered in the near future and iron deficiency.  She sees a hematologist for the iron deficiency and would need to see them prior to our surgery as well.  She has not done physical therapy but she is willing to do that.     Review of Systems  Constitutional: Negative.   HENT: Negative.    Eyes: Negative.   Respiratory: Negative.  Negative for chest tightness and shortness of  breath.   Cardiovascular: Negative.   Gastrointestinal: Negative.   Endocrine: Negative.   Genitourinary: Negative.   Musculoskeletal:  Positive for back pain and neck pain.  Skin:  Positive for rash.    Past Medical History:  Diagnosis Date   Anemia    fibroids.  iron infusion   Anxiety    Back pain    Headache(784.0)    UTI (urinary tract infection)     Past Surgical History:  Procedure Laterality Date   NO PAST SURGERIES        Current Outpatient Medications:    simvastatin (ZOCOR) 20 MG tablet, Take 1 tablet (20 mg total) by mouth at bedtime., Disp: 30 tablet, Rfl: 0   triamcinolone cream (KENALOG) 0.1 %, Apply 1 Application topically 2 (two) times daily., Disp: 30 g, Rfl: 0   Vitamin D, Ergocalciferol, (DRISDOL) 1.25 MG (50000 UNIT) CAPS capsule, Take 1 capsule (50,000 Units total) by mouth every 7 (seven) days., Disp: 4 capsule, Rfl: 0   Objective:   Vitals:   08/14/22 1509  BP: (!) 144/88  Pulse: 96  SpO2: 99%    Physical Exam Vitals and nursing note reviewed.  Constitutional:      Appearance: Normal appearance.  HENT:     Head: Normocephalic and atraumatic.  Cardiovascular:     Rate and Rhythm: Normal rate.  Pulses: Normal pulses.  Pulmonary:     Effort: Pulmonary effort is normal.  Abdominal:     Palpations: Abdomen is soft.  Musculoskeletal:        General: No swelling or deformity.  Skin:    General: Skin is warm.     Capillary Refill: Capillary refill takes less than 2 seconds.     Coloration: Skin is not jaundiced.  Neurological:     Mental Status: She is alert and oriented to person, place, and time.  Psychiatric:        Mood and Affect: Mood normal.        Behavior: Behavior normal.        Thought Content: Thought content normal.        Judgment: Judgment normal.     Assessment & Plan:  Symptomatic mammary hypertrophy - Plan: Ambulatory referral to Physical Therapy  Chronic bilateral thoracic back pain - Plan: Ambulatory referral  to Physical Therapy  The procedure the patient selected and that was best for the patient was discussed. The risk were discussed and include but not limited to the following:  Breast asymmetry, fluid accumulation, firmness of the breast, inability to breast feed, loss of nipple or areola, skin loss, change in skin and nipple sensation, fat necrosis of the breast tissue, bleeding, infection and healing delay.  There are risks of anesthesia and injury to nerves or blood vessels.  Allergic reaction to tape, suture and skin glue are possible.  There will be swelling.  Any of these can lead to the need for revisional surgery which is not included in this surgery.  A breast reduction has potential to interfere with diagnostic procedures in the future.  This procedure is best done when the breast is fully developed.  Changes in the breast will continue to occur over time: pregnancy, weight gain or weigh loss. No guarantees are given for a certain bra or breast size.    Total time: 40 minutes. This includes time spent with the patient during the visit as well as time spent before and after the visit reviewing the chart, documenting the encounter, ordering pertinent studies and literature for the patient.   Physical therapy:  ordered Mammogram:  not indicated Healthy Weight and Wellness:  currently going  The patient is a good candidate for bilateral breast reduction.  She does need to decrease her weight and increase her protein.  She will need to do physical therapy.  She will need to see her hematologist prior to any surgery because of her iron deficiency.  Will plan to see her back in about 2-1/2 months.  Pictures were obtained of the patient and placed in the chart with the patient's or guardian's permission.   Alena Bills Macen Joslin, DO

## 2022-08-15 ENCOUNTER — Ambulatory Visit (INDEPENDENT_AMBULATORY_CARE_PROVIDER_SITE_OTHER): Payer: 59 | Admitting: Family Medicine

## 2022-08-15 VITALS — BP 120/78 | HR 98 | Temp 98.4°F | Ht 61.5 in | Wt 214.0 lb

## 2022-08-15 DIAGNOSIS — R1030 Lower abdominal pain, unspecified: Secondary | ICD-10-CM

## 2022-08-15 DIAGNOSIS — R8290 Unspecified abnormal findings in urine: Secondary | ICD-10-CM | POA: Diagnosis not present

## 2022-08-15 DIAGNOSIS — K5900 Constipation, unspecified: Secondary | ICD-10-CM

## 2022-08-15 LAB — POCT URINALYSIS DIPSTICK OB
Bilirubin, UA: NEGATIVE
Blood, UA: NEGATIVE
Glucose, UA: NEGATIVE
Ketones, UA: NEGATIVE
Leukocytes, UA: NEGATIVE
Nitrite, UA: NEGATIVE
POC,PROTEIN,UA: NEGATIVE
Spec Grav, UA: >=1.03 — AB (ref 1.010–1.025)
Urobilinogen, UA: 0.2 E.U./dL
pH, UA: 6 (ref 5.0–8.0)

## 2022-08-15 NOTE — Patient Instructions (Addendum)
-  Urinalysis showed an increase spec gravity, more likely from dehydration. Recommend to increase fluid intake, 64-100oz of water. Will send urine for culture for confirmation.  -Suspect this pain is from constipation and change in diet. Recommend to take a capful of Miralax daily for 3 days to see if you can have a bowel movement.  -If no improvement, call the office or send a message through MyChart, will order labs and imaging.  -If you develop more severe pain, fever, nausea, vomiting, or blood in stool, call the office for further treatment.   -Recommend to take a daily stool softer to help with constipation.

## 2022-08-15 NOTE — Progress Notes (Signed)
Acute Office Visit   Subjective:  Patient ID: Paige Fritz, female    DOB: June 08, 1990, 32 y.o.   MRN: [id number]  Chief Complaint  Patient presents with   Abdominal Pain    Pt reports she has lower abdominal pain. Sx started 4 am 4/10. Pt reports she has pressure.    Abdominal Pain  Patient is a 32 year old [place] American female that presents with lower abd pain since 4 am this morning. She describes the pain as constant pressure that started all of sudden and woke her up out of her sleep. She reports the left side is worse and denies no radiating pain. Denies urinary symptoms. Yesterday, she reports she ate at [facility]. The sausage tasted different, but unsure if it is their usual taste of sausage or different since this is the first time she has ate their sausage. Also, the food was greasy and she has been trying to eat more healthy options. Last bowel movement was yesterday at 5 pm with her normal color, amount, and consistency. She reports she usually has a hard time having a bowel movement and does not occur daily.   Denies nausea, vomiting, fever, vaginal discharge or pain.  She reports she feels if she vomits it would make her feel better.  She has tried a heating pad that has helped some. Also, passing gas and burping has increased, with some improvement with symptoms. Review of Systems  Gastrointestinal:  Positive for abdominal pain.   See HPI above      Objective:   BP 120/78   Pulse 98   Temp 98.4 F (36.9 C)   Ht 5' 1.5" (1.562 m)   Wt 214 lb (97.1 kg)   SpO2 98%   BMI 39.78 kg/m    Physical Exam Vitals reviewed.  Constitutional:      General: She is not in acute distress.    Appearance: Normal appearance. She is well-developed. She is not ill-appearing, toxic-appearing or diaphoretic.  HENT:     Head: Normocephalic and atraumatic.  Eyes:     General:        Right eye: No discharge.        Left eye: No discharge.     Conjunctiva/sclera:  Conjunctivae normal.  Cardiovascular:     Rate and Rhythm: Normal rate and regular rhythm.     Heart sounds: Normal heart sounds. No murmur heard.    No friction rub. No gallop.  Pulmonary:     Effort: Pulmonary effort is normal. No respiratory distress.     Breath sounds: Normal breath sounds.  Abdominal:     General: Bowel sounds are normal. There is no distension.     Palpations: Abdomen is soft.     Tenderness: There is abdominal tenderness in the suprapubic area and left lower quadrant. There is no guarding or rebound.  Musculoskeletal:        General: Normal range of motion.  Skin:    General: Skin is warm and dry.  Neurological:     General: No focal deficit present.     Mental Status: She is alert and oriented to person, place, and time. Mental status is at baseline.  Psychiatric:        Mood and Affect: Mood normal.        Behavior: Behavior normal.        Thought Content: Thought content normal.        Judgment: Judgment normal.    Results for  orders placed or performed in visit on 08/15/22  POC Urinalysis Dipstick OB  Result Value Ref Range   Color, UA DARK    Clarity, UA     Glucose, UA Negative Negative   Bilirubin, UA NEG    Ketones, UA NEG    Spec Grav, UA >=1.030 (A) 1.010 - 1.025   Blood, UA NEG    pH, UA 6.0 5.0 - 8.0   POC,PROTEIN,UA Negative Negative, Trace, Small (1+), Moderate (2+), Large (3+), 4+   Urobilinogen, UA 0.2 0.2 or 1.0 E.U./dL   Nitrite, UA NEG    Leukocytes, UA Negative Negative   Appearance Dark    Odor        Assessment & Plan:  Constipation, unspecified constipation type  Lower abdominal pain -     POC Urinalysis Dipstick OB  Abnormal urinalysis -     Urine Culture  -Urinalysis showed an increase in spec gravity, more likely from dehydration. Recommend to increase fluid intake, 64-100oz of water. Will send urine for a culture to confirm. -Suspect this pain is from constipation and change in diet. Heating pad, passing gas,  and burping have helped some. Recommend to take a capful of Miralax daily for 3 days to see if she can have a bowel movement. Also, provided written information about constipation.  -If no improvement, call the office or send a message through MyChart, will order labs (CBC w/diff and CMP) and imaging.  -If you develop more severe pain, fever, nausea, vomiting, or blood in stool, call the office for further treatment.   -Recommend to take a daily stool softer to help with constipation and to help with a regular bowel movement.  -Patient expressed understanding and is in agreement with plan.   No follow-ups on file.  Zandra Abts, NP

## 2022-08-16 LAB — URINE CULTURE
MICRO NUMBER:: 14806952
Result:: NO GROWTH
SPECIMEN QUALITY:: ADEQUATE

## 2022-08-21 ENCOUNTER — Encounter (INDEPENDENT_AMBULATORY_CARE_PROVIDER_SITE_OTHER): Payer: Self-pay | Admitting: Family Medicine

## 2022-08-21 ENCOUNTER — Ambulatory Visit (INDEPENDENT_AMBULATORY_CARE_PROVIDER_SITE_OTHER): Payer: 59 | Admitting: Family Medicine

## 2022-08-21 VITALS — BP 114/62 | HR 93 | Temp 98.6°F | Ht 61.0 in | Wt 210.0 lb

## 2022-08-21 DIAGNOSIS — E669 Obesity, unspecified: Secondary | ICD-10-CM | POA: Diagnosis not present

## 2022-08-21 DIAGNOSIS — E559 Vitamin D deficiency, unspecified: Secondary | ICD-10-CM | POA: Diagnosis not present

## 2022-08-21 DIAGNOSIS — E7849 Other hyperlipidemia: Secondary | ICD-10-CM

## 2022-08-21 DIAGNOSIS — Z6839 Body mass index (BMI) 39.0-39.9, adult: Secondary | ICD-10-CM

## 2022-08-21 MED ORDER — VITAMIN D (ERGOCALCIFEROL) 1.25 MG (50000 UNIT) PO CAPS: 50000.0000 [IU] | ORAL_CAPSULE | ORAL | 0 refills | Status: DC

## 2022-08-21 MED ORDER — SIMVASTATIN 20 MG PO TABS: 20.0000 mg | ORAL_TABLET | Freq: Every day | ORAL | 0 refills | Status: DC

## 2022-08-21 NOTE — Progress Notes (Signed)
Chief Complaint:   OBESITY Paige Fritz is here to discuss her progress with her obesity treatment plan along with follow-up of her obesity related diagnoses. Paige Fritz is on the Category 2 Plan +100 cal and states she is following her eating plan approximately 80% of the time. Paige Fritz states she is some walking.  Today's visit was #: 6 Starting weight: 192 LBS Starting date: 06/21/2021 Today's weight: 210 LBS Today's date: 08/21/2022 Total lbs lost to date: 0 Total lbs lost since last in-office visit: +2 LBS  Interim History: Patient saw Dr. Ulice Bold for breast reduction evaluation and is scheduled to start PT next week.  Foodwise she brought her MyFitnessPal journal today. She is incorporating more protein in her diet and dealing with some constipation.  She is averaging around 1150 for calories and getting between 80-95 grams of protein per day.  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    Subjective:   1. Vitamin D deficiency Patient is on prescription vitamin D.  Patient is positive for fatigue.  2. Other hyperlipidemia Patient is on Zocor 20 mg daily.  No transaminitis or myalgias.  Assessment/Plan:   1. Vitamin D deficiency Refill- Vitamin D, Ergocalciferol, (DRISDOL) 1.25 MG (50000 UNIT) CAPS capsule; Take 1 capsule (50,000 Units total) by mouth every 7 (seven) days.  Dispense: 4 capsule; Refill: 0  2. Other hyperlipidemia Refill- simvastatin (ZOCOR) 20 MG tablet; Take 1 tablet (20 mg total) by mouth at bedtime.  Dispense: 90 tablet; Refill: 0  3. Obesity with current BMI of 39.7 Paige Fritz is currently in the action stage of change. As such, her goal is to continue with weight loss efforts. She has agreed to the Category 2 Plan +100 cal.   Exercise goals: All adults should avoid inactivity. Some physical activity is better than none, and adults who participate in any amount of physical activity gain some health  benefits.  Behavioral modification strategies: increasing lean protein intake, meal planning and cooking strategies, keeping healthy foods in the home, and planning for success.  Paige Fritz has agreed to follow-up with our clinic in 3 weeks. She was informed of the importance of frequent follow-up visits to maximize her success with intensive lifestyle modifications for her multiple health conditions.   Objective:   Blood pressure 114/62, pulse 93, temperature 98.6 F (37 C), height  (1.549 m), weight 210 lb (95.3 kg), SpO2 98 %. Body mass index is 39.68 kg/m.  General: Cooperative, alert, well developed, in no acute distress. HEENT: Conjunctivae and lids unremarkable. Cardiovascular: Regular rhythm.  Lungs: Normal work of breathing. Neurologic: No focal deficits.   Lab Results  Component Value Date   CREATININE 0.71 06/22/2022   BUN 11 06/22/2022   NA 137 06/22/2022   K 3.9 06/22/2022   CL 104 06/22/2022   CO2 28 06/22/2022   Lab Results  Component Value Date   ALT 8 06/22/2022   AST 13 (L) 06/22/2022   ALKPHOS 49 06/22/2022   BILITOT 0.3 06/22/2022   Lab Results  Component Value Date   HGBA1C 5.3 07/24/2022   HGBA1C 5.4 06/21/2021   Lab Results  Component Value Date   INSULIN 12.0 07/24/2022   INSULIN 10.7 06/21/2021   Lab Results  Component Value Date   TSH 3.390 06/21/2021   Lab Results  Component Value Date   CHOL 256 (H) 05/15/2022   HDL 65.90 05/15/2022   LDLCALC 175 (H)  05/15/2022   TRIG 75.0 05/15/2022   CHOLHDL 4 05/15/2022   Lab Results  Component Value Date   VD25OH 20.5 (L) 07/24/2022   VD25OH 34.01 09/06/2021   VD25OH 5.9 (L) 06/21/2021   Lab Results  Component Value Date   WBC 5.6 06/22/2022   HGB 12.1 06/22/2022   HCT 36.1 06/22/2022   MCV 93.3 06/22/2022   PLT 324 06/22/2022   Lab Results  Component Value Date   IRON 46 06/22/2022   TIBC 360 06/22/2022   FERRITIN 12 06/22/2022   Attestation Statements:   Reviewed by  clinician on day of visit: allergies, medications, problem list, medical history, surgical history, family history, social history, and previous encounter notes.  I, Malcolm Metro, RMA, am acting as transcriptionist for Reuben Likes, MD.  I have reviewed the above documentation for accuracy and completeness, and I agree with the above. - Reuben Likes, MD

## 2022-08-24 NOTE — Therapy (Signed)
OUTPATIENT PHYSICAL THERAPY CERVICOTHORACIC EVALUATION   Patient Name: Paige Fritz MRN: [id number] DOB:Nov 15, 1990, 32 y.o., female Today's Date: 08/28/2022  END OF SESSION:  PT End of Session - 08/28/22 1631     Visit Number 1    Number of Visits 7    Date for PT Re-Evaluation 10/09/22    Authorization Type cone    PT Start Time 1632    PT Stop Time 1717    PT Time Calculation (min) 45 min    Activity Tolerance Patient tolerated treatment well;No increased pain    Behavior During Therapy WFL for tasks assessed/performed             Past Medical History:  Diagnosis Date   Anemia    fibroids.  iron infusion   Anxiety    Back pain    Headache(784.0)    UTI (urinary tract infection)    Past Surgical History:  Procedure Laterality Date   NO PAST SURGERIES     Patient Active Problem List   Diagnosis Date Noted   Symptomatic mammary hypertrophy 08/14/2022   Back pain 08/14/2022   Microcytic anemia 07/11/2021   Iron deficiency anemia 07/11/2021   Iron deficiency anemia due to chronic blood loss 07/11/2021    PCP: Jeani Sow, MD  REFERRING PROVIDER: Peggye Form, DO  REFERRING DIAG: N62 (ICD-10-CM) - Symptomatic mammary hypertrophy M54.6,G89.29 (ICD-10-CM) - Chronic bilateral thoracic back pain  THERAPY DIAG:  Pain in thoracic spine  Other low back pain  Abnormal posture  Muscle weakness (generalized)  Rationale for Evaluation and Treatment: Rehabilitation  ONSET DATE: several years ago   SUBJECTIVE:                                                                                                                                                                                                         SUBJECTIVE STATEMENT: Longstanding history of pain over past several years, gradual onset. Started with low back pain, then moved up to back/shoulders and neck. Reports some headaches w/ increased neck pain, 1-3x/week. No N/T. Difficulty  lifting >15-20# on average. Sitting tolerance <28min Hand dominance: Right  PERTINENT HISTORY:  anxiety  PAIN:  Are you having pain: 6-7/10  Location/description: low-mid back, up to neck Best-worst over past week: unable to assess  - aggravating factors: lifting, getting out of bed - Easing factors: positioning, heating pad, peppermint oil   PRECAUTIONS: None  WEIGHT BEARING RESTRICTIONS: No  FALLS:  Has patient fallen in last 6 months? No  LIVING ENVIRONMENT: Apartment - ~20 steps 1 rails,  lives w/ girlfriend. 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  PLOF: Independent  PATIENT GOALS: get surgery, be more active post surgery, feel more confident with exercise  NEXT MD VISIT: June 11th  OBJECTIVE:   DIAGNOSTIC FINDINGS:  No recent imaging   PATIENT SURVEYS:  FOTO 34 current, 54 predicted  COGNITION: Overall cognitive status: Within functional limits for tasks assessed  SENSATION: Mildly diminished LT RUE compared to L, but intact   POSTURE: significant forward head/rounded shoulders, increased lordosis  PALPATION: deferred   CERVICAL ROM:   Active ROM A/PROM (deg) eval  Flexion   Extension   Right lateral flexion   Left lateral flexion   Right rotation   Left rotation    (Blank rows = not tested) Comments: cervical ROM WFL and painless  Lumbar rotation painless and grossly symmetrical  Lumbar flexion concordant, able to reach mid shin  UPPER EXTREMITY ROM:  Active ROM Right eval Left eval  Shoulder flexion    Shoulder abduction    Shoulder internal rotation    Shoulder external rotation    Elbow flexion    Elbow extension    Wrist flexion    Wrist extension     (Blank rows = not tested) Comments:Mild end range shoulder/back pain with flexion/abd bilat but grossly WFL   UPPER EXTREMITY MMT:  MMT Right eval Left eval  Shoulder  flexion 3+ 3+  Shoulder extension    Shoulder abduction 3+ 3+  Shoulder extension    Shoulder internal rotation 4+ 4  Shoulder external rotation 4+ 4+  Elbow flexion    Elbow extension    Grip strength    (Blank rows = not tested)  (Key: WFL = within functional limits not formally assessed, * = concordant pain, s = stiffness/stretching sensation, NT = not tested)  Comments:    FUNCTIONAL TESTS:  5 times sit to stand: 11.26 sec gentle UE support no pain  TODAY'S TREATMENT:                                                                                                                              [facility] Adult PT Treatment:                                                DATE: 08/28/22 Therapeutic Exercise: Scapular retraction x10 cues for posture Seated chin tuck x10 cues for form HEP handout + education  PATIENT EDUCATION:  Education details: Pt education on PT impairments, prognosis, and POC. Informed consent. Rationale for interventions, safe/appropriate HEP performance Person educated: Patient Education method: Explanation, Demonstration, Tactile cues, Verbal cues, and Handouts Education comprehension: verbalized understanding, returned demonstration, verbal cues required, tactile cues required, and needs further education  HOME EXERCISE PROGRAM: Access Code: [email or web address] URL: [email or web address] Date: 08/28/2022 Prepared by: Fransisco Hertz  Exercises - Seated Scapular Retraction  - 1 x daily - 7 x weekly - 3 sets - 10 reps - Seated Cervical Retraction  - 1 x daily - 7 x weekly - 3 sets - 10 reps  ASSESSMENT:  CLINICAL IMPRESSION: Pt is a 32 year old woman who arrives to PT evaluation on this date for back pain in setting of mammary hypertrophy per referral. Pt reports difficulty with daily activities, lifting, and prolonged positioning due to pain. During today's session pt demonstrates functional but painful GH ROM, concordant pain with lumbar flexion, and  generalized postural weakness. which are limiting ability to perform aforementioned activities.  Recommend skilled PT to address aforementioned deficits to improve functional independence/tolerance. Pt departs today's session in no acute distress, all voiced questions/concerns addressed appropriately from PT perspective.    OBJECTIVE IMPAIRMENTS: decreased activity tolerance, decreased endurance, decreased mobility, decreased ROM, decreased strength, impaired perceived functional ability, improper body mechanics, postural dysfunction, and pain.   ACTIVITY LIMITATIONS: carrying, lifting, bending, sitting, standing, sleeping, transfers, bed mobility, and locomotion level  PARTICIPATION LIMITATIONS: meal prep, cleaning, laundry, driving, community activity, and occupation  PERSONAL FACTORS: Time since onset of injury/illness/exacerbation and 1 comorbidity: anxiety  are also affecting patient's functional outcome.   REHAB POTENTIAL: Good  CLINICAL DECISION MAKING: Stable/uncomplicated  EVALUATION COMPLEXITY: Low   GOALS: Goals reviewed with patient? No  SHORT TERM GOALS: Target date: 09/18/2022 Pt will demonstrate appropriate understanding and performance of initially prescribed HEP in order to facilitate improved independence with management of symptoms.  Baseline: HEP provided on eval Goal status: INITIAL   2. Pt will score greater than or equal to 45 on FOTO in order to demonstrate improved perception of function due to symptoms.  Baseline: 34  Goal status: INITIAL    LONG TERM GOALS: Target date: 10/09/2022 Pt will score 54 on FOTO in order to demonstrate improved perception of function due to symptoms. Baseline: 34 Goal status: INITIAL  2. Pt will report/demonstrate ability to sit for >1hr with less than 2pt increase in pain on NPS in order to improve tolerance to work activities.  Baseline: inc pain <81min sitting Goal status: INITIAL  3. Pt will demonstrate at least 4+/5  shoulder flexion/abduction MMT for improved symmetry of UE strength and improved tolerance to functional movements.  Baseline: see MMT chart above Goal status: INITIAL   4. Pt will report/demonstrate ability to lift up to 20# with less than 2 point increase on NPS in order to demonstrate improved tolerance to housework/community activities. Baseline: reports increased pain/difficulty lifting >15-20# Goal status: INITIAL   5. Pt will report reduction in neck pain-related headaches to no more than 1x/week in order to improve tolerance to functional activities.   Baseline: 1-3x/week reported  Goal status: INITIAL     PLAN:  PT FREQUENCY: 1x/week  PT DURATION: 6 weeks  PLANNED INTERVENTIONS: Therapeutic exercises, Therapeutic activity, Neuromuscular re-education, Balance training, Gait training, Patient/Family education, Self Care, Joint mobilization, Joint manipulation, Stair training, DME instructions, Aquatic Therapy, Dry Needling, Electrical stimulation, Spinal manipulation, Spinal mobilization, Cryotherapy, Moist heat, Taping, Manual therapy, and Re-evaluation  PLAN FOR NEXT SESSION: Progress ROM/strengthening exercises as able/appropriate, review HEP. Emphasis on postural strengthening and core stability   Ashley Murrain PT, DPT 08/28/2022 6:03 PM

## 2022-08-28 ENCOUNTER — Other Ambulatory Visit: Payer: Self-pay

## 2022-08-28 ENCOUNTER — Encounter: Payer: Self-pay | Admitting: Physical Therapy

## 2022-08-28 ENCOUNTER — Ambulatory Visit: Payer: 59 | Attending: Plastic Surgery | Admitting: Physical Therapy

## 2022-08-28 DIAGNOSIS — M546 Pain in thoracic spine: Secondary | ICD-10-CM | POA: Insufficient documentation

## 2022-08-28 DIAGNOSIS — M6281 Muscle weakness (generalized): Secondary | ICD-10-CM | POA: Diagnosis not present

## 2022-08-28 DIAGNOSIS — R293 Abnormal posture: Secondary | ICD-10-CM | POA: Insufficient documentation

## 2022-08-28 DIAGNOSIS — M5459 Other low back pain: Secondary | ICD-10-CM | POA: Diagnosis not present

## 2022-09-05 ENCOUNTER — Emergency Department (HOSPITAL_BASED_OUTPATIENT_CLINIC_OR_DEPARTMENT_OTHER)
Admission: EM | Admit: 2022-09-05 | Discharge: 2022-09-05 | Disposition: A | Discharge status: Home / Self Care | Payer: 59 | Attending: Emergency Medicine | Admitting: Emergency Medicine

## 2022-09-05 ENCOUNTER — Other Ambulatory Visit: Payer: Self-pay

## 2022-09-05 ENCOUNTER — Encounter (HOSPITAL_BASED_OUTPATIENT_CLINIC_OR_DEPARTMENT_OTHER): Payer: Self-pay

## 2022-09-05 ENCOUNTER — Other Ambulatory Visit (HOSPITAL_BASED_OUTPATIENT_CLINIC_OR_DEPARTMENT_OTHER): Payer: Self-pay

## 2022-09-05 ENCOUNTER — Encounter: Payer: Self-pay | Admitting: Physician Assistant

## 2022-09-05 DIAGNOSIS — K047 Periapical abscess without sinus: Secondary | ICD-10-CM | POA: Insufficient documentation

## 2022-09-05 DIAGNOSIS — K0889 Other specified disorders of teeth and supporting structures: Secondary | ICD-10-CM | POA: Diagnosis present

## 2022-09-05 MED ORDER — OXYCODONE HCL 5 MG PO TABS
2.5000 mg | ORAL_TABLET | ORAL | 0 refills | Status: DC | PRN
  Filled 2022-09-05: qty 12, 2d supply, fill #0

## 2022-09-05 MED ORDER — CLINDAMYCIN HCL 300 MG PO CAPS
300.0000 mg | ORAL_CAPSULE | Freq: Four times a day (QID) | ORAL | 0 refills | Status: DC
  Filled 2022-09-05: qty 28, 7d supply, fill #0

## 2022-09-05 MED ORDER — CLINDAMYCIN HCL 300 MG PO CAPS: 300.0000 mg | ORAL_CAPSULE | Freq: Four times a day (QID) | ORAL | 0 refills | Status: DC

## 2022-09-05 NOTE — ED Notes (Signed)
Pt discharged to home using teachback Method. Discharge instructions have been discussed with patient and/or family members. Pt verbally acknowledges understanding d/c instructions, has been given opportunity for questions to be answered, and endorses comprehension to checkout at registration before leaving.  

## 2022-09-05 NOTE — ED Provider Notes (Signed)
[place] EMERGENCY DEPARTMENT AT [facility] Provider Note   CSN: [id number] Arrival date & time: 09/05/22  1408     History  Chief Complaint  Patient presents with   Dental Pain    Paige Fritz is a 32 y.o. female who presents emergency department chief complaint of dental pain.  She has had dental pain for few days and has actually been on amoxicillin for the past 3.  She is been taking Tylenol which is helpful during the day but states that at night her tooth is throbbing and she has been unable to sleep.  She has a scheduled follow-up appointment both with a dentist and an oral surgeon however it is not till the middle of the month.  She has been unable to tolerate the pain and was hoping to get something to help increase her pain control at night.   Dental Pain      Home Medications Prior to Admission medications   Medication Sig Start Date End Date Taking? Authorizing Provider  clindamycin (CLEOCIN) 300 MG capsule Take 1 capsule (300 mg total) by mouth 4 (four) times daily. X 7 days 09/05/22  Yes Ozie Dimaria, PA-C  oxyCODONE (ROXICODONE) 5 MG immediate release tablet Take 0.5-1 tablets (2.5-5 mg total) by mouth every 4 (four) hours as needed for severe pain. 09/05/22  Yes Mahonri Seiden, PA-C  simvastatin (ZOCOR) 20 MG tablet Take 1 tablet (20 mg total) by mouth at bedtime. 08/21/22   Langston Reusing, MD  triamcinolone cream (KENALOG) 0.1 % Apply 1 Application topically 2 (two) times daily. 04/24/22   Eulis Foster, FNP  Vitamin D, Ergocalciferol, (DRISDOL) 1.25 MG (50000 UNIT) CAPS capsule Take 1 capsule (50,000 Units total) by mouth every 7 (seven) days. 08/21/22   Langston Reusing, MD      Allergies    Patient has no known allergies.    Review of Systems   Review of Systems  Physical Exam Updated Vital Signs BP (!) 139/96 (BP Location: Right Arm)   Pulse 89   Temp 98.1 F (36.7 C)   Resp 16   Ht 5\' 1"  (1.549 m)   Wt 95.3 kg   SpO2  100%   BMI 39.68 kg/m  Physical Exam Vitals and nursing note reviewed.  Constitutional:      General: She is not in acute distress.    Appearance: She is well-developed. She is not diaphoretic.  HENT:     Head: Normocephalic and atraumatic.     Right Ear: External ear normal.     Left Ear: External ear normal.     Nose: Nose normal.     Mouth/Throat:     Mouth: Mucous membranes are moist.     Comments: Right upper second and third molar with previous dental work including fillings.  There is erythema and bulging above the second molar consistent with a developing periapical abscess.  No fluctuance. Eyes:     General: No scleral icterus.    Conjunctiva/sclera: Conjunctivae normal.  Cardiovascular:     Rate and Rhythm: Normal rate and regular rhythm.     Heart sounds: Normal heart sounds. No murmur heard.    No friction rub. No gallop.  Pulmonary:     Effort: Pulmonary effort is normal. No respiratory distress.     Breath sounds: Normal breath sounds.  Abdominal:     General: Bowel sounds are normal. There is no distension.     Palpations: Abdomen is soft. There is no  mass.     Tenderness: There is no abdominal tenderness. There is no guarding.  Musculoskeletal:     Cervical back: Normal range of motion.  Skin:    General: Skin is warm and dry.  Neurological:     Mental Status: She is alert and oriented to person, place, and time.  Psychiatric:        Behavior: Behavior normal.     ED Results / Procedures / Treatments   Labs (all labs ordered are listed, but only abnormal results are displayed) Labs Reviewed - No data to display  EKG None  Radiology No results found.  Procedures Procedures    Medications Ordered in ED Medications - No data to display  ED Course/ Medical Decision Making/ A&P                             Medical Decision Making Risk Prescription drug management.   32 year old female here with periapical pain.  Patient has not had  improvement despite taking amoxicillin we will upgrade her antibiotics to clindamycin.  PDMP reviewed.  Pain medications ordered.  She has established follow-up.  I no evidence of deep space infection, facial swelling, or concern for Ludwig's angina.  She appears otherwise appropriate for discharge at this time       Final Clinical Impression(s) / ED Diagnoses Final diagnoses:  Dental infection    Rx / DC Orders ED Discharge Orders          Ordered    clindamycin (CLEOCIN) 300 MG capsule  4 times daily        09/05/22 1542    oxyCODONE (ROXICODONE) 5 MG immediate release tablet  Every 4 hours PRN        09/05/22 1542              Arthor Captain, PA-C 09/05/22 1545    Arby Barrette, MD 09/17/22 2010

## 2022-09-05 NOTE — ED Triage Notes (Signed)
Patient here POV from Home.  Endorses Dental Pain for a few days. Need for a Root Canal per patient. Has been taking Antibiotics recently. No Known Fevers.   NAD Noted during Triage. A&Ox4. GCS 15. Ambulatory.

## 2022-09-05 NOTE — Discharge Instructions (Signed)

## 2022-09-06 ENCOUNTER — Ambulatory Visit: Payer: 59 | Attending: Plastic Surgery | Admitting: Physical Therapy

## 2022-09-06 ENCOUNTER — Encounter: Payer: Self-pay | Admitting: Physical Therapy

## 2022-09-06 DIAGNOSIS — M6281 Muscle weakness (generalized): Secondary | ICD-10-CM | POA: Diagnosis not present

## 2022-09-06 DIAGNOSIS — R293 Abnormal posture: Secondary | ICD-10-CM

## 2022-09-06 DIAGNOSIS — M546 Pain in thoracic spine: Secondary | ICD-10-CM | POA: Insufficient documentation

## 2022-09-06 DIAGNOSIS — M5459 Other low back pain: Secondary | ICD-10-CM

## 2022-09-06 NOTE — Therapy (Addendum)
 OUTPATIENT PHYSICAL THERAPY TREATMENT  PHYSICAL THERAPY DISCHARGE SUMMARY  Visits from Start of Care: 2  Current functional level related to goals / functional outcomes: See goals   Remaining deficits: Current status unknown   Education / Equipment: HEP, posture ed, theraband.    Patient agrees to discharge. Patient goals were not met. Patient is being discharged due to not returning since the last visit.  Dona Klemann PT, DPT, LAT, ATC  08/08/23  10:59 AM        Patient Name: TAKISHA PELLE MRN: [id number] DOB:1990/08/28, 32 y.o., female Today's Date: 09/06/2022  END OF SESSION:  PT End of Session - 09/06/22 1416     Visit Number 2    Number of Visits 7    Date for PT Re-Evaluation 10/09/22    Authorization Type cone    PT Start Time 1415    PT Stop Time 1501    PT Time Calculation (min) 46 min    Activity Tolerance Patient tolerated treatment well;No increased pain    Behavior During Therapy WFL for tasks assessed/performed              Past Medical History:  Diagnosis Date   Anemia    fibroids.  iron infusion   Anxiety    Back pain    Headache(784.0)    UTI (urinary tract infection)    Past Surgical History:  Procedure Laterality Date   NO PAST SURGERIES     Patient Active Problem List   Diagnosis Date Noted   Symptomatic mammary hypertrophy 08/14/2022   Back pain 08/14/2022   Microcytic anemia 07/11/2021   Iron deficiency anemia 07/11/2021   Iron deficiency anemia due to chronic blood loss 07/11/2021    PCP: Jeani Sow, MD  REFERRING PROVIDER: Peggye Form, DO  REFERRING DIAG: Graciela Husbands (ICD-10-CM) - Symptomatic mammary hypertrophy M54.6,G89.29 (ICD-10-CM) - Chronic bilateral thoracic back pain  THERAPY DIAG:  Pain in thoracic spine  Other low back pain  Abnormal posture  Muscle weakness (generalized)  Rationale for Evaluation and Treatment: Rehabilitation  ONSET DATE: several years ago   SUBJECTIVE:                                                                                                                                                                                                          SUBJECTIVE STATEMENT: " I am doing okay, I still feel pain in the back of the left shoulder."  PERTINENT HISTORY:  anxiety  PAIN:  Are you having pain: 6/10 Location/description: low-mid back, up to neck  Best-worst over past week: unable to assess  - aggravating factors: lifting, getting out of bed - Easing factors: positioning, heating pad, peppermint oil   PRECAUTIONS: None  WEIGHT BEARING RESTRICTIONS: No  FALLS:  Has patient fallen in last 6 months? No  LIVING ENVIRONMENT: Apartment - ~20 steps 1 rails, lives w/ girlfriend. 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  PLOF: Independent  PATIENT GOALS: get surgery, be more active post surgery, feel more confident with exercise  NEXT MD VISIT: June 11th  OBJECTIVE:   DIAGNOSTIC FINDINGS:  No recent imaging   PATIENT SURVEYS:  FOTO 34 current, 54 predicted  COGNITION: Overall cognitive status: Within functional limits for tasks assessed  SENSATION: Mildly diminished LT RUE compared to L, but intact   POSTURE: significant forward head/rounded shoulders, increased lordosis  PALPATION: deferred   CERVICAL ROM:   Active ROM A/PROM (deg) eval  Flexion   Extension   Right lateral flexion   Left lateral flexion   Right rotation   Left rotation    (Blank rows = not tested) Comments: cervical ROM WFL and painless  Lumbar rotation painless and grossly symmetrical  Lumbar flexion concordant, able to reach mid shin  UPPER EXTREMITY ROM:  Active ROM Right eval Left eval  Shoulder flexion    Shoulder abduction    Shoulder internal rotation    Shoulder external rotation    Elbow flexion    Elbow extension     Wrist flexion    Wrist extension     (Blank rows = not tested) Comments:Mild end range shoulder/back pain with flexion/abd bilat but grossly WFL   UPPER EXTREMITY MMT:  MMT Right eval Left eval  Shoulder flexion 3+ 3+  Shoulder extension    Shoulder abduction 3+ 3+  Shoulder extension    Shoulder internal rotation 4+ 4  Shoulder external rotation 4+ 4+  Elbow flexion    Elbow extension    Grip strength    (Blank rows = not tested)  (Key: WFL = within functional limits not formally assessed, * = concordant pain, s = stiffness/stretching sensation, NT = not tested)  Comments:    FUNCTIONAL TESTS:  5 times sit to stand: 11.26 sec gentle UE support no pain  TODAY'S TREATMENT:                                                                                                                              [facility] Adult PT Treatment:                                                DATE: 09/06/22 Therapeutic Exercise: Scapular retraction 1 x 12 with RTB, 1 x 12 with GTB Scapular retraction with double ER  2 x 12 with GTB Standing horizontal abduction 2 x 12 with GTB Supine pec stretch over 2 rolled towels 2 x 30 sec Dead bug 4 x 10 sec hold combined with sustained posterior pelvic tilt  - tactile cues for proper form Manual: Mtpr to the L rhomboid Taught how to use theracane and benefits of technique.    Updated HEP for horizontal abd, money exercise, dead bug, and pec stretch  [facility] Adult PT Treatment:                                                DATE: 08/28/22 Therapeutic Exercise: Scapular retraction x10 cues for posture Seated chin tuck x10 cues for form HEP handout + education  PATIENT EDUCATION:  Education details: Pt education on PT impairments, prognosis, and POC. Informed consent. Rationale for interventions, safe/appropriate HEP performance Person educated: Patient Education method: Explanation, Demonstration, Tactile cues, Verbal cues, and Handouts Education  comprehension: verbalized understanding, returned demonstration, verbal cues required, tactile cues required, and needs further education    HOME EXERCISE PROGRAM: Access Code: [email or web address] URL: [email or web address] Date: 08/28/2022 Prepared by: Fransisco Hertz  Exercises - Seated Scapular Retraction  - 1 x daily - 7 x weekly - 3 sets - 10 reps - Seated Cervical Retraction  - 1 x daily - 7 x weekly - 3 sets - 10 reps  ASSESSMENT:  CLINICAL IMPRESSION: Patient arrives to session reporting continued pain in the posterior aspect of her left shoulder. She responded favorably to [facility] along the L posterior shoulder musculature. She did well with pec stretching and posterior shoulder activation. End of session she reported feeling better.    Eval:Pt is a 32 year old woman who arrives to PT evaluation on this date for back pain in setting of mammary hypertrophy per referral. Pt reports difficulty with daily activities, lifting, and prolonged positioning due to pain. During today's session pt demonstrates functional but painful GH ROM, concordant pain with lumbar flexion, and generalized postural weakness. which are limiting ability to perform aforementioned activities.  Recommend skilled PT to address aforementioned deficits to improve functional independence/tolerance. Pt departs today's session in no acute distress, all voiced questions/concerns addressed appropriately from PT perspective.    OBJECTIVE IMPAIRMENTS: decreased activity tolerance, decreased endurance, decreased mobility, decreased ROM, decreased strength, impaired perceived functional ability, improper body mechanics, postural dysfunction, and pain.   ACTIVITY LIMITATIONS: carrying, lifting, bending, sitting, standing, sleeping, transfers, bed mobility, and locomotion level  PARTICIPATION LIMITATIONS: meal prep, cleaning, laundry, driving, community activity, and occupation  PERSONAL FACTORS: Time since onset of  injury/illness/exacerbation and 1 comorbidity: anxiety  are also affecting patient's functional outcome.   REHAB POTENTIAL: Good  CLINICAL DECISION MAKING: Stable/uncomplicated  EVALUATION COMPLEXITY: Low   GOALS: Goals reviewed with patient? No  SHORT TERM GOALS: Target date: 09/18/2022 Pt will demonstrate appropriate understanding and performance of initially prescribed HEP in order to facilitate improved independence with management of symptoms.  Baseline: HEP provided on eval Goal status: INITIAL   2. Pt will score greater than or equal to 45 on FOTO in order to demonstrate improved perception of function due to symptoms.  Baseline: 34  Goal status: INITIAL    LONG TERM GOALS: Target date: 10/09/2022 Pt will score 54 on FOTO in order to demonstrate improved perception of function due to symptoms. Baseline: 34 Goal status: INITIAL  2. Pt  will report/demonstrate ability to sit for >1hr with less than 2pt increase in pain on NPS in order to improve tolerance to work activities.  Baseline: inc pain <36min sitting Goal status: INITIAL  3. Pt will demonstrate at least 4+/5 shoulder flexion/abduction MMT for improved symmetry of UE strength and improved tolerance to functional movements.  Baseline: see MMT chart above Goal status: INITIAL   4. Pt will report/demonstrate ability to lift up to 20# with less than 2 point increase on NPS in order to demonstrate improved tolerance to housework/community activities. Baseline: reports increased pain/difficulty lifting >15-20# Goal status: INITIAL   5. Pt will report reduction in neck pain-related headaches to no more than 1x/week in order to improve tolerance to functional activities.   Baseline: 1-3x/week reported  Goal status: INITIAL     PLAN:  PT FREQUENCY: 1x/week  PT DURATION: 6 weeks  PLANNED INTERVENTIONS: Therapeutic exercises, Therapeutic activity, Neuromuscular re-education, Balance training, Gait training,  Patient/Family education, Self Care, Joint mobilization, Joint manipulation, Stair training, DME instructions, Aquatic Therapy, Dry Needling, Electrical stimulation, Spinal manipulation, Spinal mobilization, Cryotherapy, Moist heat, Taping, Manual therapy, and Re-evaluation  PLAN FOR NEXT SESSION: Progress ROM/strengthening exercises as able/appropriate, review HEP. Emphasis on postural strengthening and core stability   Aliveah Gallant PT, DPT, LAT, ATC  09/06/22  3:08 PM

## 2022-09-11 ENCOUNTER — Ambulatory Visit (INDEPENDENT_AMBULATORY_CARE_PROVIDER_SITE_OTHER): Payer: 59 | Admitting: Family Medicine

## 2022-09-11 ENCOUNTER — Encounter (INDEPENDENT_AMBULATORY_CARE_PROVIDER_SITE_OTHER): Payer: Self-pay | Admitting: Family Medicine

## 2022-09-11 VITALS — BP 114/72 | HR 94 | Temp 98.2°F | Ht 61.0 in | Wt 210.0 lb

## 2022-09-11 DIAGNOSIS — E669 Obesity, unspecified: Secondary | ICD-10-CM | POA: Diagnosis not present

## 2022-09-11 DIAGNOSIS — Z6839 Body mass index (BMI) 39.0-39.9, adult: Secondary | ICD-10-CM | POA: Diagnosis not present

## 2022-09-11 DIAGNOSIS — E7849 Other hyperlipidemia: Secondary | ICD-10-CM

## 2022-09-11 DIAGNOSIS — Z6836 Body mass index (BMI) 36.0-36.9, adult: Secondary | ICD-10-CM

## 2022-09-11 DIAGNOSIS — E559 Vitamin D deficiency, unspecified: Secondary | ICD-10-CM | POA: Diagnosis not present

## 2022-09-11 MED ORDER — VITAMIN D (ERGOCALCIFEROL) 1.25 MG (50000 UNIT) PO CAPS: 50000.0000 [IU] | ORAL_CAPSULE | ORAL | 0 refills | Status: DC

## 2022-09-11 NOTE — Progress Notes (Signed)
Chief Complaint:   OBESITY Paige Fritz is here to discuss her progress with her obesity treatment plan along with follow-up of her obesity related diagnoses. Paige Fritz is on the Category 2 Plan+100 and states she is following her eating plan approximately 85% of the time. Paige Fritz states she is working out for 30 minutes 3-4 times per week.  Also walking 9000 steps 2 days/week.  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  She has gotten in 6k steps already today!  Last week the left side of her face and jaw was swollen due to a dental infection- has an appointment with the dentist in 3 days. She was started on clindamycin and oxycodone.  She was able to get more protein in daily with shakes.  She has noticed some taste dyscrasias with being on clindamycin.  She is getting in around [facility] of protein at lunch with a significant amount of protein for dinner.    Subjective:   1. Vitamin D deficiency Patient is on prescription Vitamin D.  Patient denies nausea, vomiting, muscle weakness, but is positive for fatigue.  Patient last vitamin D level was 20.5.  2. Other hyperlipidemia Patient last LDL 175, HDL 65.9, triglycerides 75.  Patient is on Zocor, patient restarted after oxycodone treatment for her tooth infection.  Assessment/Plan:   1. Vitamin D deficiency Refill- Vitamin D, Ergocalciferol, (DRISDOL) 1.25 MG (50000 UNIT) CAPS capsule; Take 1 capsule (50,000 Units total) by mouth every 7 (seven) days.  Dispense: 4 capsule; Refill: 0  2. Other hyperlipidemia Follow-up repeat labs in July.  3. BMI 36.0-36.9,adult  4. Obesity with current BMI of 39.7 Paige Fritz is currently in the action stage of change. As such, her goal is to continue with  weight loss efforts. She has agreed to the Category 2 Plan +100 cal and keeping a food journal and adhering to recommended goals of 1200-1300 calories and 85+ protein daily.   Discussed Qsymia with patient, will follow-up at next appointment.  Exercise goals: All adults should avoid inactivity. Some physical activity is better than none, and adults who participate in any amount of physical activity gain some health benefits.  Behavioral modification strategies: increasing lean protein intake, meal planning and cooking strategies, keeping healthy foods in the home, and planning for success.  Paige Fritz has agreed to follow-up with our clinic in 3 weeks. She was informed of the importance of frequent follow-up visits to maximize her success with intensive lifestyle modifications for her multiple health conditions.   Objective:   Blood pressure 114/72, pulse 94, temperature 98.2 F (36.8 C), height 5\' 1"  (1.549 m), weight 210 lb (95.3 kg), last menstrual period 08/29/2022, SpO2 100 %. Body mass index is 39.68 kg/m.  General: Cooperative, alert, well developed, in no acute distress. HEENT: Conjunctivae and lids unremarkable. Cardiovascular: Regular rhythm.  Lungs: Normal work of breathing. Neurologic: No focal deficits.   Lab Results  Component Value Date   CREATININE 0.71 06/22/2022   BUN 11 06/22/2022   NA 137 06/22/2022   K 3.9 06/22/2022   CL 104 06/22/2022   CO2 28 06/22/2022   Lab Results  Component Value Date   ALT 8 06/22/2022   AST 13 (L) 06/22/2022   ALKPHOS 49 06/22/2022  BILITOT 0.3 06/22/2022   Lab Results  Component Value Date   HGBA1C 5.3 07/24/2022   HGBA1C 5.4 06/21/2021   Lab Results  Component Value Date   INSULIN 12.0 07/24/2022   INSULIN 10.7 06/21/2021   Lab Results  Component Value Date   TSH 3.390 06/21/2021   Lab Results  Component Value Date   CHOL 256 (H) 05/15/2022   HDL 65.90 05/15/2022   LDLCALC 175 (H) 05/15/2022   TRIG 75.0  05/15/2022   CHOLHDL 4 05/15/2022   Lab Results  Component Value Date   VD25OH 20.5 (L) 07/24/2022   VD25OH 34.01 09/06/2021   VD25OH 5.9 (L) 06/21/2021   Lab Results  Component Value Date   WBC 5.6 06/22/2022   HGB 12.1 06/22/2022   HCT 36.1 06/22/2022   MCV 93.3 06/22/2022   PLT 324 06/22/2022   Lab Results  Component Value Date   IRON 46 06/22/2022   TIBC 360 06/22/2022   FERRITIN 12 06/22/2022   Attestation Statements:   Reviewed by clinician on day of visit: allergies, medications, problem list, medical history, surgical history, family history, social history, and previous encounter notes.  I, Malcolm Metro, RMA, am acting as transcriptionist for Reuben Likes, MD.  I have reviewed the above documentation for accuracy and completeness, and I agree with the above. - Reuben Likes, MD

## 2022-09-12 ENCOUNTER — Encounter: Payer: Self-pay | Admitting: Physical Therapy

## 2022-09-12 ENCOUNTER — Ambulatory Visit: Payer: 59 | Admitting: Physical Therapy

## 2022-09-12 DIAGNOSIS — M6281 Muscle weakness (generalized): Secondary | ICD-10-CM

## 2022-09-12 DIAGNOSIS — M5459 Other low back pain: Secondary | ICD-10-CM

## 2022-09-12 DIAGNOSIS — M546 Pain in thoracic spine: Secondary | ICD-10-CM

## 2022-09-12 DIAGNOSIS — R293 Abnormal posture: Secondary | ICD-10-CM

## 2022-09-12 NOTE — Therapy (Signed)
OUTPATIENT PHYSICAL THERAPY TREATMENT   Patient Name: FAYDRA SCHWEBEL MRN: [id number] DOB:05/25/1990, 32 y.o., female Today's Date: 09/12/2022  END OF SESSION:  PT End of Session - 09/12/22 1806     Visit Number 3    Date for PT Re-Evaluation 10/09/22    PT Start Time 1800    PT Stop Time 1840    PT Time Calculation (min) 40 min    Activity Tolerance Patient tolerated treatment well;No increased pain    Behavior During Therapy WFL for tasks assessed/performed               Past Medical History:  Diagnosis Date   Anemia    fibroids.  iron infusion   Anxiety    Back pain    Headache(784.0)    UTI (urinary tract infection)    Past Surgical History:  Procedure Laterality Date   NO PAST SURGERIES     Patient Active Problem List   Diagnosis Date Noted   Symptomatic mammary hypertrophy 08/14/2022   Back pain 08/14/2022   Microcytic anemia 07/11/2021   Iron deficiency anemia 07/11/2021   Iron deficiency anemia due to chronic blood loss 07/11/2021    PCP: Jeani Sow, MD  REFERRING PROVIDER: Peggye Form, DO  REFERRING DIAG: Graciela Husbands (ICD-10-CM) - Symptomatic mammary hypertrophy M54.6,G89.29 (ICD-10-CM) - Chronic bilateral thoracic back pain  THERAPY DIAG:  Pain in thoracic spine  Other low back pain  Abnormal posture  Muscle weakness (generalized)  Rationale for Evaluation and Treatment: Rehabilitation  ONSET DATE: several years ago   SUBJECTIVE:                                                                                                                                                                                                         SUBJECTIVE STATEMENT: " I am doing okay, I still feel pain in the back of the left shoulder."  PERTINENT HISTORY:  anxiety  PAIN:  Are you having pain: 6/10 Location/description: low-mid back, up to neck Best-worst over past week: unable to assess  - aggravating factors: lifting, getting out of  bed - Easing factors: positioning, heating pad, peppermint oil   PRECAUTIONS: None  WEIGHT BEARING RESTRICTIONS: No  FALLS:  Has patient fallen in last 6 months? No  LIVING ENVIRONMENT: Apartment - ~20 steps 1 rails, lives w/ girlfriend. 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  PLOF: Independent  PATIENT GOALS: get surgery, be more active post surgery,  feel more confident with exercise  NEXT MD VISIT: June 11th  OBJECTIVE:   DIAGNOSTIC FINDINGS:  No recent imaging   PATIENT SURVEYS:  FOTO 34 current, 54 predicted  COGNITION: Overall cognitive status: Within functional limits for tasks assessed  SENSATION: Mildly diminished LT RUE compared to L, but intact   POSTURE: significant forward head/rounded shoulders, increased lordosis  PALPATION: deferred   CERVICAL ROM:   Active ROM A/PROM (deg) eval  Flexion   Extension   Right lateral flexion   Left lateral flexion   Right rotation   Left rotation    (Blank rows = not tested) Comments: cervical ROM WFL and painless  Lumbar rotation painless and grossly symmetrical  Lumbar flexion concordant, able to reach mid shin  UPPER EXTREMITY ROM:  Active ROM Right eval Left eval  Shoulder flexion    Shoulder abduction    Shoulder internal rotation    Shoulder external rotation    Elbow flexion    Elbow extension    Wrist flexion    Wrist extension     (Blank rows = not tested) Comments:Mild end range shoulder/back pain with flexion/abd bilat but grossly WFL   UPPER EXTREMITY MMT:  MMT Right eval Left eval  Shoulder flexion 3+ 3+  Shoulder extension    Shoulder abduction 3+ 3+  Shoulder extension    Shoulder internal rotation 4+ 4  Shoulder external rotation 4+ 4+  Elbow flexion    Elbow extension    Grip strength    (Blank rows = not tested)  (Key: WFL = within functional limits not formally  assessed, * = concordant pain, s = stiffness/stretching sensation, NT = not tested)  Comments:    FUNCTIONAL TESTS:  5 times sit to stand: 11.26 sec gentle UE support no pain  TODAY'S TREATMENT:                                                                                                                              [facility] Adult PT Treatment:                                                 DATE:  09/12/22 NuStep L5 x 6 minutes Lower trunk assessment, tight in ant tilt, but able to achieve PPT and hold. Tight hip flexors Clamshells with g tband 2 x 10 reps Attempted bridge, but it cause low back pain Min squats x 10 Side to side step with G tband at knees x 10 Hip flexor stretch in sitting Cat/cow, de-emphasized cow due to excessive lordosis Forward flexion stretch over physioball  09/06/22 Therapeutic Exercise: Scapular retraction 1 x 12 with RTB, 1 x 12 with GTB Scapular retraction with double ER 2 x 12 with GTB Standing horizontal abduction 2 x 12 with GTB Supine pec stretch over 2 rolled  towels 2 x 30 sec Dead bug 4 x 10 sec hold combined with sustained posterior pelvic tilt  - tactile cues for proper form Manual: Mtpr to the L rhomboid Taught how to use theracane and benefits of technique.    Updated HEP for horizontal abd, money exercise, dead bug, and pec stretch  [facility] Adult PT Treatment:                                                DATE: 08/28/22 Therapeutic Exercise: Scapular retraction x10 cues for posture Seated chin tuck x10 cues for form HEP handout + education  PATIENT EDUCATION:  Education details: Pt education on PT impairments, prognosis, and POC. Informed consent. Rationale for interventions, safe/appropriate HEP performance Person educated: Patient Education method: Explanation, Demonstration, Tactile cues, Verbal cues, and Handouts Education comprehension: verbalized understanding, returned demonstration, verbal cues required, tactile cues required, and  needs further education    HOME EXERCISE PROGRAM: Access Code: [email or web address] URL: [email or web address] Date: 08/28/2022 Prepared by: Fransisco Hertz  Exercises - Seated Scapular Retraction  - 1 x daily - 7 x weekly - 3 sets - 10 reps - Seated Cervical Retraction  - 1 x daily - 7 x weekly - 3 sets - 10 reps  ASSESSMENT:  CLINICAL IMPRESSION: Patient reports no new issues. She is performing HEP. Treatment focused on lower body strength and stabilization exercises to help control her excessive lumbar lordosis. HEP updated. Plan to focus on Thoracic spine mobs and stability on next visit.   Eval:Pt is a 32 year old woman who arrives to PT evaluation on this date for back pain in setting of mammary hypertrophy per referral. Pt reports difficulty with daily activities, lifting, and prolonged positioning due to pain. During today's session pt demonstrates functional but painful GH ROM, concordant pain with lumbar flexion, and generalized postural weakness. which are limiting ability to perform aforementioned activities.  Recommend skilled PT to address aforementioned deficits to improve functional independence/tolerance. Pt departs today's session in no acute distress, all voiced questions/concerns addressed appropriately from PT perspective.    OBJECTIVE IMPAIRMENTS: decreased activity tolerance, decreased endurance, decreased mobility, decreased ROM, decreased strength, impaired perceived functional ability, improper body mechanics, postural dysfunction, and pain.   ACTIVITY LIMITATIONS: carrying, lifting, bending, sitting, standing, sleeping, transfers, bed mobility, and locomotion level  PARTICIPATION LIMITATIONS: meal prep, cleaning, laundry, driving, community activity, and occupation  PERSONAL FACTORS: Time since onset of injury/illness/exacerbation and 1 comorbidity: anxiety  are also affecting patient's functional outcome.   REHAB POTENTIAL: Good  CLINICAL DECISION MAKING:  Stable/uncomplicated  EVALUATION COMPLEXITY: Low   GOALS: Goals reviewed with patient? No  SHORT TERM GOALS: Target date: 09/18/2022 Pt will demonstrate appropriate understanding and performance of initially prescribed HEP in order to facilitate improved independence with management of symptoms.  Baseline: HEP provided on eval Goal status: 09/12/22-met   2. Pt will score greater than or equal to 45 on FOTO in order to demonstrate improved perception of function due to symptoms.  Baseline: 34  Goal status: INITIAL    LONG TERM GOALS: Target date: 10/09/2022 Pt will score 54 on FOTO in order to demonstrate improved perception of function due to symptoms. Baseline: 34 Goal status: INITIAL  2. Pt will report/demonstrate ability to sit for >1hr with less than 2pt increase in pain on NPS in order to improve tolerance to work activities.  Baseline: inc pain <24min sitting Goal status: INITIAL  3. Pt will demonstrate at least 4+/5 shoulder flexion/abduction MMT for improved symmetry of UE strength and improved tolerance to functional movements.  Baseline: see MMT chart above Goal status: INITIAL   4. Pt will report/demonstrate ability to lift up to 20# with less than 2 point increase on NPS in order to demonstrate improved tolerance to housework/community activities. Baseline: reports increased pain/difficulty lifting >15-20# Goal status: INITIAL   5. Pt will report reduction in neck pain-related headaches to no more than 1x/week in order to improve tolerance to functional activities.   Baseline: 1-3x/week reported  Goal status: INITIAL     PLAN:  PT FREQUENCY: 1x/week  PT DURATION: 6 weeks  PLANNED INTERVENTIONS: Therapeutic exercises, Therapeutic activity, Neuromuscular re-education, Balance training, Gait training, Patient/Family education, Self Care, Joint mobilization, Joint manipulation, Stair training, DME instructions, Aquatic Therapy, Dry Needling, Electrical stimulation,  Spinal manipulation, Spinal mobilization, Cryotherapy, Moist heat, Taping, Manual therapy, and Re-evaluation  PLAN FOR NEXT SESSION: Progress ROM/strengthening exercises as able/appropriate, review HEP. Emphasis on postural strengthening and core stability  Oley Balm, PT/DPT

## 2022-09-19 ENCOUNTER — Encounter: Payer: Self-pay | Admitting: Physical Therapy

## 2022-09-19 ENCOUNTER — Ambulatory Visit: Payer: 59 | Admitting: Physical Therapy

## 2022-09-19 DIAGNOSIS — M6281 Muscle weakness (generalized): Secondary | ICD-10-CM

## 2022-09-19 DIAGNOSIS — R293 Abnormal posture: Secondary | ICD-10-CM

## 2022-09-19 DIAGNOSIS — M546 Pain in thoracic spine: Secondary | ICD-10-CM | POA: Diagnosis not present

## 2022-09-19 DIAGNOSIS — M5459 Other low back pain: Secondary | ICD-10-CM

## 2022-09-19 NOTE — Therapy (Signed)
OUTPATIENT PHYSICAL THERAPY TREATMENT   Patient Name: Paige Fritz MRN: [id number] DOB:[birth year], 32 y.o., female Today's Date: 09/19/2022  END OF SESSION:  PT End of Session - 09/19/22 1807     Visit Number 4    Date for PT Re-Evaluation 10/09/22    PT Start Time 1803    PT Stop Time 1843    PT Time Calculation (min) 40 min    Activity Tolerance Patient tolerated treatment well    Behavior During Therapy [facility] for tasks assessed/performed                Past Medical History:  Diagnosis Date   Anemia    fibroids.  iron infusion   Anxiety    Back pain    Headache(784.0)    UTI (urinary tract infection)    Past Surgical History:  Procedure Laterality Date   NO PAST SURGERIES     Patient Active Problem List   Diagnosis Date Noted   Symptomatic mammary hypertrophy 08/14/2022   Back pain 08/14/2022   Microcytic anemia 07/11/2021   Iron deficiency anemia 07/11/2021   Iron deficiency anemia due to chronic blood loss 07/11/2021    PCP: Jeani Sow, MD  REFERRING PROVIDER: Peggye Form, DO  REFERRING DIAG: Graciela Husbands (ICD-10-CM) - Symptomatic mammary hypertrophy M54.6,G89.29 (ICD-10-CM) - Chronic bilateral thoracic back pain  THERAPY DIAG:  Pain in thoracic spine  Other low back pain  Abnormal posture  Muscle weakness (generalized)  Rationale for Evaluation and Treatment: Rehabilitation  ONSET DATE: several years ago   SUBJECTIVE:                                                                                                                                                                                                         SUBJECTIVE STATEMENT: Patient reports she is tired after a full day of work. Continues to have back pain.  PERTINENT HISTORY:  anxiety  PAIN:  Are you having pain: 6/10 Location/description: low-mid back, up to neck Best-worst over past week: unable to assess  - aggravating factors: lifting, getting out of  bed - Easing factors: positioning, heating pad, peppermint oil   PRECAUTIONS: None  WEIGHT BEARING RESTRICTIONS: No  FALLS:  Has patient fallen in last 6 months? No  LIVING ENVIRONMENT: Apartment - ~20 steps 1 rails, lives w/ girlfriend. 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  PLOF: Independent  PATIENT GOALS: get surgery, be more active post surgery, feel  more confident with exercise  NEXT MD VISIT: June 11th  OBJECTIVE:   DIAGNOSTIC FINDINGS:  No recent imaging   PATIENT SURVEYS:  FOTO 34 current, 54 predicted  COGNITION: Overall cognitive status: Within functional limits for tasks assessed  SENSATION: Mildly diminished LT RUE compared to L, but intact   POSTURE: significant forward head/rounded shoulders, increased lordosis  PALPATION: deferred   CERVICAL ROM:   Active ROM A/PROM (deg) eval  Flexion   Extension   Right lateral flexion   Left lateral flexion   Right rotation   Left rotation    (Blank rows = not tested) Comments: cervical ROM WFL and painless  Lumbar rotation painless and grossly symmetrical  Lumbar flexion concordant, able to reach mid shin  UPPER EXTREMITY ROM:  Active ROM Right eval Left eval  Shoulder flexion    Shoulder abduction    Shoulder internal rotation    Shoulder external rotation    Elbow flexion    Elbow extension    Wrist flexion    Wrist extension     (Blank rows = not tested) Comments:Mild end range shoulder/back pain with flexion/abd bilat but grossly WFL   UPPER EXTREMITY MMT:  MMT Right eval Left eval  Shoulder flexion 3+ 3+  Shoulder extension    Shoulder abduction 3+ 3+  Shoulder extension    Shoulder internal rotation 4+ 4  Shoulder external rotation 4+ 4+  Elbow flexion    Elbow extension    Grip strength    (Blank rows = not tested)  (Key: WFL = within functional limits not formally  assessed, * = concordant pain, s = stiffness/stretching sensation, NT = not tested)  Comments:    FUNCTIONAL TESTS:  5 times sit to stand: 11.26 sec gentle UE support no pain  TODAY'S TREATMENT:                                                                                                                              [facility] Adult PT Treatment:                                                 DATE:  09/19/22 NuStep L5 x 6 minutes Supine pelvic tilts, 8 x 10 seconds Seated on physiodisc, weight shifts, for/back, side to side, then clock pattern x 5 in each direction. Seated weight shifts in rotation with B hands on the mat to R, x 6, repeat to L. Thread the needle Standing shoulder ext, rows, ER against G tband, 2 x 10 reps each.  09/12/22 NuStep L5 x 6 minutes Lower trunk assessment, tight in ant tilt, but able to achieve PPT and hold. Tight hip flexors Clamshells with g tband 2 x 10 reps Attempted bridge, but it cause low back pain Min squats x 10 Side to side step  with G tband at knees x 10 Hip flexor stretch in sitting Cat/cow, de-emphasized cow due to excessive lordosis Forward flexion stretch over physioball  09/06/22 Therapeutic Exercise: Scapular retraction 1 x 12 with RTB, 1 x 12 with GTB Scapular retraction with double ER 2 x 12 with GTB Standing horizontal abduction 2 x 12 with GTB Supine pec stretch over 2 rolled towels 2 x 30 sec Dead bug 4 x 10 sec hold combined with sustained posterior pelvic tilt  - tactile cues for proper form Manual: Mtpr to the L rhomboid Taught how to use theracane and benefits of technique.    Updated HEP for horizontal abd, money exercise, dead bug, and pec stretch  [facility] Adult PT Treatment:                                                DATE: 08/28/22 Therapeutic Exercise: Scapular retraction x10 cues for posture Seated chin tuck x10 cues for form HEP handout + education  PATIENT EDUCATION:  Education details: Pt education on PT  impairments, prognosis, and POC. Informed consent. Rationale for interventions, safe/appropriate HEP performance Person educated: Patient Education method: Explanation, Demonstration, Tactile cues, Verbal cues, and Handouts Education comprehension: verbalized understanding, returned demonstration, verbal cues required, tactile cues required, and needs further education    HOME EXERCISE PROGRAM: Access Code: [email or web address] URL: [email or web address] Date: 08/28/2022 Prepared by: Fransisco Hertz  Exercises - Seated Scapular Retraction  - 1 x daily - 7 x weekly - 3 sets - 10 reps - Seated Cervical Retraction  - 1 x daily - 7 x weekly - 3 sets - 10 reps  ASSESSMENT:  CLINICAL IMPRESSION: Patient reports continued LBP. She does feel like the exercises are working the muscles in a positive way. Introduced some additional trunk mobilization and stability exercises to continue to address her pain. Updated HEP to include the new exercises.  Eval:Pt is a 32 year old woman who arrives to PT evaluation on this date for back pain in setting of mammary hypertrophy per referral. Pt reports difficulty with daily activities, lifting, and prolonged positioning due to pain. During today's session pt demonstrates functional but painful GH ROM, concordant pain with lumbar flexion, and generalized postural weakness. which are limiting ability to perform aforementioned activities.  Recommend skilled PT to address aforementioned deficits to improve functional independence/tolerance. Pt departs today's session in no acute distress, all voiced questions/concerns addressed appropriately from PT perspective.    OBJECTIVE IMPAIRMENTS: decreased activity tolerance, decreased endurance, decreased mobility, decreased ROM, decreased strength, impaired perceived functional ability, improper body mechanics, postural dysfunction, and pain.   ACTIVITY LIMITATIONS: carrying, lifting, bending, sitting, standing, sleeping,  transfers, bed mobility, and locomotion level  PARTICIPATION LIMITATIONS: meal prep, cleaning, laundry, driving, community activity, and occupation  PERSONAL FACTORS: Time since onset of injury/illness/exacerbation and 1 comorbidity: anxiety  are also affecting patient's functional outcome.   REHAB POTENTIAL: Good  CLINICAL DECISION MAKING: Stable/uncomplicated  EVALUATION COMPLEXITY: Low   GOALS: Goals reviewed with patient? No  SHORT TERM GOALS: Target date: 09/18/2022 Pt will demonstrate appropriate understanding and performance of initially prescribed HEP in order to facilitate improved independence with management of symptoms.  Baseline: HEP provided on eval Goal status: 09/12/22-met   2. Pt will score greater than or equal to 45 on FOTO in order to demonstrate improved perception of function due to symptoms.  Baseline:  34  Goal status: INITIAL    LONG TERM GOALS: Target date: 10/09/2022 Pt will score 54 on FOTO in order to demonstrate improved perception of function due to symptoms. Baseline: 34 Goal status: INITIAL  2. Pt will report/demonstrate ability to sit for >1hr with less than 2pt increase in pain on NPS in order to improve tolerance to work activities.  Baseline: inc pain <92min sitting Goal status: INITIAL  3. Pt will demonstrate at least 4+/5 shoulder flexion/abduction MMT for improved symmetry of UE strength and improved tolerance to functional movements.  Baseline: see MMT chart above Goal status: INITIAL   4. Pt will report/demonstrate ability to lift up to 20# with less than 2 point increase on NPS in order to demonstrate improved tolerance to housework/community activities. Baseline: reports increased pain/difficulty lifting >15-20# Goal status: INITIAL   5. Pt will report reduction in neck pain-related headaches to no more than 1x/week in order to improve tolerance to functional activities.   Baseline: 1-3x/week reported  Goal status: INITIAL      PLAN:  PT FREQUENCY: 1x/week  PT DURATION: 6 weeks  PLANNED INTERVENTIONS: Therapeutic exercises, Therapeutic activity, Neuromuscular re-education, Balance training, Gait training, Patient/Family education, Self Care, Joint mobilization, Joint manipulation, Stair training, DME instructions, Aquatic Therapy, Dry Needling, Electrical stimulation, Spinal manipulation, Spinal mobilization, Cryotherapy, Moist heat, Taping, Manual therapy, and Re-evaluation  PLAN FOR NEXT SESSION: Progress ROM/strengthening exercises as able/appropriate, review HEP. Emphasis on postural strengthening and core stability  Oley Balm, PT/DPT

## 2022-09-26 ENCOUNTER — Encounter: Payer: Self-pay | Admitting: Physical Therapy

## 2022-09-26 ENCOUNTER — Ambulatory Visit: Payer: 59 | Admitting: Physical Therapy

## 2022-09-26 DIAGNOSIS — M6281 Muscle weakness (generalized): Secondary | ICD-10-CM | POA: Diagnosis not present

## 2022-09-26 DIAGNOSIS — R293 Abnormal posture: Secondary | ICD-10-CM

## 2022-09-26 DIAGNOSIS — M5459 Other low back pain: Secondary | ICD-10-CM | POA: Diagnosis not present

## 2022-09-26 DIAGNOSIS — M546 Pain in thoracic spine: Secondary | ICD-10-CM | POA: Diagnosis not present

## 2022-09-26 NOTE — Therapy (Signed)
OUTPATIENT PHYSICAL THERAPY TREATMENT   Patient Name: Paige Fritz MRN: [id number] DOB:1991-01-18, 32 y.o., female Today's Date: 09/26/2022  END OF SESSION:  PT End of Session - 09/26/22 1803     Visit Number 5    Date for PT Re-Evaluation 10/09/22    PT Start Time 1757    PT Stop Time 1838    PT Time Calculation (min) 41 min    Activity Tolerance Patient tolerated treatment well    Behavior During Therapy [facility] for tasks assessed/performed                 Past Medical History:  Diagnosis Date   Anemia    fibroids.  iron infusion   Anxiety    Back pain    Headache(784.0)    UTI (urinary tract infection)    Past Surgical History:  Procedure Laterality Date   NO PAST SURGERIES     Patient Active Problem List   Diagnosis Date Noted   Symptomatic mammary hypertrophy 08/14/2022   Back pain 08/14/2022   Microcytic anemia 07/11/2021   Iron deficiency anemia 07/11/2021   Iron deficiency anemia due to chronic blood loss 07/11/2021    PCP: Jeani Sow, MD  REFERRING PROVIDER: Peggye Form, DO  REFERRING DIAG: Graciela Husbands (ICD-10-CM) - Symptomatic mammary hypertrophy M54.6,G89.29 (ICD-10-CM) - Chronic bilateral thoracic back pain  THERAPY DIAG:  Pain in thoracic spine  Other low back pain  Abnormal posture  Muscle weakness (generalized)  Rationale for Evaluation and Treatment: Rehabilitation  ONSET DATE: several years ago   SUBJECTIVE:                                                                                                                                                                                                         SUBJECTIVE STATEMENT: Patient reports she is tired after a full day of work. Continues to have back pain.  PERTINENT HISTORY:  anxiety  PAIN:  Are you having pain: 6/10 Location/description: low-mid back, up to neck Best-worst over past week: unable to assess  - aggravating factors: lifting, getting out of  bed - Easing factors: positioning, heating pad, peppermint oil   PRECAUTIONS: None  WEIGHT BEARING RESTRICTIONS: No  FALLS:  Has patient fallen in last 6 months? No  LIVING ENVIRONMENT: Apartment - ~20 steps 1 rails, lives w/ girlfriend. 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  PLOF: Independent  PATIENT GOALS: get surgery, be more active post surgery,  feel more confident with exercise  NEXT MD VISIT: June 11th  OBJECTIVE:   DIAGNOSTIC FINDINGS:  No recent imaging   PATIENT SURVEYS:  FOTO 34 current, 54 predicted  COGNITION: Overall cognitive status: Within functional limits for tasks assessed  SENSATION: Mildly diminished LT RUE compared to L, but intact   POSTURE: significant forward head/rounded shoulders, increased lordosis  PALPATION: deferred   CERVICAL ROM:   Active ROM A/PROM (deg) eval  Flexion   Extension   Right lateral flexion   Left lateral flexion   Right rotation   Left rotation    (Blank rows = not tested) Comments: cervical ROM WFL and painless  Lumbar rotation painless and grossly symmetrical  Lumbar flexion concordant, able to reach mid shin  UPPER EXTREMITY ROM:  Active ROM Right eval Left eval  Shoulder flexion    Shoulder abduction    Shoulder internal rotation    Shoulder external rotation    Elbow flexion    Elbow extension    Wrist flexion    Wrist extension     (Blank rows = not tested) Comments:Mild end range shoulder/back pain with flexion/abd bilat but grossly WFL   UPPER EXTREMITY MMT:  MMT Right eval Left eval  Shoulder flexion 3+ 3+  Shoulder extension    Shoulder abduction 3+ 3+  Shoulder extension    Shoulder internal rotation 4+ 4  Shoulder external rotation 4+ 4+  Elbow flexion    Elbow extension    Grip strength    (Blank rows = not tested)  (Key: WFL = within functional limits not formally  assessed, * = concordant pain, s = stiffness/stretching sensation, NT = not tested)  Comments:    FUNCTIONAL TESTS:  5 times sit to stand: 11.26 sec gentle UE support no pain  TODAY'S TREATMENT:                                                                                                                              [facility] Adult PT Treatment:                                                 DATE:  09/26/22 NuStep L4 x 6 minutes Strength training with weights-Shoulder ext and rows, palof press with 10#, 2 x 10 reps each Goblet squats with 4# weight to 20" height, 2 x 10 reps Lat pulls, 20#, 2 x 10 Chest press, 10#, 2 x 10 reps Seated trunk extension against black Tband, 2 x 10 reps Stretching/dynamic mobilizations-single arm doorway stretch with traction and rotation, flexion over physioball, forward and to each side, wall angels.  09/19/22 NuStep L5 x 6 minutes Supine pelvic tilts, 8 x 10 seconds Seated on physiodisc, weight shifts, for/back, side to side, then clock pattern x 5 in each direction. Seated weight shifts in  rotation with B hands on the mat to R, x 6, repeat to L. Thread the needle Standing shoulder ext, rows, ER against G tband, 2 x 10 reps each.  09/12/22 NuStep L5 x 6 minutes Lower trunk assessment, tight in ant tilt, but able to achieve PPT and hold. Tight hip flexors Clamshells with g tband 2 x 10 reps Attempted bridge, but it cause low back pain Min squats x 10 Side to side step with G tband at knees x 10 Hip flexor stretch in sitting Cat/cow, de-emphasized cow due to excessive lordosis Forward flexion stretch over physioball  09/06/22 Therapeutic Exercise: Scapular retraction 1 x 12 with RTB, 1 x 12 with GTB Scapular retraction with double ER 2 x 12 with GTB Standing horizontal abduction 2 x 12 with GTB Supine pec stretch over 2 rolled towels 2 x 30 sec Dead bug 4 x 10 sec hold combined with sustained posterior pelvic tilt  - tactile cues for proper  form Manual: Mtpr to the L rhomboid Taught how to use theracane and benefits of technique.    Updated HEP for horizontal abd, money exercise, dead bug, and pec stretch  [facility] Adult PT Treatment:                                                DATE: 08/28/22 Therapeutic Exercise: Scapular retraction x10 cues for posture Seated chin tuck x10 cues for form HEP handout + education  PATIENT EDUCATION:  Education details: Pt education on PT impairments, prognosis, and POC. Informed consent. Rationale for interventions, safe/appropriate HEP performance Person educated: Patient Education method: Explanation, Demonstration, Tactile cues, Verbal cues, and Handouts Education comprehension: verbalized understanding, returned demonstration, verbal cues required, tactile cues required, and needs further education    HOME EXERCISE PROGRAM: Access Code: [email or web address] URL: [email or web address] Date: 08/28/2022 Prepared by: Fransisco Hertz  Exercises - Seated Scapular Retraction  - 1 x daily - 7 x weekly - 3 sets - 10 reps - Seated Cervical Retraction  - 1 x daily - 7 x weekly - 3 sets - 10 reps  ASSESSMENT:  CLINICAL IMPRESSION: Patient fatigued today after a long day at work. She did, however, tolerate a progression of strength training and trunk stabilization activities with no increase in pain. She required mod VC and occ TC to maintain appropriate posture with exercises.  Eval:Pt is a 32 year old woman who arrives to PT evaluation on this date for back pain in setting of mammary hypertrophy per referral. Pt reports difficulty with daily activities, lifting, and prolonged positioning due to pain. During today's session pt demonstrates functional but painful GH ROM, concordant pain with lumbar flexion, and generalized postural weakness. which are limiting ability to perform aforementioned activities.  Recommend skilled PT to address aforementioned deficits to improve functional  independence/tolerance. Pt departs today's session in no acute distress, all voiced questions/concerns addressed appropriately from PT perspective.    OBJECTIVE IMPAIRMENTS: decreased activity tolerance, decreased endurance, decreased mobility, decreased ROM, decreased strength, impaired perceived functional ability, improper body mechanics, postural dysfunction, and pain.   ACTIVITY LIMITATIONS: carrying, lifting, bending, sitting, standing, sleeping, transfers, bed mobility, and locomotion level  PARTICIPATION LIMITATIONS: meal prep, cleaning, laundry, driving, community activity, and occupation  PERSONAL FACTORS: Time since onset of injury/illness/exacerbation and 1 comorbidity: anxiety  are also affecting patient's functional outcome.   REHAB POTENTIAL: Good  CLINICAL  DECISION MAKING: Stable/uncomplicated  EVALUATION COMPLEXITY: Low   GOALS: Goals reviewed with patient? No  SHORT TERM GOALS: Target date: 09/18/2022 Pt will demonstrate appropriate understanding and performance of initially prescribed HEP in order to facilitate improved independence with management of symptoms.  Baseline: HEP provided on eval Goal status: 09/12/22-met   2. Pt will score greater than or equal to 45 on FOTO in order to demonstrate improved perception of function due to symptoms.  Baseline: 34  Goal status: INITIAL    LONG TERM GOALS: Target date: 10/09/2022 Pt will score 54 on FOTO in order to demonstrate improved perception of function due to symptoms. Baseline: 34 Goal status: INITIAL  2. Pt will report/demonstrate ability to sit for >1hr with less than 2pt increase in pain on NPS in order to improve tolerance to work activities.  Baseline: inc pain <32min sitting Goal status: INITIAL  3. Pt will demonstrate at least 4+/5 shoulder flexion/abduction MMT for improved symmetry of UE strength and improved tolerance to functional movements.  Baseline: see MMT chart above Goal status: INITIAL   4. Pt  will report/demonstrate ability to lift up to 20# with less than 2 point increase on NPS in order to demonstrate improved tolerance to housework/community activities. Baseline: reports increased pain/difficulty lifting >15-20# Goal status: INITIAL   5. Pt will report reduction in neck pain-related headaches to no more than 1x/week in order to improve tolerance to functional activities.   Baseline: 1-3x/week reported  Goal status: INITIAL     PLAN:  PT FREQUENCY: 1x/week  PT DURATION: 6 weeks  PLANNED INTERVENTIONS: Therapeutic exercises, Therapeutic activity, Neuromuscular re-education, Balance training, Gait training, Patient/Family education, Self Care, Joint mobilization, Joint manipulation, Stair training, DME instructions, Aquatic Therapy, Dry Needling, Electrical stimulation, Spinal manipulation, Spinal mobilization, Cryotherapy, Moist heat, Taping, Manual therapy, and Re-evaluation  PLAN FOR NEXT SESSION: Progress ROM/strengthening exercises as able/appropriate, review HEP. Emphasis on postural strengthening and core stability  Oley Balm, PT/DPT

## 2022-09-27 ENCOUNTER — Inpatient Hospital Stay: Payer: 59 | Attending: Physician Assistant

## 2022-09-27 ENCOUNTER — Other Ambulatory Visit: Payer: Self-pay

## 2022-09-27 ENCOUNTER — Other Ambulatory Visit (HOSPITAL_COMMUNITY): Payer: Self-pay | Admitting: Physician Assistant

## 2022-09-27 DIAGNOSIS — N920 Excessive and frequent menstruation with regular cycle: Secondary | ICD-10-CM | POA: Diagnosis not present

## 2022-09-27 DIAGNOSIS — D500 Iron deficiency anemia secondary to blood loss (chronic): Secondary | ICD-10-CM | POA: Diagnosis not present

## 2022-09-27 LAB — CBC WITH DIFFERENTIAL (CANCER CENTER ONLY)
Abs Immature Granulocytes: 0.02 10*3/uL (ref 0.00–0.07)
Basophils Absolute: 0.1 10*3/uL (ref 0.0–0.1)
Basophils Relative: 1 %
Eosinophils Absolute: 0.1 10*3/uL (ref 0.0–0.5)
Eosinophils Relative: 1 %
HCT: 32.5 % — ABNORMAL LOW (ref 36.0–46.0)
Hemoglobin: 10.7 g/dL — ABNORMAL LOW (ref 12.0–15.0)
Immature Granulocytes: 0 %
Lymphocytes Relative: 28 %
Lymphs Abs: 2.1 10*3/uL (ref 0.7–4.0)
MCH: 29.8 pg (ref 26.0–34.0)
MCHC: 32.9 g/dL (ref 30.0–36.0)
MCV: 90.5 fL (ref 80.0–100.0)
Monocytes Absolute: 0.5 10*3/uL (ref 0.1–1.0)
Monocytes Relative: 6 %
Neutro Abs: 4.7 10*3/uL (ref 1.7–7.7)
Neutrophils Relative %: 64 %
Platelet Count: 338 10*3/uL (ref 150–400)
RBC: 3.59 MIL/uL — ABNORMAL LOW (ref 3.87–5.11)
RDW: 13.6 % (ref 11.5–15.5)
WBC Count: 7.3 10*3/uL (ref 4.0–10.5)
nRBC: 0 % (ref 0.0–0.2)

## 2022-09-27 LAB — IRON AND IRON BINDING CAPACITY (CC-WL,HP ONLY)
Iron: 49 ug/dL (ref 28–170)
Saturation Ratios: 13 % (ref 10.4–31.8)
TIBC: 372 ug/dL (ref 250–450)
UIBC: 323 ug/dL (ref 148–442)

## 2022-09-28 ENCOUNTER — Telehealth: Payer: Self-pay

## 2022-09-28 ENCOUNTER — Other Ambulatory Visit: Payer: Self-pay | Admitting: Physician Assistant

## 2022-09-28 LAB — FERRITIN: Ferritin: 8 ng/mL — ABNORMAL LOW (ref 11–307)

## 2022-09-28 NOTE — Telephone Encounter (Signed)
LM for pt with lab results and recommendations. [facility] scheduling has been advised

## 2022-09-28 NOTE — Telephone Encounter (Signed)
-----   Message from Briant Cedar, PA-C sent at 09/28/2022  3:41 PM EDT ----- Myriam Jacobson: Please notify patient that she has persistent iron deficiency and needs IV iron.  Vanessa: Can you schedule IV venofer 200 mg once a week x 5 doses starting next week. Please schedule labs and follow up with me in 8 weeks.    ----- Message ----- From: Leory Plowman, Lab In [place] Sent: 09/27/2022   4:09 PM EDT To: Briant Cedar, PA-C

## 2022-10-03 ENCOUNTER — Telehealth: Payer: Self-pay | Admitting: Physician Assistant

## 2022-10-03 ENCOUNTER — Ambulatory Visit (INDEPENDENT_AMBULATORY_CARE_PROVIDER_SITE_OTHER): Payer: 59 | Admitting: Family Medicine

## 2022-10-03 ENCOUNTER — Encounter (INDEPENDENT_AMBULATORY_CARE_PROVIDER_SITE_OTHER): Payer: Self-pay | Admitting: Family Medicine

## 2022-10-03 ENCOUNTER — Encounter: Payer: Self-pay | Admitting: Physical Therapy

## 2022-10-03 ENCOUNTER — Ambulatory Visit: Payer: 59 | Admitting: Physical Therapy

## 2022-10-03 VITALS — BP 129/85 | HR 91 | Temp 98.5°F | Ht 61.0 in | Wt 213.0 lb

## 2022-10-03 DIAGNOSIS — M546 Pain in thoracic spine: Secondary | ICD-10-CM | POA: Diagnosis not present

## 2022-10-03 DIAGNOSIS — E669 Obesity, unspecified: Secondary | ICD-10-CM | POA: Diagnosis not present

## 2022-10-03 DIAGNOSIS — M5459 Other low back pain: Secondary | ICD-10-CM

## 2022-10-03 DIAGNOSIS — E559 Vitamin D deficiency, unspecified: Secondary | ICD-10-CM | POA: Diagnosis not present

## 2022-10-03 DIAGNOSIS — F5089 Other specified eating disorder: Secondary | ICD-10-CM

## 2022-10-03 DIAGNOSIS — M6281 Muscle weakness (generalized): Secondary | ICD-10-CM

## 2022-10-03 DIAGNOSIS — Z6841 Body Mass Index (BMI) 40.0 and over, adult: Secondary | ICD-10-CM

## 2022-10-03 DIAGNOSIS — R293 Abnormal posture: Secondary | ICD-10-CM | POA: Diagnosis not present

## 2022-10-03 MED ORDER — VITAMIN D (ERGOCALCIFEROL) 1.25 MG (50000 UNIT) PO CAPS: 50000.0000 [IU] | ORAL_CAPSULE | ORAL | 0 refills | Status: DC

## 2022-10-03 MED ORDER — TOPIRAMATE 25 MG PO TABS
ORAL_TABLET | ORAL | 0 refills | Status: DC
Start: 2022-10-03 — End: 2022-10-31

## 2022-10-03 MED ORDER — LOMAIRA 8 MG PO TABS
ORAL_TABLET | ORAL | 0 refills | Status: DC
Start: 2022-10-03 — End: 2022-10-31

## 2022-10-03 NOTE — Therapy (Signed)
OUTPATIENT PHYSICAL THERAPY TREATMENT   Patient Name: EVALETTE COULTON MRN: [id number] DOB:Sep 22, 1990, 32 y.o., female Today's Date: 10/03/2022  END OF SESSION:  PT End of Session - 10/03/22 1805     Visit Number 6    Date for PT Re-Evaluation 10/09/22    PT Start Time 1802    PT Stop Time 1840    PT Time Calculation (min) 38 min    Activity Tolerance Patient tolerated treatment well    Behavior During Therapy [facility] for tasks assessed/performed                  Past Medical History:  Diagnosis Date   Anemia    fibroids.  iron infusion   Anxiety    Back pain    Headache(784.0)    UTI (urinary tract infection)    Past Surgical History:  Procedure Laterality Date   NO PAST SURGERIES     Patient Active Problem List   Diagnosis Date Noted   Symptomatic mammary hypertrophy 08/14/2022   Back pain 08/14/2022   Microcytic anemia 07/11/2021   Iron deficiency anemia 07/11/2021   Iron deficiency anemia due to chronic blood loss 07/11/2021    PCP: Jeani Sow, MD  REFERRING PROVIDER: Peggye Form, DO  REFERRING DIAG: N62 (ICD-10-CM) - Symptomatic mammary hypertrophy M54.6,G89.29 (ICD-10-CM) - Chronic bilateral thoracic back pain  THERAPY DIAG:  Pain in thoracic spine  Other low back pain  Abnormal posture  Muscle weakness (generalized)  Rationale for Evaluation and Treatment: Rehabilitation  ONSET DATE: several years ago   SUBJECTIVE:                                                                                                                                                                                                         SUBJECTIVE STATEMENT: Patient reports she woke up today with severe back pain. It did not travel, stayed local. Improved somewhat with Thoracic mobilizations. She also reports fluctuating neck pain.  PERTINENT HISTORY:  anxiety  PAIN:  Are you having pain: 6/10 Location/description: low-mid back, up to  neck Best-worst over past week: unable to assess  - aggravating factors: lifting, getting out of bed - Easing factors: positioning, heating pad, peppermint oil   PRECAUTIONS: None  WEIGHT BEARING RESTRICTIONS: No  FALLS:  Has patient fallen in last 6 months? No  LIVING ENVIRONMENT: Apartment - ~20 steps 1 rails, lives w/ girlfriend. 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  PLOF: Independent  PATIENT GOALS: get surgery, be more active post surgery, feel more confident with exercise  NEXT MD VISIT: June 11th  OBJECTIVE:   DIAGNOSTIC FINDINGS:  No recent imaging   PATIENT SURVEYS:  FOTO 34 current, 54 predicted  COGNITION: Overall cognitive status: Within functional limits for tasks assessed  SENSATION: Mildly diminished LT RUE compared to L, but intact   POSTURE: significant forward head/rounded shoulders, increased lordosis  PALPATION: deferred   CERVICAL ROM:   Active ROM A/PROM (deg) eval  Flexion   Extension   Right lateral flexion   Left lateral flexion   Right rotation   Left rotation    (Blank rows = not tested) Comments: cervical ROM WFL and painless  Lumbar rotation painless and grossly symmetrical  Lumbar flexion concordant, able to reach mid shin  UPPER EXTREMITY ROM:  Active ROM Right eval Left eval  Shoulder flexion    Shoulder abduction    Shoulder internal rotation    Shoulder external rotation    Elbow flexion    Elbow extension    Wrist flexion    Wrist extension     (Blank rows = not tested) Comments:Mild end range shoulder/back pain with flexion/abd bilat but grossly WFL   UPPER EXTREMITY MMT:  MMT Right eval Left eval  Shoulder flexion 3+ 3+  Shoulder extension    Shoulder abduction 3+ 3+  Shoulder extension    Shoulder internal rotation 4+ 4  Shoulder external rotation 4+ 4+  Elbow flexion    Elbow extension     Grip strength    (Blank rows = not tested)  (Key: WFL = within functional limits not formally assessed, * = concordant pain, s = stiffness/stretching sensation, NT = not tested)  Comments:    FUNCTIONAL TESTS:  5 times sit to stand: 11.26 sec gentle UE support no pain  TODAY'S TREATMENT:                                                                                                                              [facility] Adult PT Treatment:                                                 DATE:  10/03/22 NuStep L5 x 6 minutes Lower cerv/ UT stretch STM with percussion gun to UT, thoracic paraspinals due to reports of tightness and pain Standing 3 way shoulder elevation with 4# weights, 10 reps. Wall push ups x 10 Attempted push ups with arms on a table, but this caused wrist pain Attempted dips with B feet supported, but this also caused wrist pain, so deferred. Standing Palof press 10#, 2 x 10 each direction.  09/26/22 NuStep L4 x 6 minutes Strength training with weights-Shoulder ext and rows, palof press with 10#, 2 x 10 reps each  Goblet squats with 4# weight to 20" height, 2 x 10 reps Lat pulls, 20#, 2 x 10 Chest press, 10#, 2 x 10 reps Seated trunk extension against black Tband, 2 x 10 reps Stretching/dynamic mobilizations-single arm doorway stretch with traction and rotation, flexion over physioball, forward and to each side, wall angels.  09/19/22 NuStep L5 x 6 minutes Supine pelvic tilts, 8 x 10 seconds Seated on physiodisc, weight shifts, for/back, side to side, then clock pattern x 5 in each direction. Seated weight shifts in rotation with B hands on the mat to R, x 6, repeat to L. Thread the needle Standing shoulder ext, rows, ER against G tband, 2 x 10 reps each.  09/12/22 NuStep L5 x 6 minutes Lower trunk assessment, tight in ant tilt, but able to achieve PPT and hold. Tight hip flexors Clamshells with g tband 2 x 10 reps Attempted bridge, but it cause low back pain Min  squats x 10 Side to side step with G tband at knees x 10 Hip flexor stretch in sitting Cat/cow, de-emphasized cow due to excessive lordosis Forward flexion stretch over physioball  09/06/22 Therapeutic Exercise: Scapular retraction 1 x 12 with RTB, 1 x 12 with GTB Scapular retraction with double ER 2 x 12 with GTB Standing horizontal abduction 2 x 12 with GTB Supine pec stretch over 2 rolled towels 2 x 30 sec Dead bug 4 x 10 sec hold combined with sustained posterior pelvic tilt  - tactile cues for proper form Manual: Mtpr to the L rhomboid Taught how to use theracane and benefits of technique.    Updated HEP for horizontal abd, money exercise, dead bug, and pec stretch  [facility] Adult PT Treatment:                                                DATE: 08/28/22 Therapeutic Exercise: Scapular retraction x10 cues for posture Seated chin tuck x10 cues for form HEP handout + education  PATIENT EDUCATION:  Education details: Pt education on PT impairments, prognosis, and POC. Informed consent. Rationale for interventions, safe/appropriate HEP performance Person educated: Patient Education method: Explanation, Demonstration, Tactile cues, Verbal cues, and Handouts Education comprehension: verbalized understanding, returned demonstration, verbal cues required, tactile cues required, and needs further education    HOME EXERCISE PROGRAM: Access Code: [email or web address] URL: [email or web address] Date: 08/28/2022 Prepared by: Fransisco Hertz  Exercises - Seated Scapular Retraction  - 1 x daily - 7 x weekly - 3 sets - 10 reps - Seated Cervical Retraction  - 1 x daily - 7 x weekly - 3 sets - 10 reps  ASSESSMENT:  CLINICAL IMPRESSION: Patient reports that she awoke with severe Thoracic pain. The stretches did seem to help some. Continued to expand on trunk stabilization activities for long term pain relief. Also introduced massage gun to educate patient of its benefits for muscle pain and  tension, achieving acute pain relief.  Eval:Pt is a 32 year old woman who arrives to PT evaluation on this date for back pain in setting of mammary hypertrophy per referral. Pt reports difficulty with daily activities, lifting, and prolonged positioning due to pain. During today's session pt demonstrates functional but painful GH ROM, concordant pain with lumbar flexion, and generalized postural weakness. which are limiting ability to perform aforementioned activities.  Recommend skilled PT to address aforementioned deficits to improve functional independence/tolerance. Pt  departs today's session in no acute distress, all voiced questions/concerns addressed appropriately from PT perspective.    OBJECTIVE IMPAIRMENTS: decreased activity tolerance, decreased endurance, decreased mobility, decreased ROM, decreased strength, impaired perceived functional ability, improper body mechanics, postural dysfunction, and pain.   ACTIVITY LIMITATIONS: carrying, lifting, bending, sitting, standing, sleeping, transfers, bed mobility, and locomotion level  PARTICIPATION LIMITATIONS: meal prep, cleaning, laundry, driving, community activity, and occupation  PERSONAL FACTORS: Time since onset of injury/illness/exacerbation and 1 comorbidity: anxiety  are also affecting patient's functional outcome.   REHAB POTENTIAL: Good  CLINICAL DECISION MAKING: Stable/uncomplicated  EVALUATION COMPLEXITY: Low   GOALS: Goals reviewed with patient? No  SHORT TERM GOALS: Target date: 09/18/2022 Pt will demonstrate appropriate understanding and performance of initially prescribed HEP in order to facilitate improved independence with management of symptoms.  Baseline: HEP provided on eval Goal status: 09/12/22-met   2. Pt will score greater than or equal to 45 on FOTO in order to demonstrate improved perception of function due to symptoms.  Baseline: 34  Goal status: INITIAL    LONG TERM GOALS: Target date: 10/09/2022 Pt  will score 54 on FOTO in order to demonstrate improved perception of function due to symptoms. Baseline: 34 Goal status: INITIAL  2. Pt will report/demonstrate ability to sit for >1hr with less than 2pt increase in pain on NPS in order to improve tolerance to work activities.  Baseline: inc pain <21min sitting Goal status: INITIAL  3. Pt will demonstrate at least 4+/5 shoulder flexion/abduction MMT for improved symmetry of UE strength and improved tolerance to functional movements.  Baseline: see MMT chart above Goal status: INITIAL   4. Pt will report/demonstrate ability to lift up to 20# with less than 2 point increase on NPS in order to demonstrate improved tolerance to housework/community activities. Baseline: reports increased pain/difficulty lifting >15-20# Goal status: INITIAL   5. Pt will report reduction in neck pain-related headaches to no more than 1x/week in order to improve tolerance to functional activities.   Baseline: 1-3x/week reported  Goal status: INITIAL     PLAN:  PT FREQUENCY: 1x/week  PT DURATION: 6 weeks  PLANNED INTERVENTIONS: Therapeutic exercises, Therapeutic activity, Neuromuscular re-education, Balance training, Gait training, Patient/Family education, Self Care, Joint mobilization, Joint manipulation, Stair training, DME instructions, Aquatic Therapy, Dry Needling, Electrical stimulation, Spinal manipulation, Spinal mobilization, Cryotherapy, Moist heat, Taping, Manual therapy, and Re-evaluation  PLAN FOR NEXT SESSION: Progress ROM/strengthening exercises as able/appropriate, review HEP. Emphasis on postural strengthening and core stability  Oley Balm, PT/DPT

## 2022-10-03 NOTE — Progress Notes (Signed)
Chief Complaint:   OBESITY Paige Fritz is here to discuss her progress with her obesity treatment plan along with follow-up of her obesity related diagnoses. Paige Fritz is on the Category 2 Plan +100-calorie and keeping a food journal and adhering to recommended goals of 1200-1300 calories and 85+ protein and states she is following her eating plan approximately 75% of the time. Paige Fritz states she is walking, PT 30-60 minutes 7 times per week.  Today's visit was #: 8 Starting weight: 182 LBS Starting date: 06/21/2021 Today's weight: 213 LBS Today's date: 10/03/2022 Total lbs lost to date: 0 Total lbs lost since last in-office visit: +4 LBS  Interim History: Patient just had blood work done recently and found to be low in RBC, hemoglobin and hematocrit.  She has been closing the rings on her apple watch daily.  She is sleeping very poorly.  She is up from 3-4am almost daily.  Total protein per day average in the 60s. Next few weeks she is getting an iron infusion, PT weekly and repeat plastic surgery appointment on June 11th.  June 28th she is doing a procedure for her fibroids.  Previously discussed Qsymia with patient.  She is getting set up at lunch by 5 1 [place] review appointment. Reappoint 3 All okay loculated do it all at same time For a root canal.   Subjective:   1. Vitamin D deficiency Patient is on prescription Vitamin D.  Patient denies nausea, vomiting, muscle weakness, but is positive for fatigue.  2. Other disorder of eating PDMP checked controlled substance contract signed and discussed.  Starting weight of 213.8, needs to lose 10.5 LBS by end of September for 5% weight loss.  Assessment/Plan:   1. Vitamin D deficiency Refill- Vitamin D, Ergocalciferol, (DRISDOL) 1.25 MG (50000 UNIT) CAPS capsule; Take 1 capsule (50,000 Units total) by mouth every 7 (seven) days.  Dispense: 4 capsule; Refill: 0  2. Other disorder of eating Start - Phentermine HCl (LOMAIRA) 8 MG TABS;  Take 0.5 tablets (4 mg total) by mouth daily for 14 days, THEN 1 tablet (8 mg total) daily for 14 days.  Dispense: 23 tablet; Refill: 0  Start - topiramate (TOPAMAX) 25 MG tablet; Take 1 tablet (25 mg total) by mouth daily for 14 days, THEN 2 tablets (50 mg total) daily for 14 days.  Dispense: 46 tablet; Refill: 0  3. BMI 40.0-44.9, adult (HCC)  4. Obesity with starting BMI of 36.3 Paige Fritz is currently in the action stage of change. As such, her goal is to continue with weight loss efforts. She has agreed to the Category 2 Plan and keeping a food journal and adhering to recommended goals of 1200-1300 calories and 85+ protein daily.   Exercise goals: All adults should avoid inactivity. Some physical activity is better than none, and adults who participate in any amount of physical activity gain some health benefits.  Behavioral modification strategies: increasing lean protein intake, meal planning and cooking strategies, keeping healthy foods in the home, planning for success, and keeping a strict food journal.  Paige Fritz has agreed to follow-up with our clinic in 3-4 weeks. She was informed of the importance of frequent follow-up visits to maximize her success with intensive lifestyle modifications for her multiple health conditions.   Objective:   Blood pressure 129/85, pulse 91, temperature 98.5 F (36.9 C), height 5\' 1"  (1.549 m), weight 213 lb (96.6 kg), last menstrual period 09/27/2022, SpO2 100 %. Body mass index is 40.25 kg/m.  General: Cooperative,  alert, well developed, in no acute distress. HEENT: Conjunctivae and lids unremarkable. Cardiovascular: Regular rhythm.  Lungs: Normal work of breathing. Neurologic: No focal deficits.   Lab Results  Component Value Date   CREATININE 0.71 06/22/2022   BUN 11 06/22/2022   NA 137 06/22/2022   K 3.9 06/22/2022   CL 104 06/22/2022   CO2 28 06/22/2022   Lab Results  Component Value Date   ALT 8 06/22/2022   AST 13 (L) 06/22/2022    ALKPHOS 49 06/22/2022   BILITOT 0.3 06/22/2022   Lab Results  Component Value Date   HGBA1C 5.3 07/24/2022   HGBA1C 5.4 06/21/2021   Lab Results  Component Value Date   INSULIN 12.0 07/24/2022   INSULIN 10.7 06/21/2021   Lab Results  Component Value Date   TSH 3.390 06/21/2021   Lab Results  Component Value Date   CHOL 256 (H) 05/15/2022   HDL 65.90 05/15/2022   LDLCALC 175 (H) 05/15/2022   TRIG 75.0 05/15/2022   CHOLHDL 4 05/15/2022   Lab Results  Component Value Date   VD25OH 20.5 (L) 07/24/2022   VD25OH 34.01 09/06/2021   VD25OH 5.9 (L) 06/21/2021   Lab Results  Component Value Date   WBC 7.3 09/27/2022   HGB 10.7 (L) 09/27/2022   HCT 32.5 (L) 09/27/2022   MCV 90.5 09/27/2022   PLT 338 09/27/2022   Lab Results  Component Value Date   IRON 49 09/27/2022   TIBC 372 09/27/2022   FERRITIN 8 (L) 09/27/2022   Attestation Statements:   Reviewed by clinician on day of visit: allergies, medications, problem list, medical history, surgical history, family history, social history, and previous encounter notes.  I, Malcolm Metro, RMA, am acting as transcriptionist for Reuben Likes, MD.  I have reviewed the above documentation for accuracy and completeness, and I agree with the above. - Reuben Likes, MD

## 2022-10-06 ENCOUNTER — Inpatient Hospital Stay: Payer: 59 | Attending: Physician Assistant

## 2022-10-06 VITALS — BP 116/72 | HR 78 | Temp 98.1°F | Resp 20

## 2022-10-06 DIAGNOSIS — Z79899 Other long term (current) drug therapy: Secondary | ICD-10-CM | POA: Diagnosis not present

## 2022-10-06 DIAGNOSIS — D509 Iron deficiency anemia, unspecified: Secondary | ICD-10-CM | POA: Diagnosis not present

## 2022-10-06 DIAGNOSIS — D500 Iron deficiency anemia secondary to blood loss (chronic): Secondary | ICD-10-CM

## 2022-10-06 MED ORDER — SODIUM CHLORIDE 0.9 % IV SOLN: Freq: Once | INTRAVENOUS | Status: AC

## 2022-10-06 MED ORDER — SODIUM CHLORIDE 0.9 % IV SOLN
200.0000 mg | Freq: Once | INTRAVENOUS | Status: AC
  Administered 2022-10-06: 200 mg via INTRAVENOUS
  Filled 2022-10-06: qty 200

## 2022-10-06 NOTE — Patient Instructions (Signed)

## 2022-10-06 NOTE — Progress Notes (Signed)
Pt states she feels okay following Venofer infusion and does not want to stay for 30 min post observation period. VSS. Pt a/o and ambulates out of unit w/o complaint.

## 2022-10-09 ENCOUNTER — Ambulatory Visit: Payer: 59

## 2022-10-10 ENCOUNTER — Encounter: Payer: Self-pay | Admitting: Physical Therapy

## 2022-10-10 ENCOUNTER — Ambulatory Visit: Payer: 59 | Attending: Plastic Surgery | Admitting: Physical Therapy

## 2022-10-10 DIAGNOSIS — R293 Abnormal posture: Secondary | ICD-10-CM | POA: Diagnosis not present

## 2022-10-10 DIAGNOSIS — M546 Pain in thoracic spine: Secondary | ICD-10-CM | POA: Diagnosis not present

## 2022-10-10 DIAGNOSIS — M6281 Muscle weakness (generalized): Secondary | ICD-10-CM | POA: Diagnosis not present

## 2022-10-10 DIAGNOSIS — M5459 Other low back pain: Secondary | ICD-10-CM | POA: Diagnosis not present

## 2022-10-10 NOTE — Therapy (Signed)
OUTPATIENT PHYSICAL THERAPY TREATMENT  PHYSICAL THERAPY DISCHARGE SUMMARY  Visits from Start of Care: 7  Current functional level related to goals / functional outcomes: Decreased pain and headaches   Remaining deficits: Pain and headaches still present    Education / Equipment: HEP   Patient agrees to discharge. Patient goals were partially met. Patient is being discharged due to maximized rehab potential.    Patient Name: Paige Fritz MRN: [id number] DOB:08-Nov-1990, 32 y.o., female Today's Date: 10/10/2022  END OF SESSION:  PT End of Session - 10/10/22 1757     Visit Number 7    Date for PT Re-Evaluation 10/09/22    PT Start Time 1557    PT Stop Time 1635    PT Time Calculation (min) 38 min    Activity Tolerance Patient tolerated treatment well    Behavior During Therapy [facility] for tasks assessed/performed            Past Medical History:  Diagnosis Date   Anemia    fibroids.  iron infusion   Anxiety    Back pain    Headache(784.0)    UTI (urinary tract infection)    Past Surgical History:  Procedure Laterality Date   NO PAST SURGERIES     Patient Active Problem List   Diagnosis Date Noted   Symptomatic mammary hypertrophy 08/14/2022   Back pain 08/14/2022   Microcytic anemia 07/11/2021   Iron deficiency anemia 07/11/2021   Iron deficiency anemia due to chronic blood loss 07/11/2021    PCP: Jeani Sow, MD  REFERRING PROVIDER: Peggye Form, DO  REFERRING DIAG: N62 (ICD-10-CM) - Symptomatic mammary hypertrophy M54.6,G89.29 (ICD-10-CM) - Chronic bilateral thoracic back pain  THERAPY DIAG:  Pain in thoracic spine  Other low back pain  Abnormal posture  Muscle weakness (generalized)  Rationale for Evaluation and Treatment: Rehabilitation  ONSET DATE: several years ago   SUBJECTIVE:                                                                                                                                                                                                          SUBJECTIVE STATEMENT: Patient reports that her back pain is improved. She feels she is becoming more aware of her posture.  PERTINENT HISTORY:  anxiety  PAIN:  Are you having pain: 6/10 Location/description: low-mid back, up to neck Best-worst over past week: unable to assess  - aggravating factors: lifting, getting out of bed - Easing factors: positioning, heating pad, peppermint oil   PRECAUTIONS: None  WEIGHT BEARING RESTRICTIONS: No  FALLS:  Has patient fallen in last 6 months? No  LIVING ENVIRONMENT: Apartment - ~20 steps 1 rails, lives w/ girlfriend. 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  PLOF: Independent  PATIENT GOALS: get surgery, be more active post surgery, feel more confident with exercise  NEXT MD VISIT: June 11th  OBJECTIVE:   DIAGNOSTIC FINDINGS:  No recent imaging   PATIENT SURVEYS:  FOTO 34 current, 54 predicted  COGNITION: Overall cognitive status: Within functional limits for tasks assessed  SENSATION: Mildly diminished LT RUE compared to L, but intact   POSTURE: significant forward head/rounded shoulders, increased lordosis  PALPATION: deferred   CERVICAL ROM:   Active ROM A/PROM (deg) eval  Flexion   Extension   Right lateral flexion   Left lateral flexion   Right rotation   Left rotation    (Blank rows = not tested) Comments: cervical ROM WFL and painless  Lumbar rotation painless and grossly symmetrical  Lumbar flexion concordant, able to reach mid shin  UPPER EXTREMITY ROM:  Active ROM Right eval Left eval  Shoulder flexion    Shoulder abduction    Shoulder internal rotation    Shoulder external rotation    Elbow flexion    Elbow extension    Wrist flexion    Wrist extension     (Blank rows = not tested) Comments:Mild end range shoulder/back pain with  flexion/abd bilat but grossly WFL   UPPER EXTREMITY MMT:  MMT Right eval Left eval  Shoulder flexion 3+ 3+  Shoulder extension    Shoulder abduction 3+ 3+  Shoulder extension    Shoulder internal rotation 4+ 4  Shoulder external rotation 4+ 4+  Elbow flexion    Elbow extension    Grip strength    (Blank rows = not tested)  (Key: WFL = within functional limits not formally assessed, * = concordant pain, s = stiffness/stretching sensation, NT = not tested)  Comments:    FUNCTIONAL TESTS:  5 times sit to stand: 11.26 sec gentle UE support no pain  TODAY'S TREATMENT:                                                                                                                              [facility] Adult PT Treatment:                                                 DATE:  10/10/22 NuStep L4 x 6 minutes Re-assessed status for D/C-See goals STM to neck and R rhomboids, scapular mobs on R, stretch to R posterior capsule.  10/03/22 NuStep L5 x 6 minutes Lower cerv/ UT stretch STM with percussion gun to UT, thoracic paraspinals due to reports of tightness and pain  Standing 3 way shoulder elevation with 4# weights, 10 reps. Wall push ups x 10 Attempted push ups with arms on a table, but this caused wrist pain Attempted dips with B feet supported, but this also caused wrist pain, so deferred. Standing Palof press 10#, 2 x 10 each direction.  09/26/22 NuStep L4 x 6 minutes Strength training with weights-Shoulder ext and rows, palof press with 10#, 2 x 10 reps each Goblet squats with 4# weight to 20" height, 2 x 10 reps Lat pulls, 20#, 2 x 10 Chest press, 10#, 2 x 10 reps Seated trunk extension against black Tband, 2 x 10 reps Stretching/dynamic mobilizations-single arm doorway stretch with traction and rotation, flexion over physioball, forward and to each side, wall angels.  09/19/22 NuStep L5 x 6 minutes Supine pelvic tilts, 8 x 10 seconds Seated on physiodisc, weight shifts,  for/back, side to side, then clock pattern x 5 in each direction. Seated weight shifts in rotation with B hands on the mat to R, x 6, repeat to L. Thread the needle Standing shoulder ext, rows, ER against G tband, 2 x 10 reps each.  09/12/22 NuStep L5 x 6 minutes Lower trunk assessment, tight in ant tilt, but able to achieve PPT and hold. Tight hip flexors Clamshells with g tband 2 x 10 reps Attempted bridge, but it cause low back pain Min squats x 10 Side to side step with G tband at knees x 10 Hip flexor stretch in sitting Cat/cow, de-emphasized cow due to excessive lordosis Forward flexion stretch over physioball  09/06/22 Therapeutic Exercise: Scapular retraction 1 x 12 with RTB, 1 x 12 with GTB Scapular retraction with double ER 2 x 12 with GTB Standing horizontal abduction 2 x 12 with GTB Supine pec stretch over 2 rolled towels 2 x 30 sec Dead bug 4 x 10 sec hold combined with sustained posterior pelvic tilt  - tactile cues for proper form Manual: Mtpr to the L rhomboid Taught how to use theracane and benefits of technique.    Updated HEP for horizontal abd, money exercise, dead bug, and pec stretch  [facility] Adult PT Treatment:                                                DATE: 08/28/22 Therapeutic Exercise: Scapular retraction x10 cues for posture Seated chin tuck x10 cues for form HEP handout + education  PATIENT EDUCATION:  Education details: Pt education on PT impairments, prognosis, and POC. Informed consent. Rationale for interventions, safe/appropriate HEP performance Person educated: Patient Education method: Explanation, Demonstration, Tactile cues, Verbal cues, and Handouts Education comprehension: verbalized understanding, returned demonstration, verbal cues required, tactile cues required, and needs further education    HOME EXERCISE PROGRAM: Access Code: [email or web address] URL: [email or web address] Date: 08/28/2022 Prepared by: Fransisco Hertz  Exercises - Seated Scapular Retraction  - 1 x daily - 7 x weekly - 3 sets - 10 reps - Seated Cervical Retraction  - 1 x daily - 7 x weekly - 3 sets - 10 reps  ASSESSMENT:  CLINICAL IMPRESSION: Patient reports improved Thoracic pain, but it is still present. She feels that she is more aware of her posture now. Established extensive HEP to address strength and stretching/ROM for Thoracic and cervical spine, upper trunk to improve her stability and decrease back pain. Plan is to follow up with Dr regarding  next steps to address chronic back pain.  Eval:Pt is a 32 year old woman who arrives to PT evaluation on this date for back pain in setting of mammary hypertrophy per referral. Pt reports difficulty with daily activities, lifting, and prolonged positioning due to pain. During today's session pt demonstrates functional but painful GH ROM, concordant pain with lumbar flexion, and generalized postural weakness. which are limiting ability to perform aforementioned activities.  Recommend skilled PT to address aforementioned deficits to improve functional independence/tolerance. Pt departs today's session in no acute distress, all voiced questions/concerns addressed appropriately from PT perspective.    OBJECTIVE IMPAIRMENTS: decreased activity tolerance, decreased endurance, decreased mobility, decreased ROM, decreased strength, impaired perceived functional ability, improper body mechanics, postural dysfunction, and pain.   ACTIVITY LIMITATIONS: carrying, lifting, bending, sitting, standing, sleeping, transfers, bed mobility, and locomotion level  PARTICIPATION LIMITATIONS: meal prep, cleaning, laundry, driving, community activity, and occupation  PERSONAL FACTORS: Time since onset of injury/illness/exacerbation and 1 comorbidity: anxiety  are also affecting patient's functional outcome.   REHAB POTENTIAL: Good  CLINICAL DECISION MAKING: Stable/uncomplicated  EVALUATION COMPLEXITY:  Low   GOALS: Goals reviewed with patient? No  SHORT TERM GOALS: Target date: 09/18/2022 Pt will demonstrate appropriate understanding and performance of initially prescribed HEP in order to facilitate improved independence with management of symptoms.  Baseline: HEP provided on eval Goal status: 09/12/22-met   2. Pt will score greater than or equal to 45 on FOTO in order to demonstrate improved perception of function due to symptoms.  Baseline: 34  Goal status: 38.8, not met   LONG TERM GOALS: Target date: 10/09/2022 Pt will score 54 on FOTO in order to demonstrate improved perception of function due to symptoms. Baseline: 34 Goal status: 38.8 not met  2. Pt will report/demonstrate ability to sit for >1hr with less than 2pt increase in pain on NPS in order to improve tolerance to work activities.  Baseline: inc pain <95min sitting Goal status: not met  3. Pt will demonstrate at least 4+/5 shoulder flexion/abduction MMT for improved symmetry of UE strength and improved tolerance to functional movements.  Baseline: see MMT chart above Goal status: Shoulder flex 4-/5, limited by pain-not met   4. Pt will report/demonstrate ability to lift up to 20# with less than 2 point increase on NPS in order to demonstrate improved tolerance to housework/community activities. Baseline: reports increased pain/difficulty lifting >15-20# Goal status: Patient reports she is better able to follow the appropriate body mechanics with lifitng, but still having pain-not met   5. Pt will report reduction in neck pain-related headaches to no more than 1x/week in order to improve tolerance to functional activities.   Baseline: 1-3x/week reported  Goal status: Still present, 3x/week, not met     PLAN:  PT FREQUENCY: 1x/week  PT DURATION: 6 weeks  PLANNED INTERVENTIONS: Therapeutic exercises, Therapeutic activity, Neuromuscular re-education, Balance training, Gait training, Patient/Family education, Self  Care, Joint mobilization, Joint manipulation, Stair training, DME instructions, Aquatic Therapy, Dry Needling, Electrical stimulation, Spinal manipulation, Spinal mobilization, Cryotherapy, Moist heat, Taping, Manual therapy, and Re-evaluation  PLAN FOR NEXT SESSION: Progress ROM/strengthening exercises as able/appropriate, review HEP. Emphasis on postural strengthening and core stability  Oley Balm, PT/DPT

## 2022-10-13 ENCOUNTER — Inpatient Hospital Stay: Payer: 59

## 2022-10-13 VITALS — BP 129/75 | HR 95 | Temp 98.7°F | Resp 18

## 2022-10-13 DIAGNOSIS — Z79899 Other long term (current) drug therapy: Secondary | ICD-10-CM | POA: Diagnosis not present

## 2022-10-13 DIAGNOSIS — D509 Iron deficiency anemia, unspecified: Secondary | ICD-10-CM | POA: Diagnosis not present

## 2022-10-13 DIAGNOSIS — D500 Iron deficiency anemia secondary to blood loss (chronic): Secondary | ICD-10-CM

## 2022-10-13 MED ORDER — SODIUM CHLORIDE 0.9 % IV SOLN: Freq: Once | INTRAVENOUS | Status: AC

## 2022-10-13 MED ORDER — SODIUM CHLORIDE 0.9 % IV SOLN
200.0000 mg | Freq: Once | INTRAVENOUS | Status: AC
  Administered 2022-10-13: 200 mg via INTRAVENOUS
  Filled 2022-10-13: qty 200

## 2022-10-13 NOTE — Patient Instructions (Signed)

## 2022-10-13 NOTE — Progress Notes (Signed)
Patient tolerated infusion well, declined to stay 30 minute post infusion observation. VSS, discharged alert and ambulatory.

## 2022-10-16 ENCOUNTER — Ambulatory Visit: Payer: 59 | Admitting: Plastic Surgery

## 2022-10-16 ENCOUNTER — Encounter: Payer: Self-pay | Admitting: Plastic Surgery

## 2022-10-16 ENCOUNTER — Ambulatory Visit: Payer: 59

## 2022-10-16 VITALS — BP 116/84 | HR 92 | Ht 61.5 in | Wt 210.4 lb

## 2022-10-16 DIAGNOSIS — M542 Cervicalgia: Secondary | ICD-10-CM | POA: Diagnosis not present

## 2022-10-16 DIAGNOSIS — N62 Hypertrophy of breast: Secondary | ICD-10-CM | POA: Diagnosis not present

## 2022-10-16 DIAGNOSIS — M546 Pain in thoracic spine: Secondary | ICD-10-CM

## 2022-10-16 DIAGNOSIS — G8929 Other chronic pain: Secondary | ICD-10-CM

## 2022-10-16 NOTE — Progress Notes (Signed)
   Subjective:    Patient ID: Paige Fritz, female    DOB: 06-02-1990, 32 y.o.   MRN: [id number]  The patient is a 32 year old female here for further evaluation of her breasts.  She has complained of mammary hyperplasia with neck and back pain.  She has grooves in her shoulders due to her bra straps.  This is unrelieved with padding and supportive measures.  She has hyperpigmentation in her inframammary folds.  Her sternal notch to nipple distance on the right is 31 cm and on the left is 33 cm.  She is 5 feet 1 inch tall.  She weighs 210 pounds.  She has lost a few pounds since her last visit.  She is on medication and is seeing the healthy weight and wellness folks.  The patient states she has lost 8 pounds in the past couple of weeks.  She is planning on losing quite a bit more.  She finished physical therapy last week.  She does have a laser surgery for a uterine fibroid at the end of the month.   Review of Systems  Constitutional:  Positive for activity change.  HENT: Negative.    Eyes: Negative.   Respiratory: Negative.    Cardiovascular: Negative.   Gastrointestinal: Negative.   Genitourinary: Negative.   Musculoskeletal: Negative.        Objective:   Physical Exam Vitals and nursing note reviewed.  Constitutional:      Appearance: Normal appearance.  HENT:     Head: Normocephalic and atraumatic.  Cardiovascular:     Rate and Rhythm: Normal rate.     Pulses: Normal pulses.  Pulmonary:     Effort: Pulmonary effort is normal.  Abdominal:     Palpations: Abdomen is soft.  Skin:    General: Skin is warm.     Capillary Refill: Capillary refill takes less than 2 seconds.     Coloration: Skin is not jaundiced.     Findings: Bruising present.  Neurological:     Mental Status: She is alert and oriented to person, place, and time.  Psychiatric:        Mood and Affect: Mood normal.        Behavior: Behavior normal.        Thought Content: Thought content normal.         Judgment: Judgment normal.        Assessment & Plan:     ICD-10-CM   1. Symptomatic mammary hypertrophy  N62     2. Chronic bilateral thoracic back pain  M54.6    G89.29        The patient is highly motivated to lose weight.  She would like to see how she does over the next few months.  Will plan to see her back in 3 months for reevaluation and then we will rerun her numbers for the amount that needs to be removed and grams for insurance to cover this.

## 2022-10-19 ENCOUNTER — Inpatient Hospital Stay: Payer: 59

## 2022-10-19 VITALS — BP 118/72 | HR 86 | Temp 98.1°F | Resp 18

## 2022-10-19 DIAGNOSIS — D500 Iron deficiency anemia secondary to blood loss (chronic): Secondary | ICD-10-CM

## 2022-10-19 DIAGNOSIS — D509 Iron deficiency anemia, unspecified: Secondary | ICD-10-CM | POA: Diagnosis not present

## 2022-10-19 DIAGNOSIS — Z79899 Other long term (current) drug therapy: Secondary | ICD-10-CM | POA: Diagnosis not present

## 2022-10-19 MED ORDER — SODIUM CHLORIDE 0.9 % IV SOLN
200.0000 mg | Freq: Once | INTRAVENOUS | Status: AC
  Administered 2022-10-19: 200 mg via INTRAVENOUS
  Filled 2022-10-19: qty 200

## 2022-10-19 MED ORDER — SODIUM CHLORIDE 0.9 % IV SOLN: Freq: Once | INTRAVENOUS | Status: AC

## 2022-10-19 NOTE — Progress Notes (Signed)
Pt declined to stay for 30 minute post Venofer infusion observation. VS consistent with previous at time of discharge. Patient tolerated previous iron infusions well with mild fatigue noted. Pt ambulated independently to lobby for discharge.

## 2022-10-19 NOTE — Patient Instructions (Signed)

## 2022-10-20 ENCOUNTER — Inpatient Hospital Stay: Payer: 59

## 2022-10-23 ENCOUNTER — Encounter (INDEPENDENT_AMBULATORY_CARE_PROVIDER_SITE_OTHER): Payer: Self-pay | Admitting: Family Medicine

## 2022-10-23 ENCOUNTER — Other Ambulatory Visit: Payer: Self-pay | Admitting: Obstetrics and Gynecology

## 2022-10-23 ENCOUNTER — Ambulatory Visit: Payer: 59

## 2022-10-23 ENCOUNTER — Encounter (HOSPITAL_BASED_OUTPATIENT_CLINIC_OR_DEPARTMENT_OTHER): Payer: Self-pay | Admitting: Obstetrics and Gynecology

## 2022-10-23 NOTE — Progress Notes (Signed)
Spoke w/ via phone for pre-op interview--- pt Lab needs dos---- cbc, urine preg               Lab results------ no  COVID test -----patient states asymptomatic no test needed Arrive at ------- 1230 on 11-02-2022 NPO after MN NO Solid Food.  Clear liquids from MN until--- 1130 Med rec completed Medications to take morning of surgery ----- none Diabetic medication ----- n/a Patient instructed no nail polish to be worn day of surgery Patient instructed to bring photo id and insurance card day of surgery Patient aware to have Driver (ride ) / caregiver    for 24 hours after surgery -- friend, Investment banker, operational Patient Special Instructions ----- n/a Pre-Op special Instructions ----- case just added on today,  pre-orders pending second sign Patient verbalized understanding of instructions that were given at this phone interview. Patient denies shortness of breath, chest pain, fever, cough at this phone interview.

## 2022-10-27 ENCOUNTER — Inpatient Hospital Stay: Payer: 59

## 2022-10-27 ENCOUNTER — Telehealth: Payer: Self-pay

## 2022-10-27 VITALS — BP 139/81 | HR 99 | Temp 98.1°F | Resp 18

## 2022-10-27 DIAGNOSIS — D500 Iron deficiency anemia secondary to blood loss (chronic): Secondary | ICD-10-CM

## 2022-10-27 DIAGNOSIS — Z79899 Other long term (current) drug therapy: Secondary | ICD-10-CM | POA: Diagnosis not present

## 2022-10-27 DIAGNOSIS — D509 Iron deficiency anemia, unspecified: Secondary | ICD-10-CM | POA: Diagnosis not present

## 2022-10-27 MED ORDER — SODIUM CHLORIDE 0.9 % IV SOLN: Freq: Once | INTRAVENOUS | Status: AC

## 2022-10-27 MED ORDER — SODIUM CHLORIDE 0.9 % IV SOLN
200.0000 mg | Freq: Once | INTRAVENOUS | Status: AC
  Administered 2022-10-27: 200 mg via INTRAVENOUS
  Filled 2022-10-27: qty 200

## 2022-10-27 NOTE — Telephone Encounter (Addendum)
The patient reports feeling well after the Venofer infusion and the 30-minute post-observation period. Vital signs stable. Patient is alert and leaves the unit without any issues while walking.

## 2022-10-27 NOTE — Patient Instructions (Signed)

## 2022-10-30 ENCOUNTER — Ambulatory Visit: Payer: 59

## 2022-10-31 ENCOUNTER — Ambulatory Visit (INDEPENDENT_AMBULATORY_CARE_PROVIDER_SITE_OTHER): Payer: 59 | Admitting: Family Medicine

## 2022-10-31 ENCOUNTER — Encounter (INDEPENDENT_AMBULATORY_CARE_PROVIDER_SITE_OTHER): Payer: Self-pay | Admitting: Family Medicine

## 2022-10-31 VITALS — BP 122/79 | HR 85 | Temp 98.3°F | Ht 61.0 in | Wt 207.0 lb

## 2022-10-31 DIAGNOSIS — E669 Obesity, unspecified: Secondary | ICD-10-CM

## 2022-10-31 DIAGNOSIS — F5089 Other specified eating disorder: Secondary | ICD-10-CM | POA: Diagnosis not present

## 2022-10-31 DIAGNOSIS — E559 Vitamin D deficiency, unspecified: Secondary | ICD-10-CM

## 2022-10-31 DIAGNOSIS — Z6839 Body mass index (BMI) 39.0-39.9, adult: Secondary | ICD-10-CM

## 2022-10-31 MED ORDER — LOMAIRA 8 MG PO TABS
8.0000 mg | ORAL_TABLET | Freq: Every day | ORAL | 0 refills | Status: DC
Start: 2022-10-31 — End: 2022-12-31

## 2022-10-31 MED ORDER — VITAMIN D (ERGOCALCIFEROL) 1.25 MG (50000 UNIT) PO CAPS: 50000.0000 [IU] | ORAL_CAPSULE | ORAL | 0 refills | Status: DC

## 2022-10-31 MED ORDER — TOPIRAMATE 25 MG PO TABS
50.0000 mg | ORAL_TABLET | Freq: Every day | ORAL | 0 refills | Status: DC
Start: 2022-10-31 — End: 2022-12-31

## 2022-10-31 NOTE — Progress Notes (Signed)
Chief Complaint:   OBESITY Paige Fritz is here to discuss her progress with her obesity treatment plan along with follow-up of her obesity related diagnoses. Paige Fritz is on the Category 2 Plan and keeping a food journal and adhering to recommended goals of 1200-1300 calories and 85+ grams of protein and states she is following her eating plan approximately 80% of the time. Paige Fritz states she is at the gym 5 times per week, and working out and walking for 30-45 minutes 2-7 times per week.  Today's visit was #: 9 Starting weight: 182 lbs Starting date: 06/21/2021 Today's weight: 207 lbs Today's date: 10/31/2022 Total lbs lost to date: 0 Total lbs lost since last in-office visit: 6  Interim History: Patient has radio frequency ablation with sonata on 11/02/22.  Last time she was checked she has 5 fibroids that will be lasered.  She noticed her appetite has significantly decreased when she increased to the 8/50 dose.  She is nibbling more and eating less meals now. She stopped topiramate and lomaira due to upcoming surgery.  She felt better once starting her medication combination.  Subjective:   1. Vitamin D deficiency Patient is on prescription vitamin D.  Her last vitamin D level was 20.5.  2. Other disorder of eating Patient is on combination phentermine and topiramate.  PDMP was checked with no concerns.  Starting weight 213 pounds.  Assessment/Plan:   1. Vitamin D deficiency We will refill prescription vitamin D once weekly for 1 month.  - Vitamin D, Ergocalciferol, (DRISDOL) 1.25 MG (50000 UNIT) CAPS capsule; Take 1 capsule (50,000 Units total) by mouth every 7 (seven) days.  Dispense: 4 capsule; Refill: 0  2. Other disorder of eating Patient will continue her medications, and we will refill phentermine 8 mg, and topiramate 50 mg for 1 month.  - Phentermine HCl (LOMAIRA) 8 MG TABS; Take 1 tablet (8 mg total) by mouth daily.  Dispense: 30 tablet; Refill: 0 - topiramate  (TOPAMAX) 25 MG tablet; Take 2 tablets (50 mg total) by mouth daily.  Dispense: 60 tablet; Refill: 0  3. BMI 39.0-39.9,adult  4. Obesity with starting BMI of 36.3 Paige Fritz is currently in the action stage of change. As such, her goal is to continue with weight loss efforts. She has agreed to keeping a food journal and adhering to recommended goals of 1200-1300 calories and 85+ grams of protein daily.   Exercise goals: As is.   Behavioral modification strategies: increasing lean protein intake, meal planning and cooking strategies, keeping healthy foods in the home, planning for success, and keeping a strict food journal.  Paige Fritz has agreed to follow-up with our clinic in 4 to 5 weeks. She was informed of the importance of frequent follow-up visits to maximize her success with intensive lifestyle modifications for her multiple health conditions.   Objective:   Blood pressure 122/79, pulse 85, temperature 98.3 F (36.8 C), height 5\' 1"  (1.549 m), weight 207 lb (93.9 kg), last menstrual period 09/27/2022, SpO2 100 %. Body mass index is 39.11 kg/m.  General: Cooperative, alert, well developed, in no acute distress. HEENT: Conjunctivae and lids unremarkable. Cardiovascular: Regular rhythm.  Lungs: Normal work of breathing. Neurologic: No focal deficits.   Lab Results  Component Value Date   CREATININE 0.71 06/22/2022   BUN 11 06/22/2022   NA 137 06/22/2022   K 3.9 06/22/2022   CL 104 06/22/2022   CO2 28 06/22/2022   Lab Results  Component Value Date   ALT 8  06/22/2022   AST 13 (L) 06/22/2022   ALKPHOS 49 06/22/2022   BILITOT 0.3 06/22/2022   Lab Results  Component Value Date   HGBA1C 5.3 07/24/2022   HGBA1C 5.4 06/21/2021   Lab Results  Component Value Date   INSULIN 12.0 07/24/2022   INSULIN 10.7 06/21/2021   Lab Results  Component Value Date   TSH 3.390 06/21/2021   Lab Results  Component Value Date   CHOL 256 (H) 05/15/2022   HDL 65.90 05/15/2022   LDLCALC  175 (H) 05/15/2022   TRIG 75.0 05/15/2022   CHOLHDL 4 05/15/2022   Lab Results  Component Value Date   VD25OH 20.5 (L) 07/24/2022   VD25OH 34.01 09/06/2021   VD25OH 5.9 (L) 06/21/2021   Lab Results  Component Value Date   WBC 7.3 09/27/2022   HGB 10.7 (L) 09/27/2022   HCT 32.5 (L) 09/27/2022   MCV 90.5 09/27/2022   PLT 338 09/27/2022   Lab Results  Component Value Date   IRON 49 09/27/2022   TIBC 372 09/27/2022   FERRITIN 8 (L) 09/27/2022   Attestation Statements:   Reviewed by clinician on day of visit: allergies, medications, problem list, medical history, surgical history, family history, social history, and previous encounter notes.   I, Burt Knack, am acting as transcriptionist for Reuben Likes, MD.  I have reviewed the above documentation for accuracy and completeness, and I agree with the above. - Reuben Likes, MD

## 2022-11-01 NOTE — Anesthesia Preprocedure Evaluation (Addendum)
Anesthesia Evaluation  Patient identified by MRN, date of birth, ID band Patient awake    Reviewed: Allergy & Precautions, NPO status , Patient's Chart, lab work & pertinent test results  Airway Mallampati: II  TM Distance: >3 FB Neck ROM: Full    Dental no notable dental hx. (+) Teeth Intact, Dental Advisory Given   Pulmonary    Pulmonary exam normal breath sounds clear to auscultation       Cardiovascular negative cardio ROS Normal cardiovascular exam Rhythm:Regular Rate:Normal     Neuro/Psych  Headaches  Anxiety        GI/Hepatic Neg liver ROS,,,  Endo/Other  negative endocrine ROS    Renal/GU      Musculoskeletal   Abdominal  (+) + obese (BMI 39.1)  Peds  Hematology  (+) Blood dyscrasia, anemia Lab Results      Component                Value               Date                        HGB                      10.7 (L)            09/27/2022                   Anesthesia Other Findings   Reproductive/Obstetrics negative OB ROS                              Anesthesia Physical Anesthesia Plan  ASA: 2  Anesthesia Plan: General   Post-op Pain Management: Toradol IV (intra-op)*, Tylenol PO (pre-op)* and Precedex   Induction: Inhalational  PONV Risk Score and Plan: Midazolam, Treatment may vary due to age or medical condition, Ondansetron and Dexamethasone  Airway Management Planned: LMA  Additional Equipment: None  Intra-op Plan:   Post-operative Plan:   Informed Consent: I have reviewed the patients History and Physical, chart, labs and discussed the procedure including the risks, benefits and alternatives for the proposed anesthesia with the patient or authorized representative who has indicated his/her understanding and acceptance.     Dental advisory given  Plan Discussed with:   Anesthesia Plan Comments:          Anesthesia Quick Evaluation

## 2022-11-01 NOTE — Progress Notes (Signed)
Left voice mail for patient to return call to verify arrival time 

## 2022-11-01 NOTE — Progress Notes (Signed)
Patient returned call . To come in at 0530 for 0730 start case. Clear liquids until 0430

## 2022-11-02 ENCOUNTER — Encounter (HOSPITAL_BASED_OUTPATIENT_CLINIC_OR_DEPARTMENT_OTHER)
Admission: RE | Disposition: A | Discharge status: Home / Self Care | Payer: Self-pay | Source: Home / Self Care | Attending: Obstetrics and Gynecology

## 2022-11-02 ENCOUNTER — Encounter (HOSPITAL_BASED_OUTPATIENT_CLINIC_OR_DEPARTMENT_OTHER): Payer: Self-pay | Admitting: Obstetrics and Gynecology

## 2022-11-02 ENCOUNTER — Other Ambulatory Visit: Payer: Self-pay

## 2022-11-02 ENCOUNTER — Ambulatory Visit (HOSPITAL_BASED_OUTPATIENT_CLINIC_OR_DEPARTMENT_OTHER): Payer: 59 | Admitting: Certified Registered Nurse Anesthetist

## 2022-11-02 ENCOUNTER — Ambulatory Visit (HOSPITAL_BASED_OUTPATIENT_CLINIC_OR_DEPARTMENT_OTHER)
Admission: RE | Admit: 2022-11-02 | Discharge: 2022-11-02 | Disposition: A | Discharge status: Home / Self Care | Payer: 59 | Attending: Obstetrics and Gynecology | Admitting: Obstetrics and Gynecology

## 2022-11-02 DIAGNOSIS — G8929 Other chronic pain: Secondary | ICD-10-CM | POA: Insufficient documentation

## 2022-11-02 DIAGNOSIS — D252 Subserosal leiomyoma of uterus: Secondary | ICD-10-CM | POA: Insufficient documentation

## 2022-11-02 DIAGNOSIS — M549 Dorsalgia, unspecified: Secondary | ICD-10-CM | POA: Insufficient documentation

## 2022-11-02 DIAGNOSIS — D251 Intramural leiomyoma of uterus: Secondary | ICD-10-CM | POA: Diagnosis not present

## 2022-11-02 DIAGNOSIS — Z6839 Body mass index (BMI) 39.0-39.9, adult: Secondary | ICD-10-CM | POA: Insufficient documentation

## 2022-11-02 DIAGNOSIS — Z01818 Encounter for other preprocedural examination: Secondary | ICD-10-CM

## 2022-11-02 DIAGNOSIS — N840 Polyp of corpus uteri: Secondary | ICD-10-CM | POA: Insufficient documentation

## 2022-11-02 DIAGNOSIS — N920 Excessive and frequent menstruation with regular cycle: Secondary | ICD-10-CM | POA: Diagnosis not present

## 2022-11-02 DIAGNOSIS — F419 Anxiety disorder, unspecified: Secondary | ICD-10-CM | POA: Diagnosis not present

## 2022-11-02 DIAGNOSIS — D259 Leiomyoma of uterus, unspecified: Secondary | ICD-10-CM | POA: Diagnosis not present

## 2022-11-02 DIAGNOSIS — E669 Obesity, unspecified: Secondary | ICD-10-CM | POA: Diagnosis not present

## 2022-11-02 HISTORY — DX: Excessive and frequent menstruation with regular cycle: N92.0

## 2022-11-02 HISTORY — DX: Vitamin D deficiency, unspecified: E55.9

## 2022-11-02 HISTORY — DX: Intramural leiomyoma of uterus: D25.1

## 2022-11-02 HISTORY — DX: Other chronic pain: G89.29

## 2022-11-02 HISTORY — DX: Headache, unspecified: R51.9

## 2022-11-02 HISTORY — PX: HYSTEROSCOPY WITH D & C: SHX1775

## 2022-11-02 LAB — CBC
HCT: 37.2 % (ref 36.0–46.0)
Hemoglobin: 12 g/dL (ref 12.0–15.0)
MCH: 30.2 pg (ref 26.0–34.0)
MCHC: 32.3 g/dL (ref 30.0–36.0)
MCV: 93.5 fL (ref 80.0–100.0)
Platelets: 316 10*3/uL (ref 150–400)
RBC: 3.98 MIL/uL (ref 3.87–5.11)
RDW: 15 % (ref 11.5–15.5)
WBC: 5.5 10*3/uL (ref 4.0–10.5)
nRBC: 0 % (ref 0.0–0.2)

## 2022-11-02 LAB — POCT PREGNANCY, URINE: Preg Test, Ur: NEGATIVE

## 2022-11-02 SURGERY — RADIOFREQUENCY ABLATION, LEIOMYOMA, UTERUS, TRANSCERVICAL APPROACH, WITH US GUIDANCE
Anesthesia: General | Site: Uterus

## 2022-11-02 MED ORDER — PROPOFOL 10 MG/ML IV BOLUS
INTRAVENOUS | Status: AC
  Filled 2022-11-02: qty 20

## 2022-11-02 MED ORDER — OXYCODONE HCL 5 MG PO TABS: 5.0000 mg | ORAL_TABLET | Freq: Once | ORAL | Status: DC | PRN

## 2022-11-02 MED ORDER — DEXMEDETOMIDINE HCL IN NACL 80 MCG/20ML IV SOLN
INTRAVENOUS | Status: DC | PRN
  Administered 2022-11-02: 12 ug via INTRAVENOUS

## 2022-11-02 MED ORDER — OXYCODONE HCL 5 MG/5ML PO SOLN: 5.0000 mg | Freq: Once | ORAL | Status: DC | PRN

## 2022-11-02 MED ORDER — PHENYLEPHRINE 80 MCG/ML (10ML) SYRINGE FOR IV PUSH (FOR BLOOD PRESSURE SUPPORT)
PREFILLED_SYRINGE | INTRAVENOUS | Status: DC | PRN
  Administered 2022-11-02 (×2): 80 ug via INTRAVENOUS

## 2022-11-02 MED ORDER — SODIUM CHLORIDE 0.9 % IR SOLN
Status: DC | PRN
  Administered 2022-11-02: 3000 mL

## 2022-11-02 MED ORDER — FENTANYL CITRATE (PF) 250 MCG/5ML IJ SOLN
INTRAMUSCULAR | Status: DC | PRN
  Administered 2022-11-02: 25 ug via INTRAVENOUS
  Administered 2022-11-02: 50 ug via INTRAVENOUS
  Administered 2022-11-02 (×3): 25 ug via INTRAVENOUS

## 2022-11-02 MED ORDER — MIDAZOLAM HCL 2 MG/2ML IJ SOLN
INTRAMUSCULAR | Status: DC | PRN
  Administered 2022-11-02: 2 mg via INTRAVENOUS

## 2022-11-02 MED ORDER — FENTANYL CITRATE (PF) 100 MCG/2ML IJ SOLN
INTRAMUSCULAR | Status: AC
  Filled 2022-11-02: qty 2

## 2022-11-02 MED ORDER — POVIDONE-IODINE 10 % EX SWAB: 2.0000 | Freq: Once | CUTANEOUS | Status: DC

## 2022-11-02 MED ORDER — HYDROMORPHONE HCL 1 MG/ML IJ SOLN
INTRAMUSCULAR | Status: AC
  Filled 2022-11-02: qty 1

## 2022-11-02 MED ORDER — LIDOCAINE 2% (20 MG/ML) 5 ML SYRINGE
INTRAMUSCULAR | Status: DC | PRN
  Administered 2022-11-02: 100 mg via INTRAVENOUS

## 2022-11-02 MED ORDER — ACETAMINOPHEN 500 MG PO TABS
1000.0000 mg | ORAL_TABLET | Freq: Once | ORAL | Status: AC
  Administered 2022-11-02: 1000 mg via ORAL

## 2022-11-02 MED ORDER — ONDANSETRON HCL 4 MG/2ML IJ SOLN
INTRAMUSCULAR | Status: DC | PRN
  Administered 2022-11-02: 4 mg via INTRAVENOUS

## 2022-11-02 MED ORDER — OXYCODONE-ACETAMINOPHEN 5-325 MG PO TABS: 1.0000 | ORAL_TABLET | Freq: Four times a day (QID) | ORAL | 0 refills | Status: AC | PRN

## 2022-11-02 MED ORDER — MIDAZOLAM HCL 2 MG/2ML IJ SOLN
INTRAMUSCULAR | Status: AC
  Filled 2022-11-02: qty 2

## 2022-11-02 MED ORDER — ACETAMINOPHEN 500 MG PO TABS
ORAL_TABLET | ORAL | Status: AC
  Filled 2022-11-02: qty 2

## 2022-11-02 MED ORDER — STERILE WATER FOR IRRIGATION IR SOLN
Status: DC | PRN
  Administered 2022-11-02: 500 mL

## 2022-11-02 MED ORDER — ONDANSETRON HCL 4 MG/2ML IJ SOLN: 4.0000 mg | Freq: Once | INTRAMUSCULAR | Status: DC | PRN

## 2022-11-02 MED ORDER — PROPOFOL 10 MG/ML IV BOLUS
INTRAVENOUS | Status: DC | PRN
  Administered 2022-11-02: 170 mg via INTRAVENOUS

## 2022-11-02 MED ORDER — HYDROMORPHONE HCL 1 MG/ML IJ SOLN
0.2500 mg | INTRAMUSCULAR | Status: DC | PRN
  Administered 2022-11-02: 0.25 mg via INTRAVENOUS
  Administered 2022-11-02 (×2): 0.5 mg via INTRAVENOUS
  Administered 2022-11-02: 0.25 mg via INTRAVENOUS

## 2022-11-02 MED ORDER — DEXAMETHASONE SODIUM PHOSPHATE 10 MG/ML IJ SOLN
INTRAMUSCULAR | Status: DC | PRN
  Administered 2022-11-02: 10 mg via INTRAVENOUS

## 2022-11-02 MED ORDER — KETOROLAC TROMETHAMINE 30 MG/ML IJ SOLN
INTRAMUSCULAR | Status: DC | PRN
  Administered 2022-11-02: 30 mg via INTRAVENOUS

## 2022-11-02 MED ORDER — LACTATED RINGERS IV SOLN: INTRAVENOUS | Status: DC

## 2022-11-02 MED ORDER — KETOROLAC TROMETHAMINE 30 MG/ML IJ SOLN: 30.0000 mg | Freq: Once | INTRAMUSCULAR | Status: DC | PRN

## 2022-11-02 MED FILL — Iron Sucrose Inj 20 MG/ML (Fe Equiv): INTRAVENOUS | Qty: 10 | Status: AC

## 2022-11-02 SURGICAL SUPPLY — 21 items
DEVICE MYOSURE REACH (MISCELLANEOUS) ×1 IMPLANT
ELECT DISPERSIVE SONATA (ELECTRODE) ×6 IMPLANT
GAUZE 4X4 16PLY ~~LOC~~+RFID DBL (SPONGE) ×3 IMPLANT
GLOVE BIOGEL PI IND STRL 7.0 (GLOVE) ×3 IMPLANT
GLOVE ECLIPSE 6.5 STRL STRAW (GLOVE) ×3 IMPLANT
GLOVE SURG SS PI 7.5 STRL IVOR (GLOVE) ×1 IMPLANT
GOWN STRL REUS W/TWL LRG LVL3 (GOWN DISPOSABLE) ×4 IMPLANT
HANDPIECE RFA SONATA (MISCELLANEOUS) ×3 IMPLANT
IV NS IRRIG 3000ML ARTHROMATIC (IV SOLUTION) ×3 IMPLANT
KIT PROCEDURE FLUENT (KITS) ×3 IMPLANT
KIT TURNOVER CYSTO (KITS) ×3 IMPLANT
PACK VAGINAL MINOR WOMEN LF (CUSTOM PROCEDURE TRAY) ×3 IMPLANT
PAD OB MATERNITY 4.3X12.25 (PERSONAL CARE ITEMS) ×3 IMPLANT
PAD PREP 24X48 CUFFED NSTRL (MISCELLANEOUS) ×3 IMPLANT
SEAL CERVICAL OMNI LOK (ABLATOR) ×1 IMPLANT
SEAL ROD LENS SCOPE MYOSURE (ABLATOR) ×3 IMPLANT
SLEEVE SCD COMPRESS KNEE MED (STOCKING) ×3 IMPLANT
SOL PREP POV-IOD 4OZ 10% (MISCELLANEOUS) ×1 IMPLANT
SYR 50ML LL SCALE MARK (SYRINGE) ×3 IMPLANT
TOWEL OR 17X24 6PK STRL BLUE (TOWEL DISPOSABLE) ×3 IMPLANT
WATER STERILE IRR 500ML POUR (IV SOLUTION) ×3 IMPLANT

## 2022-11-02 NOTE — Discharge Instructions (Addendum)
DISCHARGE INSTRUCTIONS: HYSTEROSCOPY / ENDOMETRIAL ABLATION The following instructions have been prepared to help you care for yourself upon your return home.  May take stool softner while taking narcotic pain medication to prevent constipation.  Drink plenty of water.  Personal hygiene:  Use sanitary pads for vaginal drainage, not tampons.  Shower the day after your procedure.  NO tub baths, pools or Jacuzzis for 2-3 weeks.  Wipe front to back after using the bathroom.  Activity and limitations:  Do NOT drive or operate any equipment for 24 hours. The effects of anesthesia are still present and drowsiness may result.  Do NOT rest in bed all day.  Walking is encouraged.  Walk up and down stairs slowly.  You may resume your normal activity in one to two days or as indicated by your physician. Sexual activity: NO intercourse for at least 2 weeks after the procedure, or as indicated by your Doctor.  Diet: Eat a light meal as desired this evening. You may resume your usual diet tomorrow.  Return to Work: You may resume your work activities in one to two days or as indicated by Therapist, sports.  What to expect after your surgery: Expect to have vaginal bleeding/discharge for 2-3 days and spotting for up to 10 days. It is not unusual to have soreness for up to 1-2 weeks. You may have a slight burning sensation when you urinate for the first day. Mild cramps may continue for a couple of days. You may have a regular period in 2-6 weeks.  Call your doctor for any of the following:  Excessive vaginal bleeding or clotting, saturating and changing one pad every hour.  Inability to urinate 6 hours after discharge from hospital.  Pain not relieved by pain medication.  Fever of 100.4 F or greater.  Unusual vaginal discharge or odor.   Post Anesthesia Home Care Instructions  Activity: Get plenty of rest for the remainder of the day. A responsible individual must stay with you for 24 hours  following the procedure.  For the next 24 hours, DO NOT: -Drive a car -Advertising copywriter -Drink alcoholic beverages -Take any medication unless instructed by your physician -Make any legal decisions or sign important papers.  Meals: Start with liquid foods such as gelatin or soup. Progress to regular foods as tolerated. Avoid greasy, spicy, heavy foods. If nausea and/or vomiting occur, drink only clear liquids until the nausea and/or vomiting subsides. Call your physician if vomiting continues.  Special Instructions/Symptoms: Your throat may feel dry or sore from the anesthesia or the breathing tube placed in your throat during surgery. If this causes discomfort, gargle with warm salt water. The discomfort should disappear within 24 hours.  If you had a scopolamine patch placed behind your ear for the management of post- operative nausea and/or vomiting:  1. The medication in the patch is effective for 72 hours, after which it should be removed.  Wrap patch in a tissue and discard in the trash. Wash hands thoroughly with soap and water. 2. You may remove the patch earlier than 72 hours if you experience unpleasant side effects which may include dry mouth, dizziness or visual disturbances. 3. Avoid touching the patch. Wash your hands with soap and water after contact with the patch.   No ibuprofen, Advil, Aleve, Motrin, ketorolac, meloxicam, naproxen, or other NSAIDS until after 2:20 pm today if needed. No acetaminophen/Tylenol until after 12:45 pm today if needed.

## 2022-11-02 NOTE — Brief Op Note (Signed)
11/02/2022  9:43 AM  PATIENT:  Paige Fritz  32 y.o. female  PRE-OPERATIVE DIAGNOSIS:  intramural and subserous leiomyoma, Menorrhagia with regular cycle  POST-OPERATIVE DIAGNOSIS:  intramural and subserous leiomyoma, Menorrhagia with regular cycle, endometrial polyps  PROCEDURE:  Radiofrequency transcervical ablation of uterine fibroids using Sonata, diagnostic hysteroscopy, hysteroscopic resection of endometrial polyps using myosure, dilation and curettage  SURGEON:  Surgeon(s) and Role:    * Maxie Better, MD - Primary  PHYSICIAN ASSISTANT:   ASSISTANTS: none   ANESTHESIA:   general Findings: multiple uterine fibroids, adenomyosis, endometrial polyps, tubal ostias seen EBL:  5 mL   BLOOD ADMINISTERED:none  DRAINS: none   LOCAL MEDICATIONS USED:  NONE  SPECIMEN:  Source of Specimen:  EMC with polyps  DISPOSITION OF SPECIMEN:  PATHOLOGY  COUNTS:  YES  TOURNIQUET:  * No tourniquets in log *  DICTATION: .Other Dictation: Dictation Number [id number]  PLAN OF CARE: Discharge to home after PACU  PATIENT DISPOSITION:  PACU - hemodynamically stable.   Delay start of Pharmacological VTE agent (>24hrs) due to surgical blood loss or risk of bleeding: no

## 2022-11-02 NOTE — H&P (Signed)
Paige Fritz is an 32 y.o. female. G1P0010 SF presents for surgical management of symptomatic uterine fibroids using Sonata  Pertinent Gynecological History: Menses: flow is excessive with use of 5 pads or tampons on heaviest days Bleeding: menorrhagia Contraception: none DES exposure: denies Blood transfusions: none Sexually transmitted diseases: hpv Previous GYN Procedures:  n/a   Last mammogram:  n/a  Date: n/a Last pap: normal Date: 2023 OB History: G1, P0010   Menstrual History: Menarche age: n/a Patient's last menstrual period was 09/27/2022 (exact date).    Past Medical History:  Diagnosis Date   Anxiety    Chronic back pain    per pt due to large breast   Chronic headaches    per pt due to back pain   Intramural and subserous leiomyoma of uterus    Iron deficiency anemia due to chronic blood loss 2015   hematology/ oncologist--- David Stall PA;   treated w/ iron infusions   Menorrhagia with regular cycle    Vitamin D deficiency     Past Surgical History:  Procedure Laterality Date   NO PAST SURGERIES     WISDOM TOOTH EXTRACTION  2019    Family History  Problem Relation Age of Onset   Obesity Mother    Hypertension Father     Social History:  reports that she has never smoked. She has never used smokeless tobacco. She reports current alcohol use. She reports that she does not use drugs.  Allergies: No Known Allergies  No medications prior to admission.    Review of Systems  All other systems reviewed and are negative.   Height 5' 1.5" (1.562 m), weight 93.9 kg, last menstrual period 09/27/2022. Physical Exam Constitutional:      Appearance: Normal appearance.  HENT:     Head: Atraumatic.  Eyes:     Extraocular Movements: Extraocular movements intact.  Cardiovascular:     Rate and Rhythm: Regular rhythm.     Heart sounds: Normal heart sounds.  Pulmonary:     Breath sounds: Normal breath sounds.  Abdominal:     Palpations: Abdomen  is soft.  Genitourinary:    General: Normal vulva.     Comments: Vagina nl lesion Cervix nl Adnexa nl  Uterus 10 wk irreg Musculoskeletal:        General: Normal range of motion.     Cervical back: Neck supple.  Skin:    General: Skin is warm and dry.  Neurological:     Mental Status: She is alert.  Psychiatric:        Mood and Affect: Mood normal.        Behavior: Behavior normal.     No results found for this or any previous visit (from the past 24 hour(s)).  No results found.  Assessment/Plan: Symptomatic uterine fibroid P) Radiofrequency transcervical ablation of uterine fibroids using Sonata, dilation and curettage, diagnostic hysteroscopy, hysteroscopic resection. Procedure explained . Risk of surgery reviewed including infection, bleeding, inability to treat all fibroids due to location , uterine perforation and its risk, thermal injury, fluid overload and its mgmt . All ? answered   Francoise Chojnowski A Merie Wulf 11/02/2022, 3:22 AM

## 2022-11-02 NOTE — Transfer of Care (Signed)
Immediate Anesthesia Transfer of Care Note  Patient: Paige Fritz  Procedure(s) Performed: Radio Frequency Ablation with Kathaleen Bury (Uterus) DILATATION AND CURETTAGE /HYSTEROSCOPY  Patient Location: PACU  Anesthesia Type:General  Level of Consciousness: drowsy and patient cooperative  Airway & Oxygen Therapy: Patient Spontanous Breathing  Post-op Assessment: Report given to RN and Post -op Vital signs reviewed and stable  Post vital signs: Reviewed and stable  Last Vitals:  Vitals Value Taken Time  BP 105/58 11/02/22 0849  Temp    Pulse 78 11/02/22 0848  Resp 21 11/02/22 0850  SpO2 100 % 11/02/22 0848  Vitals shown include unvalidated device data.  Last Pain:  Vitals:   11/02/22 0603  TempSrc: Oral  PainSc: 3       Patients Stated Pain Goal: 3 (11/02/22 0603)  Complications: No notable events documented.

## 2022-11-02 NOTE — Anesthesia Postprocedure Evaluation (Signed)
Anesthesia Post Note  Patient: Paige Fritz  Procedure(s) Performed: Radio Frequency Ablation with Sonata (Uterus) DILATATION AND CURETTAGE /HYSTEROSCOPY     Patient location during evaluation: PACU Anesthesia Type: General Level of consciousness: awake and alert Pain management: pain level controlled Vital Signs Assessment: post-procedure vital signs reviewed and stable Respiratory status: spontaneous breathing, nonlabored ventilation, respiratory function stable and patient connected to nasal cannula oxygen Cardiovascular status: blood pressure returned to baseline and stable Postop Assessment: no apparent nausea or vomiting Anesthetic complications: no   No notable events documented.  Last Vitals:  Vitals:   11/02/22 0945 11/02/22 1000  BP: 118/71 115/72  Pulse: 75 70  Resp: 19 19  Temp:  36.8 C  SpO2: 98% 96%    Last Pain:  Vitals:   11/02/22 1000  TempSrc:   PainSc: 5                  Trevor Iha

## 2022-11-02 NOTE — Anesthesia Procedure Notes (Signed)
Procedure Name: LMA Insertion Date/Time: 11/02/2022 7:45 AM  Performed by: Dairl Ponder, CRNAPre-anesthesia Checklist: Patient identified, Emergency Drugs available, Suction available and Patient being monitored Patient Re-evaluated:Patient Re-evaluated prior to induction Oxygen Delivery Method: Circle System Utilized Preoxygenation: Pre-oxygenation with 100% oxygen Induction Type: IV induction Ventilation: Mask ventilation without difficulty LMA: LMA inserted LMA Size: 4.0 Number of attempts: 1 Airway Equipment and Method: Bite block Placement Confirmation: positive ETCO2 Tube secured with: Tape Dental Injury: Teeth and Oropharynx as per pre-operative assessment

## 2022-11-03 ENCOUNTER — Inpatient Hospital Stay: Payer: 59

## 2022-11-03 VITALS — BP 116/60 | HR 87 | Temp 97.9°F | Resp 20

## 2022-11-03 DIAGNOSIS — D500 Iron deficiency anemia secondary to blood loss (chronic): Secondary | ICD-10-CM

## 2022-11-03 DIAGNOSIS — D509 Iron deficiency anemia, unspecified: Secondary | ICD-10-CM | POA: Diagnosis not present

## 2022-11-03 DIAGNOSIS — Z79899 Other long term (current) drug therapy: Secondary | ICD-10-CM | POA: Diagnosis not present

## 2022-11-03 MED ORDER — SODIUM CHLORIDE 0.9 % IV SOLN
200.0000 mg | Freq: Once | INTRAVENOUS | Status: AC
  Administered 2022-11-03: 200 mg via INTRAVENOUS
  Filled 2022-11-03: qty 200

## 2022-11-03 MED ORDER — SODIUM CHLORIDE 0.9 % IV SOLN: Freq: Once | INTRAVENOUS | Status: AC

## 2022-11-03 NOTE — Progress Notes (Signed)
Venofer complete. Pt declines d/c instruction and does not want to wait for 30 mins post obs period.

## 2022-11-03 NOTE — Patient Instructions (Signed)

## 2022-11-04 NOTE — Op Note (Unsigned)
NAME: Paige Fritz, Paige Fritz MEDICAL RECORD NO: [id number] ACCOUNT NO: [id number] DATE OF BIRTH: 12/31/1990 FACILITY: [facility] LOCATION: [facility] PHYSICIAN: Azrielle Springsteen A. Cherly Hensen, MD  Operative Report   DATE OF PROCEDURE: 11/02/2022  PREOPERATIVE DIAGNOSES:  Menorrhagia with regular cycles, uterine fibroids.  PROCEDURE:  Radiofrequency transcervical uterine ablation of uterine fibroids using Sonata, diagnostic hysteroscopy, hysteroscopic resection of endometrial polyps, dilation and curettage.  POSTOPERATIVE DIAGNOSES:  Intramural subserosal uterine fibroids, menorrhagia and endometrial polyp.  ANESTHESIA:  General.  SURGEON:  Marcy Sookdeo A. Cherly Hensen, MD.  ASSISTANT:  None.  DESCRIPTION OF PROCEDURE:  Under adequate general anesthesia, the patient was placed in the dorsal lithotomy position.  Dispersive electrode pads were placed on the anterior thighs of the patient.  The patient had voided prior to entering the room and  therefore was not catheterized.  A bivalve speculum was placed in the vagina.  A single-tooth tenaculum was placed on the anterior lip of the cervix.  The cervix was then dilated up to #23 [facility] dilator.  A diagnostic hysteroscope was introduced into the  uterine cavity.  Both tubal ostia could be seen.  Multiple polypoid lesions were noted.  The hysteroscope was removed.  The cervix was then further dilated up to #27 [facility] dilator.  The Sonata handpiece was then inserted and a complete uterine survey  was performed with the ultrasound. Several fibroids were noted, largest was noted at the 6 o'clock position posteriorly. One of the fibroids was located predominantly in subserosal position and therefore was not amenable to an ablation. Evidence of  adenomyosis was noted.  After mapping out the fibroids location the Sonata fibroid ablation was then performed on the fibroid side 3 cm, a type 5 at the 6 o'clock position, mid posterior fundal location, the ablation size was 2.3 x  1.3 with a time of 1  minute. Due to its location and closed proximity to the serosa a further additional ablation could not be done. At that point the [facility] handpiece was removed.  All cycles were carried out under direct ultrasound intrauterine guidance and visualization  with the ablation guide noted to be at the serosa at the time. The fibroids treated appeared ablated with ultrasound guidance with outgassing noted by ultrasound appearance.  The hysteroscope was reinserted.  No endometrial surface ablation was seen.   The endometrial polypoid lesions were still present and therefore a Reach resectoscope was then introduced and the entire endometrial cavity along with the polypoid lesions were all resected and removed. Endocervical canal was without any lesions.  At  that point, the procedure was felt to be complete, all instruments were then removed from the vagina. Good hemostasis was noted.  SPECIMEN:  Endometrial curettings with polyp, sent to pathology.  ESTIMATED BLOOD LOSS:  5 mL.  FLUID DEFICIT:  450 mL.  COMPLICATIONS:  None.  DISPOSITION:  The patient tolerated the procedure well and was transferred to recovery room in stable condition.   PUS D: 11/03/2022 8:27:32 pm T: 11/04/2022 12:51:00 am  JOB: [id number]/ [id number]

## 2022-11-05 ENCOUNTER — Encounter (HOSPITAL_BASED_OUTPATIENT_CLINIC_OR_DEPARTMENT_OTHER): Payer: Self-pay | Admitting: Obstetrics and Gynecology

## 2022-11-05 LAB — SURGICAL PATHOLOGY

## 2022-11-06 ENCOUNTER — Ambulatory Visit: Payer: 59

## 2022-11-16 ENCOUNTER — Ambulatory Visit (INDEPENDENT_AMBULATORY_CARE_PROVIDER_SITE_OTHER): Payer: 59 | Admitting: Family Medicine

## 2022-11-16 ENCOUNTER — Encounter: Payer: Self-pay | Admitting: Family Medicine

## 2022-11-16 VITALS — BP 117/78 | HR 91 | Temp 97.9°F | Resp 18 | Ht 61.0 in | Wt 208.2 lb

## 2022-11-16 DIAGNOSIS — E7800 Pure hypercholesterolemia, unspecified: Secondary | ICD-10-CM

## 2022-11-16 DIAGNOSIS — D500 Iron deficiency anemia secondary to blood loss (chronic): Secondary | ICD-10-CM | POA: Diagnosis not present

## 2022-11-16 NOTE — Progress Notes (Signed)
Subjective:     Patient ID: Paige Fritz, female    DOB: May 27, 1990, 32 y.o.   MRN: [id number]  Chief Complaint  Patient presents with   Medical Management of Chronic Issues    6 month follow-up Concerned about cholesterol     HPI Cholesterol - Regularly takes simvastatin 20 mg, tolerating well. Has not had cholesterol checked recently. Not fasting - ate a light lunch consisting of vegetables  at 12 pm.   Uterine Ablation- Pt states she feels energized, iron improved. She states her last iron infusion was 6/29. Hemoglobin 12 prior to iron infusion.   Constipation- She states she experienced constipation with iron pills but would like an alternative to infusions. States she is consistently taking vitamin D. Pt states she will try to slowly incorporate liquid iron into diet.  Breast reduction-  Is working towards losing weight for her breast reduction surgery. Has follow up with Dr. Ulice Bold scheduled on 9/13.  Exercising- Pt states she is now regularly exercising and added her apple watch steps to MyChart. Attempts to exercise for about 15 minutes after meals at work. Is actively trying to loose weight and increase protein without meats as she does not typically like meats.   Health Maintenance Due  Topic Date Due   PAP SMEAR-Modifier  Never done    Past Medical History:  Diagnosis Date   Anxiety    Chronic back pain    per pt due to large breast   Chronic headaches    per pt due to back pain   Intramural and subserous leiomyoma of uterus    Iron deficiency anemia due to chronic blood loss 2015   hematology/ oncologist--- David Stall PA;   treated w/ iron infusions   Menorrhagia with regular cycle    Vitamin D deficiency     Past Surgical History:  Procedure Laterality Date   HYSTEROSCOPY WITH D & C  11/02/2022   Procedure: DILATATION AND CURETTAGE /HYSTEROSCOPY;  Surgeon: Maxie Better, MD;  Location: [facility];  Service: Gynecology;;    NO PAST SURGERIES     WISDOM TOOTH EXTRACTION  2019     Current Outpatient Medications:    Phentermine HCl (LOMAIRA) 8 MG TABS, Take 1 tablet (8 mg total) by mouth daily., Disp: 30 tablet, Rfl: 0   simvastatin (ZOCOR) 20 MG tablet, Take 1 tablet (20 mg total) by mouth at bedtime. (Patient taking differently: Take 20 mg by mouth at bedtime.), Disp: 90 tablet, Rfl: 0   topiramate (TOPAMAX) 25 MG tablet, Take 2 tablets (50 mg total) by mouth daily., Disp: 60 tablet, Rfl: 0   triamcinolone cream (KENALOG) 0.1 %, Apply 1 Application topically 2 (two) times daily. (Patient taking differently: Apply 1 Application topically 2 (two) times daily as needed.), Disp: 30 g, Rfl: 0   Vitamin D, Ergocalciferol, (DRISDOL) 1.25 MG (50000 UNIT) CAPS capsule, Take 1 capsule (50,000 Units total) by mouth every 7 (seven) days., Disp: 4 capsule, Rfl: 0  No Known Allergies ROS neg/noncontributory except as noted HPI/below      Objective:     BP 117/78   Pulse 91   Temp 97.9 F (36.6 C) (Temporal)   Resp 18   Ht 5\' 1"  (1.549 m)   Wt 208 lb 4 oz (94.5 kg)   LMP 10/26/2022 (Exact Date)   SpO2 100%   BMI 39.35 kg/m  Wt Readings from Last 3 Encounters:  11/16/22 208 lb 4 oz (94.5 kg)  11/02/22 210  lb 4.8 oz (95.4 kg)  10/31/22 207 lb (93.9 kg)    Physical Exam   Gen: WDWN NAD HEENT: NCAT, conjunctiva not injected, sclera nonicteric NECK:  supple, no thyromegaly, no nodes, no carotid bruits   CARDIAC: RRR, S1S2+, no murmur. DP 2+B LUNGS: CTAB. No wheezes ABDOMEN:  BS+, soft, NTND, No HSM, no masses EXT:  no edema MSK: no gross abnormalities.  NEURO: A&O x3.  CN II-XII intact.  PSYCH: normal mood. Good eye contact     Assessment & Plan:  Pure hypercholesterolemia -     Comprehensive metabolic panel -     Hemoglobin A1c -     Lipid panel   HLD-chronic.  ?control as just started simvastatin 20mg  sev months ago-check labs.  Continue die/exercise/wt loss clinic IDA-chronic.  From uterine  bleeding.  S/p iron infusions and endometrial ablation.  Followed by gyn/heme.  Pt will try to get more iron in diet.  Return in about 6 months (around 05/19/2023) for annual physical.   I,Rachel Rivera,acting as a scribe for [name], MD.,have documented all relevant documentation on the behalf of [name], MD,as directed by  [name], MD while in the presence of [name], MD.  I, [name], MD, have reviewed all documentation for this visit. The documentation on 11/16/22 for the exam, diagnosis, procedures, and orders are all accurate and complete.   [name], MD

## 2022-11-16 NOTE — Patient Instructions (Signed)

## 2022-11-17 LAB — COMPREHENSIVE METABOLIC PANEL
AG Ratio: 1.3 (calc) (ref 1.0–2.5)
ALT: 11 U/L (ref 6–29)
AST: 14 U/L (ref 10–30)
Albumin: 4 g/dL (ref 3.6–5.1)
Alkaline phosphatase (APISO): 54 U/L (ref 31–125)
BUN: 12 mg/dL (ref 7–25)
CO2: 21 mmol/L (ref 20–32)
Calcium: 9.1 mg/dL (ref 8.6–10.2)
Chloride: 104 mmol/L (ref 98–110)
Creat: 0.79 mg/dL (ref 0.50–0.97)
Globulin: 3 g/dL (calc) (ref 1.9–3.7)
Glucose, Bld: 78 mg/dL (ref 65–99)
Potassium: 3.8 mmol/L (ref 3.5–5.3)
Sodium: 134 mmol/L — ABNORMAL LOW (ref 135–146)
Total Bilirubin: 0.3 mg/dL (ref 0.2–1.2)
Total Protein: 7 g/dL (ref 6.1–8.1)

## 2022-11-17 LAB — LIPID PANEL
Cholesterol: 216 mg/dL — ABNORMAL HIGH (ref <200)
HDL: 68 mg/dL (ref >=50)
LDL Cholesterol (Calc): 130 mg/dL (calc) — ABNORMAL HIGH
Non-HDL Cholesterol (Calc): 148 mg/dL (calc) — ABNORMAL HIGH (ref <130)
Total CHOL/HDL Ratio: 3.2 (calc) (ref <5.0)
Triglycerides: 84 mg/dL (ref <150)

## 2022-11-17 LAB — HEMOGLOBIN A1C
Hgb A1c MFr Bld: 5.1 % of total Hgb (ref <5.7)
Mean Plasma Glucose: 100 mg/dL
eAG (mmol/L): 5.5 mmol/L

## 2022-11-18 NOTE — Progress Notes (Signed)
Cholesterol has improved!.  Continue the simvastatin and working on diet/exercise Sodium very slightly low-suspect from the anesthesia.  If getting dizzy, confused, naueaseated, let us know

## 2022-11-23 ENCOUNTER — Other Ambulatory Visit: Payer: 59

## 2022-11-23 ENCOUNTER — Ambulatory Visit: Payer: 59 | Admitting: Physician Assistant

## 2022-11-27 ENCOUNTER — Telehealth (INDEPENDENT_AMBULATORY_CARE_PROVIDER_SITE_OTHER): Payer: Self-pay | Admitting: Family Medicine

## 2022-11-27 NOTE — Telephone Encounter (Signed)
L/mess om 11/27/22 @ 4:43

## 2022-11-27 NOTE — Telephone Encounter (Signed)
Pt called in stating she is out of her Vitamin D.She also stated she wants Dr. Sherwood Gambler CMA to call her.     Thank you, Vickie         I wasn't sure if she needed and appointment for the Vitamin D refill.

## 2022-11-27 NOTE — Telephone Encounter (Signed)
7/23 called patient left voicemail to reschedule appointment due to office closing.

## 2022-11-28 ENCOUNTER — Other Ambulatory Visit (INDEPENDENT_AMBULATORY_CARE_PROVIDER_SITE_OTHER): Payer: Self-pay

## 2022-11-28 ENCOUNTER — Encounter (INDEPENDENT_AMBULATORY_CARE_PROVIDER_SITE_OTHER): Payer: Self-pay

## 2022-11-28 DIAGNOSIS — E559 Vitamin D deficiency, unspecified: Secondary | ICD-10-CM

## 2022-11-28 MED ORDER — VITAMIN D (ERGOCALCIFEROL) 1.25 MG (50000 UNIT) PO CAPS
50000.0000 [IU] | ORAL_CAPSULE | ORAL | 0 refills | Status: DC
Start: 2022-11-28 — End: 2022-12-31

## 2022-11-29 ENCOUNTER — Other Ambulatory Visit: Payer: Self-pay | Admitting: Physician Assistant

## 2022-11-29 DIAGNOSIS — D500 Iron deficiency anemia secondary to blood loss (chronic): Secondary | ICD-10-CM

## 2022-11-30 ENCOUNTER — Inpatient Hospital Stay: Payer: 59

## 2022-11-30 ENCOUNTER — Inpatient Hospital Stay: Payer: 59 | Attending: Physician Assistant | Admitting: Physician Assistant

## 2022-11-30 VITALS — BP 119/69 | HR 77 | Temp 98.4°F | Resp 18 | Ht 61.0 in | Wt 207.8 lb

## 2022-11-30 DIAGNOSIS — Z5986 Financial insecurity: Secondary | ICD-10-CM | POA: Diagnosis not present

## 2022-11-30 DIAGNOSIS — M549 Dorsalgia, unspecified: Secondary | ICD-10-CM | POA: Insufficient documentation

## 2022-11-30 DIAGNOSIS — D500 Iron deficiency anemia secondary to blood loss (chronic): Secondary | ICD-10-CM

## 2022-11-30 DIAGNOSIS — D509 Iron deficiency anemia, unspecified: Secondary | ICD-10-CM | POA: Diagnosis not present

## 2022-11-30 DIAGNOSIS — R109 Unspecified abdominal pain: Secondary | ICD-10-CM | POA: Insufficient documentation

## 2022-11-30 DIAGNOSIS — Z79899 Other long term (current) drug therapy: Secondary | ICD-10-CM | POA: Diagnosis not present

## 2022-11-30 DIAGNOSIS — N840 Polyp of corpus uteri: Secondary | ICD-10-CM | POA: Insufficient documentation

## 2022-11-30 DIAGNOSIS — Z8349 Family history of other endocrine, nutritional and metabolic diseases: Secondary | ICD-10-CM | POA: Diagnosis not present

## 2022-11-30 DIAGNOSIS — Z8249 Family history of ischemic heart disease and other diseases of the circulatory system: Secondary | ICD-10-CM | POA: Diagnosis not present

## 2022-11-30 DIAGNOSIS — G8929 Other chronic pain: Secondary | ICD-10-CM | POA: Insufficient documentation

## 2022-11-30 LAB — CBC WITH DIFFERENTIAL (CANCER CENTER ONLY)
Abs Immature Granulocytes: 0.01 10*3/uL (ref 0.00–0.07)
Basophils Absolute: 0.1 10*3/uL (ref 0.0–0.1)
Basophils Relative: 1 %
Eosinophils Absolute: 0.1 10*3/uL (ref 0.0–0.5)
Eosinophils Relative: 3 %
HCT: 36.3 % (ref 36.0–46.0)
Hemoglobin: 12.3 g/dL (ref 12.0–15.0)
Immature Granulocytes: 0 %
Lymphocytes Relative: 43 %
Lymphs Abs: 2 10*3/uL (ref 0.7–4.0)
MCH: 30.8 pg (ref 26.0–34.0)
MCHC: 33.9 g/dL (ref 30.0–36.0)
MCV: 90.8 fL (ref 80.0–100.0)
Monocytes Absolute: 0.3 10*3/uL (ref 0.1–1.0)
Monocytes Relative: 7 %
Neutro Abs: 2.1 10*3/uL (ref 1.7–7.7)
Neutrophils Relative %: 46 %
Platelet Count: 321 10*3/uL (ref 150–400)
RBC: 4 MIL/uL (ref 3.87–5.11)
RDW: 14.8 % (ref 11.5–15.5)
WBC Count: 4.6 10*3/uL (ref 4.0–10.5)
nRBC: 0 % (ref 0.0–0.2)

## 2022-11-30 LAB — IRON AND IRON BINDING CAPACITY (CC-WL,HP ONLY)
Iron: 63 ug/dL (ref 28–170)
Saturation Ratios: 23 % (ref 10.4–31.8)
TIBC: 272 ug/dL (ref 250–450)
UIBC: 209 ug/dL (ref 148–442)

## 2022-11-30 LAB — FERRITIN: Ferritin: 115 ng/mL (ref 11–307)

## 2022-11-30 NOTE — Progress Notes (Signed)
[facility] [phone]   [phone]  PROGRESS NOTE  Patient Care Team: Jeani Sow, MD as PCP - General (Family Medicine)   Hematological/Oncological History 1) Labs from PCP, Dr. Reuben Likes: -06/21/2021: WBC 5.5, Hgb 8.6 (L), MCV 77 (L), Plt 356K, Vitamin B12 674, Folate 11.1  2) 07/11/2021: Establish care with [facility] Hematology[facility]On[facility]  3) 07/21/2021: Received IV monoferric 1000 mg x 1 dose  4) 12/19/2021-01/16/2022: Received IV venofer 200 mg x 5 doses  5)  10/06/2022-10/27/2022: Received IV venofer 200 mg x 5 doses  6) 11/02/2022: Underwent RFA of uterine fibroids and resection of endometrial polyps, D&C   CHIEF COMPLAINTS/PURPOSE OF CONSULTATION:  Iron deficiency anemia  HISTORY OF PRESENTING ILLNESS:  Paige Fritz 32 y.o. female returns for a follow up for iron deficiency anemia. She was last seen on 06/22/2022 and in the interim, she received IV venofer x 5 doses and underwent RFA of uterine fibroids. She is unaccompanied for this visit.   On exam today, Ms. Ranz reports her energy levels have improved since undergoing her surgery and receiving IV iron infusion. She reports her menstrual cycles are still lasting 5 days but flow is not as heavy without clots. She reports her abdominal cramping has improved as well. She is otherwise feeling well without fevers, chills, night sweats, shortness of breath, chest pain, cough, nausea, vomiting, diarrhea or constipation.  She has no other complaints.  Rest of the 10 point ROS is below.  MEDICAL HISTORY:  Past Medical History:  Diagnosis Date   Anxiety    Chronic back pain    per pt due to large breast   Chronic headaches    per pt due to back pain   Intramural and subserous leiomyoma of uterus    Iron deficiency anemia due to chronic blood loss 2015   hematology/ oncologist--- David Stall PA;   treated w/ iron infusions   Menorrhagia with regular cycle    Vitamin D  deficiency     SURGICAL HISTORY: Past Surgical History:  Procedure Laterality Date   HYSTEROSCOPY WITH D & C  11/02/2022   Procedure: DILATATION AND CURETTAGE /HYSTEROSCOPY;  Surgeon: Maxie Better, MD;  Location: [facility];  Service: Gynecology;;   NO PAST SURGERIES     WISDOM TOOTH EXTRACTION  2019    SOCIAL HISTORY: Social History   Socioeconomic History   Marital status: Single    Spouse name: Not on file   Number of children: 0   Years of education: Not on file   Highest education level: Associate degree: academic program  Occupational History   Occupation: CNA, Associate Professor, Tax adviser, Consulting civil engineer   Occupation: Copywriter, advertising: [employer]  Tobacco Use   Smoking status: Never   Smokeless tobacco: Never  Vaping Use   Vaping status: Never Used  Substance and Sexual Activity   Alcohol use: Yes    Comment: occasional   Drug use: Never   Sexual activity: Yes    Birth control/protection: None  Other Topics Concern   Not on file  Social History Narrative   Not on file   Social Determinants of Health   Financial Resource Strain: High Risk (08/15/2022)   Overall Financial Resource Strain (CARDIA)    Difficulty of Paying Living Expenses: Hard  Food Insecurity: Food Insecurity Present (08/15/2022)   Hunger Vital Sign    Worried About Running Out of Food in the Last Year: Never true  Ran Out of Food in the Last Year: Sometimes true  Transportation Needs: No Transportation Needs (08/15/2022)   PRAPARE - Administrator, Civil Service (Medical): No    Lack of Transportation (Non-Medical): No  Physical Activity: Insufficiently Active (08/15/2022)   Exercise Vital Sign    Days of Exercise per Week: 2 days    Minutes of Exercise per Session: 20 min  Stress: Stress Concern Present (08/15/2022)   [employer] of Occupational Health - Occupational Stress Questionnaire    Feeling of Stress : To some extent  Social  Connections: Socially Integrated (08/15/2022)   Social Connection and Isolation Panel [NHANES]    Frequency of Communication with Friends and Family: More than three times a week    Frequency of Social Gatherings with Friends and Family: Once a week    Attends Religious Services: More than 4 times per year    Active Member of [employer] or Organizations: Yes    Attends Engineer, structural: More than 4 times per year    Marital Status: Living with partner  Intimate Partner Violence: Not on file    FAMILY HISTORY: Family History  Problem Relation Age of Onset   Obesity Mother    Hypertension Father     ALLERGIES:  has No Known Allergies.  MEDICATIONS:  Current Outpatient Medications  Medication Sig Dispense Refill   Phentermine HCl (LOMAIRA) 8 MG TABS Take 1 tablet (8 mg total) by mouth daily. 30 tablet 0   simvastatin (ZOCOR) 20 MG tablet Take 1 tablet (20 mg total) by mouth at bedtime. (Patient taking differently: Take 20 mg by mouth at bedtime.) 90 tablet 0   topiramate (TOPAMAX) 25 MG tablet Take 2 tablets (50 mg total) by mouth daily. 60 tablet 0   triamcinolone cream (KENALOG) 0.1 % Apply 1 Application topically 2 (two) times daily. (Patient taking differently: Apply 1 Application topically 2 (two) times daily as needed.) 30 g 0   Vitamin D, Ergocalciferol, (DRISDOL) 1.25 MG (50000 UNIT) CAPS capsule Take 1 capsule (50,000 Units total) by mouth every 7 (seven) days. 4 capsule 0   No current facility-administered medications for this visit.    REVIEW OF SYSTEMS:   Constitutional: ( - ) fevers, ( - )  chills , ( - ) night sweats Eyes: ( - ) blurriness of vision, ( - ) double vision, ( - ) watery eyes Ears, nose, mouth, throat, and face: ( - ) mucositis, ( - ) sore throat Respiratory: ( - ) cough, ( -) dyspnea, ( - ) wheezes Cardiovascular: ( - ) palpitation, ( - ) chest discomfort, ( - ) lower extremity swelling Gastrointestinal:  ( - ) nausea, ( - ) heartburn, ( - )  change in bowel habits Skin: ( - ) abnormal skin rashes Lymphatics: ( - ) new lymphadenopathy, ( - ) easy bruising Neurological: ( - ) numbness, ( - ) tingling, ( - ) new weaknesses Behavioral/Psych: ( - ) mood change, ( - ) new changes  All other systems were reviewed with the patient and are negative.  PHYSICAL EXAMINATION: Constitutional: Oriented to person, place, and time and well-developed, well-nourished, and in no distress.  HENT:  Head: Normocephalic and atraumatic.  Cardiovascular: Normal rate, regular rhythm, normal heart sounds   Pulmonary/Chest: Effort normal and breath sounds normal. No respiratory distress. No wheezes. No rales.  Musculoskeletal: Normal range of motion. Exhibits no edema.  Neurological: Alert and oriented to person, place, and time. Exhibits normal muscle tone.  Gait normal. Coordination normal.  Skin: Skin is warm and dry. No rash noted. Not diaphoretic. No erythema. No pallor.  Psychiatric: Mood, memory and judgment normal.    LABORATORY DATA:  I have reviewed the data as listed    Latest Ref Rng & Units 11/30/2022    9:00 AM 11/02/2022    6:13 AM 09/27/2022    3:44 PM  CBC  WBC 4.0 - 10.5 K/uL 4.6  5.5  7.3   Hemoglobin 12.0 - 15.0 g/dL 34.7  42.5  95.6   Hematocrit 36.0 - 46.0 % 36.3  37.2  32.5   Platelets 150 - 400 K/uL 321  316  338        Latest Ref Rng & Units 11/16/2022    3:55 PM 06/22/2022    2:23 PM 03/16/2022    3:35 PM  CMP  Glucose 65 - 99 mg/dL 78  84  86   BUN 7 - 25 mg/dL 12  11  9    Creatinine 0.50 - 0.97 mg/dL 3.87  5.64  3.32   Sodium 135 - 146 mmol/L 134  137  135   Potassium 3.5 - 5.3 mmol/L 3.8  3.9  3.7   Chloride 98 - 110 mmol/L 104  104  103   CO2 20 - 32 mmol/L 21  28  27    Calcium 8.6 - 10.2 mg/dL 9.1  9.3  9.4   Total Protein 6.1 - 8.1 g/dL 7.0  7.4  8.1   Total Bilirubin 0.2 - 1.2 mg/dL 0.3  0.3  0.3   Alkaline Phos 38 - 126 U/L  49  48   AST 10 - 30 U/L 14  13  13    ALT 6 - 29 U/L 11  8  8      ASSESSMENT  & PLAN ARYKAH DEROCHER is a 32 y.o. female returns for a follow up for iron deficiency anemia.  #Iron deficiency anemia 2/2 heavy menstrual bleeding --Unable to tolerate iron tablets due to constipation --Underwent RFA of uterine fibroids and resection of endometrial polyps, D&C on 11/02/2022. --Last received IV venofer 200 mg x 5 doses from 10/06/2022-10/27/2022 --Labs from today shows no evidence of anemia with Hgb of 12.3. Iron panel shows no deficiency with ferritin 115, iron saturation 23%, serum rion 63, TIBC 272 --No need for additional IV iron at this time.  --RTC in 3 months for labs only and 6 months with labs/follow up  No orders of the defined types were placed in this encounter.   All questions were answered. The patient knows to call the clinic with any problems, questions or concerns.  I have spent a total of 25 minutes minutes of face-to-face and non-face-to-face time, preparing to see the patient, performing a medically appropriate examination, counseling and educating the patient,  documenting clinical information in the electronic health record,and care coordination.    Georga Kaufmann, PA-C Department of Hematology/Oncology [facility] at [facility] Phone: [phone]

## 2022-12-05 ENCOUNTER — Ambulatory Visit (INDEPENDENT_AMBULATORY_CARE_PROVIDER_SITE_OTHER): Payer: 59 | Admitting: Family Medicine

## 2022-12-26 ENCOUNTER — Ambulatory Visit (INDEPENDENT_AMBULATORY_CARE_PROVIDER_SITE_OTHER): Payer: 59 | Admitting: Family Medicine

## 2022-12-26 ENCOUNTER — Encounter (INDEPENDENT_AMBULATORY_CARE_PROVIDER_SITE_OTHER): Payer: Self-pay

## 2022-12-31 ENCOUNTER — Encounter (INDEPENDENT_AMBULATORY_CARE_PROVIDER_SITE_OTHER): Payer: Self-pay | Admitting: Family Medicine

## 2022-12-31 ENCOUNTER — Ambulatory Visit (INDEPENDENT_AMBULATORY_CARE_PROVIDER_SITE_OTHER): Payer: 59 | Admitting: Family Medicine

## 2022-12-31 VITALS — BP 118/78 | HR 94 | Temp 98.1°F | Ht 61.0 in | Wt 198.0 lb

## 2022-12-31 DIAGNOSIS — E7849 Other hyperlipidemia: Secondary | ICD-10-CM

## 2022-12-31 DIAGNOSIS — Z6837 Body mass index (BMI) 37.0-37.9, adult: Secondary | ICD-10-CM | POA: Diagnosis not present

## 2022-12-31 DIAGNOSIS — E669 Obesity, unspecified: Secondary | ICD-10-CM

## 2022-12-31 DIAGNOSIS — E559 Vitamin D deficiency, unspecified: Secondary | ICD-10-CM | POA: Diagnosis not present

## 2022-12-31 DIAGNOSIS — F5089 Other specified eating disorder: Secondary | ICD-10-CM | POA: Diagnosis not present

## 2022-12-31 MED ORDER — SIMVASTATIN 20 MG PO TABS
20.0000 mg | ORAL_TABLET | Freq: Every day | ORAL | 0 refills | Status: DC
Start: 2022-12-31 — End: 2023-04-10

## 2022-12-31 MED ORDER — VITAMIN D (ERGOCALCIFEROL) 1.25 MG (50000 UNIT) PO CAPS
50000.0000 [IU] | ORAL_CAPSULE | ORAL | 0 refills | Status: DC
Start: 2022-12-31 — End: 2023-01-28

## 2022-12-31 MED ORDER — LOMAIRA 8 MG PO TABS
8.0000 mg | ORAL_TABLET | Freq: Every day | ORAL | 0 refills | Status: DC
Start: 2022-12-31 — End: 2023-01-28

## 2022-12-31 MED ORDER — TOPIRAMATE 25 MG PO TABS
50.0000 mg | ORAL_TABLET | Freq: Every day | ORAL | 0 refills | Status: DC
Start: 2022-12-31 — End: 2023-01-28

## 2022-12-31 NOTE — Progress Notes (Signed)
Chief Complaint:   OBESITY Paige Fritz is here to discuss her progress with her obesity treatment plan along with follow-up of her obesity related diagnoses. Paige Fritz is on keeping a food journal and adhering to recommended goals of 1200-1300 calories and 85+ grams of protein and states she is following her eating plan approximately 90% of the time. [name]. states she is working out for 30-35 minutes 5-6 times per week, and walking 13,000 steps 7 times per week.  Today's visit was #: 10 Starting weight: 182 lbs Starting date: 06/21/2021 Today's weight: 198 lbs Today's date: 12/31/2022 Total lbs lost to date: 0 Total lbs lost since last in-office visit: 9  Interim History: Since last appointment she went on vacation and focusing on food intake choices.  She is noticing that when she eats out she is experiencing nausea and abdominal pain. She noticed that when she ate in [place] she felt significant less bloated and nauseous.  She is doing intermittent fasting and gets her first meal in around 11am.  Average total intake for the day around 1250. She has started experiencing some significant neck pain throughout the day.  She is awaiting surgery appointment in September for breast reduction.  Subjective:   1. Vitamin D deficiency Patient is on prescription vitamin D.  She denies nausea, vomiting, or muscle weakness but notes fatigue.  2. Other hyperlipidemia Patient is on statin (Zocor 20 mg daily).  She denies muscle aches or joint aches.  3. Other disorder of eating Patient is on combination Lomaira and topiramate.  Her starting weight was of 213 pounds (now at 198 pounds, down 15 pounds).  Assessment/Plan:   1. Vitamin D deficiency We will refill prescription vitamin D once weekly for 1 month.  - Vitamin D, Ergocalciferol, (DRISDOL) 1.25 MG (50000 UNIT) CAPS capsule; Take 1 capsule (50,000 Units total) by mouth every 7 (seven) days.  Dispense: 4 capsule; Refill: 0  2. Other  hyperlipidemia We will refill Zocor 20 mg daily for 90 days.  - simvastatin (ZOCOR) 20 MG tablet; Take 1 tablet (20 mg total) by mouth at bedtime.  Dispense: 90 tablet; Refill: 0  3. Other disorder of eating We will refill topiramate 25 mg and Lomaira 8 mg daily for 1 month.  - topiramate (TOPAMAX) 25 MG tablet; Take 2 tablets (50 mg total) by mouth daily.  Dispense: 60 tablet; Refill: 0 - Phentermine HCl (LOMAIRA) 8 MG TABS; Take 1 tablet (8 mg total) by mouth daily.  Dispense: 30 tablet; Refill: 0  4. BMI 37.0-37.9, adult  5. Obesity with starting BMI of 36.3 Paige Fritz is currently in the action stage of change. As such, her goal is to continue with weight loss efforts. She has agreed to keeping a food journal and adhering to recommended goals of 1200-1300 calories and 85+ grams of protein daily.   Exercise goals: As is.   Behavioral modification strategies: increasing lean protein intake, meal planning and cooking strategies, keeping healthy foods in the home, and planning for success.  Coila has agreed to follow-up with our clinic in 4 weeks. She was informed of the importance of frequent follow-up visits to maximize her success with intensive lifestyle modifications for her multiple health conditions.   Objective:   Blood pressure 118/78, pulse 94, temperature 98.1 F (36.7 C), height 5\' 1"  (1.549 m), weight 198 lb (89.8 kg), SpO2 100%. Body mass index is 37.41 kg/m.  General: Cooperative, alert, well developed, in no acute distress. HEENT: Conjunctivae and lids unremarkable.  Cardiovascular: Regular rhythm.  Lungs: Normal work of breathing. Neurologic: No focal deficits.   Lab Results  Component Value Date   CREATININE 0.79 11/16/2022   BUN 12 11/16/2022   NA 134 (L) 11/16/2022   K 3.8 11/16/2022   CL 104 11/16/2022   CO2 21 11/16/2022   Lab Results  Component Value Date   ALT 11 11/16/2022   AST 14 11/16/2022   ALKPHOS 49 06/22/2022   BILITOT 0.3 11/16/2022    Lab Results  Component Value Date   HGBA1C 5.1 11/16/2022   HGBA1C 5.3 07/24/2022   HGBA1C 5.4 06/21/2021   Lab Results  Component Value Date   INSULIN 12.0 07/24/2022   INSULIN 10.7 06/21/2021   Lab Results  Component Value Date   TSH 3.390 06/21/2021   Lab Results  Component Value Date   CHOL 216 (H) 11/16/2022   HDL 68 11/16/2022   LDLCALC 130 (H) 11/16/2022   TRIG 84 11/16/2022   CHOLHDL 3.2 11/16/2022   Lab Results  Component Value Date   VD25OH 20.5 (L) 07/24/2022   VD25OH 34.01 09/06/2021   VD25OH 5.9 (L) 06/21/2021   Lab Results  Component Value Date   WBC 4.6 11/30/2022   HGB 12.3 11/30/2022   HCT 36.3 11/30/2022   MCV 90.8 11/30/2022   PLT 321 11/30/2022   Lab Results  Component Value Date   IRON 63 11/30/2022   TIBC 272 11/30/2022   FERRITIN 115 11/30/2022   Attestation Statements:   Reviewed by clinician on day of visit: allergies, medications, problem list, medical history, surgical history, family history, social history, and previous encounter notes.   I, Burt Knack, am acting as transcriptionist for Reuben Likes, MD.  I have reviewed the above documentation for accuracy and completeness, and I agree with the above. - Reuben Likes, MD

## 2023-01-18 ENCOUNTER — Telehealth: Payer: 59 | Admitting: Plastic Surgery

## 2023-01-22 ENCOUNTER — Ambulatory Visit (INDEPENDENT_AMBULATORY_CARE_PROVIDER_SITE_OTHER): Payer: 59 | Admitting: Plastic Surgery

## 2023-01-22 DIAGNOSIS — G8929 Other chronic pain: Secondary | ICD-10-CM

## 2023-01-22 DIAGNOSIS — N62 Hypertrophy of breast: Secondary | ICD-10-CM

## 2023-01-22 DIAGNOSIS — M542 Cervicalgia: Secondary | ICD-10-CM

## 2023-01-22 DIAGNOSIS — M546 Pain in thoracic spine: Secondary | ICD-10-CM

## 2023-01-22 NOTE — Progress Notes (Signed)
Subjective:    Patient ID: Paige Fritz, female    DOB: [birth year], 32 y.o.   MRN: [id number]  The patient is a 32 year old female joining me by phone for further discussion about her breasts.  She has complained of mammary hyperplasia.  She is also having back pain and now neck pain as well.  She has gotten really bad grooves at her bra strap that is unrelieved with padding and support.  She has hyperpigmentation at the inframammary fold.  Her sternal notch to nipple distance is 31 cm on the left and 33 cm on the right.  She is 5 feet 1 inch tall and now weighs 197 pounds.  Her body mass index is 37.2 kg/m.  I believe that we could get 650 g off of both breasts.    Review of Systems  Constitutional: Negative.   HENT: Negative.    Eyes: Negative.   Respiratory: Negative.  Negative for chest tightness.   Cardiovascular: Negative.  Negative for leg swelling.  Gastrointestinal: Negative.   Endocrine: Negative.   Genitourinary: Negative.   Musculoskeletal:  Positive for back pain and neck pain.  Skin:  Positive for rash.       Objective:   Physical Exam     Assessment & Plan:     ICD-10-CM   1. Chronic bilateral thoracic back pain  M54.6    G89.29     2. Symptomatic mammary hypertrophy  N62     3. Neck pain  M54.2        I connected with  Paige Fritz on 01/22/23 by phone and verified that I am speaking with the correct person using two identifiers.  The patient was at home as far as I know and I was at the office.  We spent 10 minutes in discussion.  The patient has lost weight and would like to move ahead with bilateral breast reduction with liposuction.  She may need an amputation technique and she is aware of that.   I discussed the limitations of evaluation and management by telemedicine. The patient expressed understanding and agreed to proceed.

## 2023-01-28 ENCOUNTER — Ambulatory Visit (INDEPENDENT_AMBULATORY_CARE_PROVIDER_SITE_OTHER): Payer: 59 | Admitting: Family Medicine

## 2023-01-28 ENCOUNTER — Encounter (INDEPENDENT_AMBULATORY_CARE_PROVIDER_SITE_OTHER): Payer: Self-pay | Admitting: Family Medicine

## 2023-01-28 VITALS — BP 110/73 | HR 88 | Temp 98.5°F | Ht 61.0 in | Wt 197.0 lb

## 2023-01-28 DIAGNOSIS — Z6836 Body mass index (BMI) 36.0-36.9, adult: Secondary | ICD-10-CM

## 2023-01-28 DIAGNOSIS — F5089 Other specified eating disorder: Secondary | ICD-10-CM

## 2023-01-28 DIAGNOSIS — E669 Obesity, unspecified: Secondary | ICD-10-CM

## 2023-01-28 DIAGNOSIS — E559 Vitamin D deficiency, unspecified: Secondary | ICD-10-CM | POA: Diagnosis not present

## 2023-01-28 DIAGNOSIS — Z6837 Body mass index (BMI) 37.0-37.9, adult: Secondary | ICD-10-CM

## 2023-01-28 NOTE — Progress Notes (Unsigned)
Chief Complaint:   OBESITY Paige Fritz is here to discuss her progress with her obesity treatment plan along with follow-up of her obesity related diagnoses. Paige Fritz is on keeping a food journal and adhering to recommended goals of 1200-300 calories and 85+ grams of protein and states she is following her eating plan approximately 85% of the time. Paige Fritz states she is waking 9,000-10,000 steps 6 times per week.    Today's visit was #: 11 Starting weight: 182 lbs Starting date: 06/21/2021 Today's weight: 197 lbs Today's date: 01/28/2023 Total lbs lost to date: 0 Total lbs lost since last in-office visit: 1  Interim History: Patient had visit with Dr. Ulice Bold and is awaiting insurance approval of her surgery.  She talked to a few people who have had breast reductions and is trying to figure out how long she will be out of work. She is eagerly anticipating her surgery date to be set.  Since last appointment she has been doing abotu the same in terms of food.  Tonight she is doing grillen chicken, sometimes steak, vegetables.  She is doing intermittent fasting and ending up calorie wise getting in around 1000 calories daily.  Average protein intake has been around 65g per day.  She is doing greek yogurt in the am, [place] sandwich at lunch and meat and vegetable for dinner.  Subjective:   1. Vitamin D deficiency Patient denies nausea, vomiting, or muscle weakness but notes fatigue.  Her last vitamin D level was 20.5 in March 2024.  2. Other disorder of eating Patient starting weight was 213 pounds and she is now at 197 pounds, so she is down 16 pounds since starting.  No side effects of palpitations or mood changes were noted.  Assessment/Plan:   1. Vitamin D deficiency Patient will continue prescription vitamin D 50,000 IU once weekly, and we will refill for 1 month.  - Vitamin D, Ergocalciferol, (DRISDOL) 1.25 MG (50000 UNIT) CAPS capsule; Take 1 capsule (50,000 Units total) by  mouth every 7 (seven) days.  Dispense: 4 capsule; Refill: 0   2. Other disorder of eating Patient will continue her medications, and we will refill topiramate 50 mg and Lomaira 8 mg for 1 month.  - topiramate (TOPAMAX) 25 MG tablet; Take 2 tablets (50 mg total) by mouth daily.  Dispense: 60 tablet; Refill: 0 - Phentermine HCl (LOMAIRA) 8 MG TABS; Take 1 tablet (8 mg total) by mouth daily.  Dispense: 30 tablet; Refill: 0  3. BMI 37.0-37.9, adult  4. Obesity with starting BMI of 36.3 Paige Fritz is currently in the action stage of change. As such, her goal is to continue with weight loss efforts. She has agreed to keeping a food journal and adhering to recommended goals of 1200-1300 calories and 85+ grams of protein daily. (Increase total food intake).  Exercise goals: All adults should avoid inactivity. Some physical activity is better than none, and adults who participate in any amount of physical activity gain some health benefits.  Behavioral modification strategies: increasing lean protein intake, meal planning and cooking strategies, keeping healthy foods in the home, and planning for success.  Paige Fritz has agreed to follow-up with our clinic in 4 weeks. She was informed of the importance of frequent follow-up visits to maximize her success with intensive lifestyle modifications for her multiple health conditions.   Objective:   Blood pressure 110/73, pulse 88, temperature 98.5 F (36.9 C), height 5\' 1"  (1.549 m), weight 197 lb (89.4 kg), SpO2 100%. Body mass  index is 37.22 kg/m.  General: Cooperative, alert, well developed, in no acute distress. HEENT: Conjunctivae and lids unremarkable. Cardiovascular: Regular rhythm.  Lungs: Normal work of breathing. Neurologic: No focal deficits.   Lab Results  Component Value Date   CREATININE 0.79 11/16/2022   BUN 12 11/16/2022   NA 134 (L) 11/16/2022   K 3.8 11/16/2022   CL 104 11/16/2022   CO2 21 11/16/2022   Lab Results  Component  Value Date   ALT 11 11/16/2022   AST 14 11/16/2022   ALKPHOS 49 06/22/2022   BILITOT 0.3 11/16/2022   Lab Results  Component Value Date   HGBA1C 5.1 11/16/2022   HGBA1C 5.3 07/24/2022   HGBA1C 5.4 06/21/2021   Lab Results  Component Value Date   INSULIN 12.0 07/24/2022   INSULIN 10.7 06/21/2021   Lab Results  Component Value Date   TSH 3.390 06/21/2021   Lab Results  Component Value Date   CHOL 216 (H) 11/16/2022   HDL 68 11/16/2022   LDLCALC 130 (H) 11/16/2022   TRIG 84 11/16/2022   CHOLHDL 3.2 11/16/2022   Lab Results  Component Value Date   VD25OH 20.5 (L) 07/24/2022   VD25OH 34.01 09/06/2021   VD25OH 5.9 (L) 06/21/2021   Lab Results  Component Value Date   WBC 4.6 11/30/2022   HGB 12.3 11/30/2022   HCT 36.3 11/30/2022   MCV 90.8 11/30/2022   PLT 321 11/30/2022   Lab Results  Component Value Date   IRON 63 11/30/2022   TIBC 272 11/30/2022   FERRITIN 115 11/30/2022   Attestation Statements:   Reviewed by clinician on day of visit: allergies, medications, problem list, medical history, surgical history, family history, social history, and previous encounter notes.   I, Burt Knack, am acting as transcriptionist for Reuben Likes, MD.  I have reviewed the above documentation for accuracy and completeness, and I agree with the above. - Reuben Likes, MD

## 2023-01-30 MED ORDER — LOMAIRA 8 MG PO TABS
8.0000 mg | ORAL_TABLET | Freq: Every day | ORAL | 0 refills | Status: DC
Start: 2023-01-30 — End: 2023-03-06

## 2023-01-30 MED ORDER — VITAMIN D (ERGOCALCIFEROL) 1.25 MG (50000 UNIT) PO CAPS
50000.0000 [IU] | ORAL_CAPSULE | ORAL | 0 refills | Status: DC
Start: 2023-01-30 — End: 2023-03-06

## 2023-01-30 MED ORDER — TOPIRAMATE 25 MG PO TABS
50.0000 mg | ORAL_TABLET | Freq: Every day | ORAL | 0 refills | Status: DC
Start: 2023-01-30 — End: 2023-03-06

## 2023-02-05 ENCOUNTER — Telehealth: Payer: Self-pay | Admitting: Plastic Surgery

## 2023-02-05 NOTE — Telephone Encounter (Signed)
Pt called asking to speak with a nurse or Dr Ulice Bold concerning a letter she stated Dr Ulice Bold put in her chart about how much she can take off, I do not see a letter in her chart from Dr Ulice Bold and pt requested a call back. Thank you. Pt stated she was told a sx sch would call her in a couple weeks to sch her sx, I did let her know that getting an auth from insurance can take up to 6 weeks depending on how fast the insurance gets back with [place]. She asked why was she told two week in her visit. Please advise if possible.

## 2023-02-18 DIAGNOSIS — N920 Excessive and frequent menstruation with regular cycle: Secondary | ICD-10-CM | POA: Diagnosis not present

## 2023-02-18 DIAGNOSIS — N840 Polyp of corpus uteri: Secondary | ICD-10-CM | POA: Diagnosis not present

## 2023-02-18 DIAGNOSIS — D250 Submucous leiomyoma of uterus: Secondary | ICD-10-CM | POA: Diagnosis not present

## 2023-02-18 DIAGNOSIS — D251 Intramural leiomyoma of uterus: Secondary | ICD-10-CM | POA: Diagnosis not present

## 2023-02-28 ENCOUNTER — Encounter (INDEPENDENT_AMBULATORY_CARE_PROVIDER_SITE_OTHER): Payer: Self-pay

## 2023-02-28 ENCOUNTER — Inpatient Hospital Stay: Payer: 59 | Attending: Physician Assistant

## 2023-02-28 DIAGNOSIS — D500 Iron deficiency anemia secondary to blood loss (chronic): Secondary | ICD-10-CM | POA: Insufficient documentation

## 2023-02-28 DIAGNOSIS — Z79899 Other long term (current) drug therapy: Secondary | ICD-10-CM | POA: Insufficient documentation

## 2023-02-28 DIAGNOSIS — N920 Excessive and frequent menstruation with regular cycle: Secondary | ICD-10-CM | POA: Diagnosis not present

## 2023-02-28 LAB — CBC WITH DIFFERENTIAL (CANCER CENTER ONLY)
Abs Immature Granulocytes: 0.03 10*3/uL (ref 0.00–0.07)
Basophils Absolute: 0 10*3/uL (ref 0.0–0.1)
Basophils Relative: 1 %
Eosinophils Absolute: 0.1 10*3/uL (ref 0.0–0.5)
Eosinophils Relative: 2 %
HCT: 34 % — ABNORMAL LOW (ref 36.0–46.0)
Hemoglobin: 11.9 g/dL — ABNORMAL LOW (ref 12.0–15.0)
Immature Granulocytes: 1 %
Lymphocytes Relative: 43 %
Lymphs Abs: 2.4 10*3/uL (ref 0.7–4.0)
MCH: 32.2 pg (ref 26.0–34.0)
MCHC: 35 g/dL (ref 30.0–36.0)
MCV: 91.9 fL (ref 80.0–100.0)
Monocytes Absolute: 0.4 10*3/uL (ref 0.1–1.0)
Monocytes Relative: 7 %
Neutro Abs: 2.6 10*3/uL (ref 1.7–7.7)
Neutrophils Relative %: 46 %
Platelet Count: 358 10*3/uL (ref 150–400)
RBC: 3.7 MIL/uL — ABNORMAL LOW (ref 3.87–5.11)
RDW: 13 % (ref 11.5–15.5)
WBC Count: 5.6 10*3/uL (ref 4.0–10.5)
nRBC: 0 % (ref 0.0–0.2)

## 2023-02-28 LAB — IRON AND IRON BINDING CAPACITY (CC-WL,HP ONLY)
Iron: 82 ug/dL (ref 28–170)
Saturation Ratios: 29 % (ref 10.4–31.8)
TIBC: 286 ug/dL (ref 250–450)
UIBC: 204 ug/dL

## 2023-02-28 LAB — FERRITIN: Ferritin: 63 ng/mL (ref 11–307)

## 2023-03-01 ENCOUNTER — Inpatient Hospital Stay: Payer: 59

## 2023-03-01 ENCOUNTER — Other Ambulatory Visit: Payer: 59

## 2023-03-04 NOTE — Telephone Encounter (Signed)
Can you please open 4:40 and put pt in that spot? Thanks, [employer]

## 2023-03-05 ENCOUNTER — Ambulatory Visit (INDEPENDENT_AMBULATORY_CARE_PROVIDER_SITE_OTHER): Payer: 59 | Admitting: Family Medicine

## 2023-03-06 ENCOUNTER — Encounter (INDEPENDENT_AMBULATORY_CARE_PROVIDER_SITE_OTHER): Payer: Self-pay | Admitting: Family Medicine

## 2023-03-06 ENCOUNTER — Ambulatory Visit (INDEPENDENT_AMBULATORY_CARE_PROVIDER_SITE_OTHER): Payer: 59 | Admitting: Family Medicine

## 2023-03-06 VITALS — BP 107/72 | HR 89 | Temp 98.5°F | Ht 61.0 in | Wt 191.0 lb

## 2023-03-06 DIAGNOSIS — F5089 Other specified eating disorder: Secondary | ICD-10-CM

## 2023-03-06 DIAGNOSIS — E559 Vitamin D deficiency, unspecified: Secondary | ICD-10-CM | POA: Diagnosis not present

## 2023-03-06 DIAGNOSIS — E669 Obesity, unspecified: Secondary | ICD-10-CM

## 2023-03-06 DIAGNOSIS — Z6836 Body mass index (BMI) 36.0-36.9, adult: Secondary | ICD-10-CM

## 2023-03-06 DIAGNOSIS — E66812 Obesity, class 2: Secondary | ICD-10-CM

## 2023-03-06 MED ORDER — VITAMIN D (ERGOCALCIFEROL) 1.25 MG (50000 UNIT) PO CAPS: 50000.0000 [IU] | ORAL_CAPSULE | ORAL | 0 refills | Status: DC

## 2023-03-06 MED ORDER — TOPIRAMATE 25 MG PO TABS
50.0000 mg | ORAL_TABLET | Freq: Every day | ORAL | 0 refills | Status: DC
Start: 2023-03-06 — End: 2023-04-10

## 2023-03-06 MED ORDER — LOMAIRA 8 MG PO TABS
8.0000 mg | ORAL_TABLET | Freq: Every day | ORAL | 0 refills | Status: DC
Start: 2023-03-06 — End: 2023-04-10

## 2023-03-06 NOTE — Progress Notes (Unsigned)
Chief Complaint:   OBESITY Paige Fritz is here to discuss her progress with her obesity treatment plan along with follow-up of her obesity related diagnoses. Paige Fritz is on keeping a food journal and adhering to recommended goals of 1200 to 1300 calories and 85+ grams of protein and states she is following her eating plan approximately 90% of the time. Paige Fritz states she is going to the gym 30 to 40 minutes 2 times per week and walking 10,000 to 14,000 steps 6 times per week  Today's visit was #: 12 Starting weight: 182 lbs Starting date: 06/21/21 Today's weight: 191 lbs Today's date: 03/06/23 Total lbs lost to date: 6 Total lbs lost since last in-office visit: 6  Interim History: Patient presents for follow up today.  She has been consistently following her journaling plan. She is waiting for scheduling for her breast reduction.  She received a letter from insurance stating the quantity of breast tissue was insufficient for her to qualify. She is trying to be lighter on her sides and focus on protein amount at meal time.  She is eating mostly soft  food since she recently got dental work done. She is still awaiting 1 more root canal.  She is having to be more mindful of getting food in.   Subjective:   1. Vitamin D deficiency Paige Fritz is on prescription vitamin D and her last level was drawn in March. She notes fatigue and denies nausea, vomiting, and muscle weakness.  2. Other disorder of eating Paige Fritz is on a combination of phentermine and topiramate. Her starting weight was 213 in May 2024 and she is now at 191 pounds, which is down 24 pounds.  3. BMI 36.0-36.9,adult  Assessment/Plan:   1. Vitamin D deficiency Paige Fritz agrees to continue taking prescription vitamin D and will follow up at the agreed upon time.  - Vitamin D, Ergocalciferol, (DRISDOL) 1.25 MG (50000 UNIT) CAPS capsule; Take 1 capsule (50,000 Units total) by mouth every 7 (seven) days.  Dispense: 4 capsule;  Refill: 0  2. Other disorder of eating Paige Fritz agrees to continue taking phentermine and topiramate and will follow up at the agreed upon time.  - Phentermine HCl (LOMAIRA) 8 MG TABS; Take 1 tablet (8 mg total) by mouth daily.  Dispense: 30 tablet; Refill: 0 - topiramate (TOPAMAX) 25 MG tablet; Take 2 tablets (50 mg total) by mouth daily.  Dispense: 60 tablet; Refill: 0  3. BMI 36.0-36.9,adult  4. Obesity with starting BMI of 36.3  Paige Fritz is currently in the action stage of change. As such, her goal is to continue with weight loss efforts. She has agreed to keeping a food journal and adhering to recommended goals of 1200 to 1300 calories and 85+ grams of protein.   Exercise goals: All adults should avoid inactivity. Some physical activity is better than none, and adults who participate in any amount of physical activity gain some health benefits.  Behavioral modification strategies: increasing lean protein intake, meal planning and cooking strategies, keeping healthy foods in the home, and planning for success.  Paige Fritz has agreed to follow-up with our clinic in 4 weeks. She was informed of the importance of frequent follow-up visits to maximize her success with intensive lifestyle modifications for her multiple health conditions.   Objective:   Blood pressure 107/72, pulse 89, temperature 98.5 F (36.9 C), height 5\' 1"  (1.549 m), weight 191 lb (86.6 kg), SpO2 100%. Body mass index is 36.09 kg/m.  General: Cooperative, alert,  well developed, in no acute distress. HEENT: Conjunctivae and lids unremarkable. Cardiovascular: Regular rhythm.  Lungs: Normal work of breathing. Neurologic: No focal deficits.   Lab Results  Component Value Date   CREATININE 0.79 11/16/2022   BUN 12 11/16/2022   NA 134 (L) 11/16/2022   K 3.8 11/16/2022   CL 104 11/16/2022   CO2 21 11/16/2022   Lab Results  Component Value Date   ALT 11 11/16/2022   AST 14 11/16/2022   ALKPHOS 49 06/22/2022    BILITOT 0.3 11/16/2022   Lab Results  Component Value Date   HGBA1C 5.1 11/16/2022   HGBA1C 5.3 07/24/2022   HGBA1C 5.4 06/21/2021   Lab Results  Component Value Date   INSULIN 12.0 07/24/2022   INSULIN 10.7 06/21/2021   Lab Results  Component Value Date   TSH 3.390 06/21/2021   Lab Results  Component Value Date   CHOL 216 (H) 11/16/2022   HDL 68 11/16/2022   LDLCALC 130 (H) 11/16/2022   TRIG 84 11/16/2022   CHOLHDL 3.2 11/16/2022   Lab Results  Component Value Date   VD25OH 20.5 (L) 07/24/2022   VD25OH 34.01 09/06/2021   VD25OH 5.9 (L) 06/21/2021   Lab Results  Component Value Date   WBC 5.6 02/28/2023   HGB 11.9 (L) 02/28/2023   HCT 34.0 (L) 02/28/2023   MCV 91.9 02/28/2023   PLT 358 02/28/2023   Lab Results  Component Value Date   IRON 82 02/28/2023   TIBC 286 02/28/2023   FERRITIN 63 02/28/2023   Attestation Statements:   Reviewed by clinician on day of visit: allergies, medications, problem list, medical history, surgical history, family history, social history, and previous encounter notes.  IKirke Corin, CMA, am acting as transcriptionist for Reuben Likes, MD I have reviewed the above documentation for accuracy and completeness, and I agree with the above. - Reuben Likes, MD

## 2023-03-08 ENCOUNTER — Ambulatory Visit (INDEPENDENT_AMBULATORY_CARE_PROVIDER_SITE_OTHER): Payer: 59 | Admitting: Plastic Surgery

## 2023-03-08 ENCOUNTER — Encounter: Payer: Self-pay | Admitting: Plastic Surgery

## 2023-03-08 VITALS — BP 120/74 | HR 98 | Ht 61.0 in | Wt 191.0 lb

## 2023-03-08 DIAGNOSIS — M546 Pain in thoracic spine: Secondary | ICD-10-CM | POA: Diagnosis not present

## 2023-03-08 DIAGNOSIS — M545 Low back pain, unspecified: Secondary | ICD-10-CM | POA: Diagnosis not present

## 2023-03-08 DIAGNOSIS — G8929 Other chronic pain: Secondary | ICD-10-CM

## 2023-03-08 DIAGNOSIS — N62 Hypertrophy of breast: Secondary | ICD-10-CM

## 2023-03-08 DIAGNOSIS — M542 Cervicalgia: Secondary | ICD-10-CM

## 2023-03-08 NOTE — Progress Notes (Signed)
Subjective:    Patient ID: Paige Fritz, female    DOB: Apr 29, 1991, 32 y.o.   MRN: [id number]  The patient is a 32 y.o. female with a history of mammary hyperplasia for several years.  She is joining me again for further discussion since she has been unable to decrease her weight over the last several months.  She has extremely large breasts causing symptoms that include the following: Back pain in the upper and lower back, including neck pain. She pulls or pins her bra straps to provide better lift and relief of the pressure and pain. She notices relief by holding her breast up manually.  Her shoulder straps cause grooves and pain and pressure that requires padding for relief. Pain medication is sometimes required with motrin and tylenol.  Activities that are hindered by enlarged breasts include: exercise and running.  She has tried supportive clothing as well as fitted bras without improvement.  Her breasts are extremely large and fairly symmetric but the right side is slightly longer than the left.  The left looks like it might be fuller.  She has hyperpigmentation of the inframammary area on both sides.  The sternal to nipple distance on the right is 32 cm and the left is 35 cm.  The IMF distance is 23 cm.  She is 5 feet 1 inches tall and weighs 191 pounds.  The BMI = 6.1 kg/m.  Preoperative bra size = J cup.  The estimated excess breast tissue to be removed at the time of surgery = 825 grams on the left and 825 grams on the right.  Mammogram history: Not required.  Family history of breast cancer: None.  Tobacco use: None.   The patient expresses the desire to pursue surgical intervention.    Review of Systems  Constitutional:  Positive for activity change.  HENT: Negative.    Eyes: Negative.   Respiratory: Negative.    Cardiovascular: Negative.   Gastrointestinal: Negative.   Endocrine: Negative.   Genitourinary: Negative.   Musculoskeletal:  Positive for back pain and neck  pain.  Skin:  Positive for rash.       Objective:   Physical Exam Constitutional:      Appearance: Normal appearance.  Cardiovascular:     Rate and Rhythm: Normal rate.     Pulses: Normal pulses.  Pulmonary:     Effort: Pulmonary effort is normal.  Abdominal:     Palpations: Abdomen is soft.  Musculoskeletal:        General: No swelling or deformity.  Skin:    General: Skin is warm.  Neurological:     Mental Status: She is alert and oriented to person, place, and time.  Psychiatric:        Mood and Affect: Mood normal.        Behavior: Behavior normal.        Thought Content: Thought content normal.        Judgment: Judgment normal.        Assessment & Plan:     ICD-10-CM   1. Symptomatic mammary hypertrophy  N62     2. Chronic bilateral thoracic back pain  M54.6    G89.29     3. Neck pain  M54.2       Patient is a good candidate for bilateral breast reduction.  She has done a really good job with weight loss.  Encouraged her to continue to work on that.  I think the medication she is on  has been helpful.  We will submit for bilateral breast reduction with liposuction.  Pictures were obtained of the patient and placed in the chart with the patient's or guardian's permission.

## 2023-03-20 ENCOUNTER — Encounter: Payer: Self-pay | Admitting: *Deleted

## 2023-03-25 ENCOUNTER — Other Ambulatory Visit: Payer: Self-pay | Admitting: Medical Genetics

## 2023-03-25 DIAGNOSIS — Z006 Encounter for examination for normal comparison and control in clinical research program: Secondary | ICD-10-CM

## 2023-04-01 ENCOUNTER — Ambulatory Visit (INDEPENDENT_AMBULATORY_CARE_PROVIDER_SITE_OTHER): Payer: 59 | Admitting: Family Medicine

## 2023-04-10 ENCOUNTER — Ambulatory Visit (INDEPENDENT_AMBULATORY_CARE_PROVIDER_SITE_OTHER): Payer: 59 | Admitting: Family Medicine

## 2023-04-10 VITALS — BP 103/70 | HR 88 | Temp 98.1°F | Ht 61.0 in | Wt 189.0 lb

## 2023-04-10 DIAGNOSIS — E7849 Other hyperlipidemia: Secondary | ICD-10-CM

## 2023-04-10 DIAGNOSIS — E559 Vitamin D deficiency, unspecified: Secondary | ICD-10-CM

## 2023-04-10 DIAGNOSIS — E66812 Obesity, class 2: Secondary | ICD-10-CM

## 2023-04-10 DIAGNOSIS — F509 Eating disorder, unspecified: Secondary | ICD-10-CM | POA: Insufficient documentation

## 2023-04-10 DIAGNOSIS — E7800 Pure hypercholesterolemia, unspecified: Secondary | ICD-10-CM

## 2023-04-10 DIAGNOSIS — Z6835 Body mass index (BMI) 35.0-35.9, adult: Secondary | ICD-10-CM

## 2023-04-10 DIAGNOSIS — E669 Obesity, unspecified: Secondary | ICD-10-CM

## 2023-04-10 DIAGNOSIS — F5089 Other specified eating disorder: Secondary | ICD-10-CM | POA: Diagnosis not present

## 2023-04-10 MED ORDER — VITAMIN D (ERGOCALCIFEROL) 1.25 MG (50000 UNIT) PO CAPS: 50000.0000 [IU] | ORAL_CAPSULE | ORAL | 0 refills | Status: DC

## 2023-04-10 MED ORDER — TOPIRAMATE 50 MG PO TABS
75.0000 mg | ORAL_TABLET | Freq: Every day | ORAL | 0 refills | Status: DC
Start: 2023-04-10 — End: 2023-05-13

## 2023-04-10 MED ORDER — LOMAIRA 8 MG PO TABS
12.0000 mg | ORAL_TABLET | Freq: Every day | ORAL | 0 refills | Status: DC
Start: 2023-04-10 — End: 2023-05-13

## 2023-04-10 MED ORDER — SIMVASTATIN 20 MG PO TABS: 20.0000 mg | ORAL_TABLET | Freq: Every day | ORAL | 0 refills | Status: DC

## 2023-04-10 NOTE — Progress Notes (Signed)
SUBJECTIVE:  Chief Complaint: Obesity  Interim History: Patient is frustrated with situation awaiting a breast reduction. She has been trying to follow plan with exception of Thanksgiving.  She has been craving more sugar recently which is unrelated to her menstrual cycle. She cooked for Thanksgiving and her niece came to visit from [place].  For December holidays she is planning to stay local and low key.  Looking to switch it up in terms of food intake options for the next few weeks.  Vivion is here to discuss her progress with her obesity treatment plan. She is on the keeping a food journal and adhering to recommended goals of 1200-1300 calories and 85+ grams of protein and states she is following her eating plan approximately 80 % of the time. She states she is not exercising.  OBJECTIVE: Visit Diagnoses: Problem List Items Addressed This Visit       Other   Pure hypercholesterolemia    On zocor daily with no myalgias and last LFTs WNL.  Patient needs refill today. Labs to be done with PCP in January.  Refill Zocor today.      Disorder of eating - Primary    On combination lomaira and topiramate.  Starting weight was 213lb at end of May 2024.  Needed to lose 11lbs which would be 5% by mid October.  Weight today of 189lb today.  Needs refill of her medications today and will increase to 12mg  lomaira and 75mg  topiramate to help with sugar cravings that patient will likely experience more of over the holiday.      Vitamin D deficiency    Discussed importance of vitamin d supplementation.  Vitamin d supplementation has been shown to decrease fatigue, decrease risk of progression to insulin resistance and then prediabetes, decreases risk of falling in older age and can even assist in decreasing depressive symptoms in PTSD.   Prescription for Vitamin D sent in.        Other Visit Diagnoses     Other hyperlipidemia           Vitals Temp: 98.1 F (36.7 C) BP: 103/70 Pulse  Rate: 88 SpO2: 100 %   Anthropometric Measurements Height: 5\' 1"  (1.549 m) Weight: 189 lb (85.7 kg) BMI (Calculated): 35.73 Weight at Last Visit: 191 lb Weight Lost Since Last Visit: 2 Weight Gained Since Last Visit: 0 Starting Weight: 192 lb Total Weight Loss (lbs): 3 lb (1.361 kg)   Body Composition  Body Fat %: 42.1 % Fat Mass (lbs): 79.8 lbs Muscle Mass (lbs): 104.2 lbs Total Body Water (lbs): 77 lbs Visceral Fat Rating : 9   Other Clinical Data Today's Visit #: 13 Starting Date: 06/21/21     ASSESSMENT AND PLAN:  Diet: Akyra is currently in the action stage of change. As such, her goal is to continue with weight loss efforts. She has agreed to Category 2 Plan and Pescatarian Plan.  Exercise: E[place]ta has been instructed that some exercise is better than none and to continue exercising as is for weight loss and overall health benefits.   Behavior Modification:  We discussed the following Behavioral Modification Strategies today: increasing lean protein intake, increasing vegetables, meal planning and cooking strategies, better snacking choices, holiday eating strategies, and planning for success. We discussed various medication options to help [facility] with her weight loss efforts and we both agreed to increase makeshift qsymia to 12 and 75mg  combo dose.  No follow-ups on file.Marland Kitchen She was informed of the importance of frequent  follow up visits to maximize her success with intensive lifestyle modifications for her multiple health conditions.  Attestation Statements:   Reviewed by clinician on day of visit: allergies, medications, problem list, medical history, surgical history, family history, social history, and previous encounter notes.     I have reviewed the above documentation for accuracy and completeness, and I agree with the above. - Reuben Likes, MD

## 2023-04-10 NOTE — Assessment & Plan Note (Signed)
 Discussed importance of vitamin d supplementation.  Vitamin d supplementation has been shown to decrease fatigue, decrease risk of progression to insulin resistance and then prediabetes, decreases risk of falling in older age and can even assist in decreasing depressive symptoms in PTSD.   Prescription for Vitamin D sent in.

## 2023-04-10 NOTE — Assessment & Plan Note (Signed)
On zocor daily with no myalgias and last LFTs WNL.  Patient needs refill today. Labs to be done with PCP in January.  Refill Zocor today.

## 2023-04-10 NOTE — Assessment & Plan Note (Addendum)
On combination lomaira and topiramate.  Starting weight was 213lb at end of May 2024.  Needed to lose 11lbs which would be 5% by mid October.  Weight today of 189lb today.  Needs refill of her medications today and will increase to 12mg  lomaira and 75mg  topiramate to help with sugar cravings that patient will likely experience more of over the holiday.

## 2023-05-07 ENCOUNTER — Other Ambulatory Visit (HOSPITAL_COMMUNITY): Payer: Self-pay | Attending: Medical Genetics

## 2023-05-10 ENCOUNTER — Encounter: Payer: Self-pay | Admitting: Physician Assistant

## 2023-05-13 ENCOUNTER — Ambulatory Visit (INDEPENDENT_AMBULATORY_CARE_PROVIDER_SITE_OTHER): Payer: Commercial Managed Care - PPO | Admitting: Family Medicine

## 2023-05-13 ENCOUNTER — Encounter: Payer: Self-pay | Admitting: Physician Assistant

## 2023-05-13 ENCOUNTER — Encounter (INDEPENDENT_AMBULATORY_CARE_PROVIDER_SITE_OTHER): Payer: Self-pay | Admitting: Family Medicine

## 2023-05-13 VITALS — BP 107/73 | HR 91 | Temp 98.2°F | Ht 61.0 in | Wt 187.0 lb

## 2023-05-13 DIAGNOSIS — F5089 Other specified eating disorder: Secondary | ICD-10-CM

## 2023-05-13 DIAGNOSIS — E669 Obesity, unspecified: Secondary | ICD-10-CM

## 2023-05-13 DIAGNOSIS — Z6835 Body mass index (BMI) 35.0-35.9, adult: Secondary | ICD-10-CM | POA: Diagnosis not present

## 2023-05-13 DIAGNOSIS — E559 Vitamin D deficiency, unspecified: Secondary | ICD-10-CM

## 2023-05-13 MED ORDER — TOPIRAMATE 50 MG PO TABS
75.0000 mg | ORAL_TABLET | Freq: Every day | ORAL | 0 refills | Status: DC
Start: 2023-05-13 — End: 2023-06-04

## 2023-05-13 MED ORDER — LOMAIRA 8 MG PO TABS
12.0000 mg | ORAL_TABLET | Freq: Every day | ORAL | 0 refills | Status: DC
Start: 2023-05-13 — End: 2023-06-04

## 2023-05-13 NOTE — Assessment & Plan Note (Addendum)
 Last Vitamin D level of 20 in March of 2024.  She is on Rx supplement.  Needs refill today but will hold off on refill until labs done.  Getting labs done with PCP in 4 days.

## 2023-05-13 NOTE — Progress Notes (Signed)
   SUBJECTIVE:  Chief Complaint: Obesity  Interim History: Patient went out to eat yesterday and she has been having diarrhea ever since. She is in the process of moving currently. She did get approved for a breast reduction.  She is planning to start school next week; she is deciding what she wants to do schedule wise with the possibility of surgery.  She doesn't feel like she has done well with food choices and nutrition.  She has been eating more sweets than she did previously.  She has been eating quite a bit of protein. She will be completely out of her apartment by January 20th.  She feels with all the changes she has been emotionally eating and not eating.   Paige Fritz is here to discuss her progress with her obesity treatment plan. She is on the Category 2 Plan and Pescatarian Plan and states she is following her eating plan approximately 25 % of the time.    OBJECTIVE: Visit Diagnoses: Problem List Items Addressed This Visit       Other   Disorder of eating - Primary   Patient had increased dose of 12mg  lomaira  and topiramate  75mg  at last appointment.  She is still doing some stress eating due to holidays and moving. She needs refill today of her medications.  Starting weight of 213 and at 187 today.  PDMP checked with no concerns noted.      Relevant Medications   Phentermine  HCl (LOMAIRA ) 8 MG TABS   topiramate  (TOPAMAX ) 50 MG tablet   [name]  deficiency   Last [name]  level of 20 in March of 2024.  She is on Rx supplement.  Needs refill today but will hold off on refill until labs done.  Getting labs done with PCP in 4 days.      Other Visit Diagnoses       Obesity with starting BMI of 36.3       Relevant Medications   Phentermine  HCl (LOMAIRA ) 8 MG TABS     BMI 35.0-35.9,adult           No data recorded  No data recorded  No data recorded  No data recorded    ASSESSMENT AND PLAN:  Diet: Paige Fritz is currently in the action stage of change. As such, her  goal is to continue with weight loss efforts. She has agreed to Category 2 Plan and Pescatarian Plan.  Exercise: Paige Fritz has been instructed that some exercise is better than none for weight loss and overall health benefits.   Behavior Modification:  We discussed the following Behavioral Modification Strategies today: increasing lean protein intake, increasing vegetables, no skipping meals, meal planning and cooking strategies, and better snacking choices. We discussed various medication options to help Paige Fritz with her weight loss efforts and we both agreed to refill current dose of phentermine  and topiramate .  No follow-ups on file.SABRA She was informed of the importance of frequent follow up visits to maximize her success with intensive lifestyle modifications for her multiple health conditions.  Attestation Statements:   Reviewed by clinician on day of visit: allergies, medications, problem list, medical history, surgical history, family history, social history, and previous encounter notes.      [name], MD

## 2023-05-13 NOTE — Assessment & Plan Note (Signed)
 Patient had increased dose of 12mg  lomaira  and topiramate  75mg  at last appointment.  She is still doing some stress eating due to holidays and moving. She needs refill today of her medications.  Starting weight of 213 and at 187 today.  PDMP checked with no concerns noted.

## 2023-05-17 ENCOUNTER — Encounter: Payer: Commercial Managed Care - PPO | Admitting: Family Medicine

## 2023-06-04 ENCOUNTER — Encounter (INDEPENDENT_AMBULATORY_CARE_PROVIDER_SITE_OTHER): Payer: Self-pay | Admitting: Family Medicine

## 2023-06-04 ENCOUNTER — Ambulatory Visit (INDEPENDENT_AMBULATORY_CARE_PROVIDER_SITE_OTHER): Payer: Commercial Managed Care - PPO | Admitting: Family Medicine

## 2023-06-04 VITALS — BP 120/79 | HR 94 | Temp 98.1°F | Ht 61.0 in | Wt 183.0 lb

## 2023-06-04 DIAGNOSIS — E669 Obesity, unspecified: Secondary | ICD-10-CM | POA: Diagnosis not present

## 2023-06-04 DIAGNOSIS — N62 Hypertrophy of breast: Secondary | ICD-10-CM

## 2023-06-04 DIAGNOSIS — D500 Iron deficiency anemia secondary to blood loss (chronic): Secondary | ICD-10-CM

## 2023-06-04 DIAGNOSIS — F5089 Other specified eating disorder: Secondary | ICD-10-CM | POA: Diagnosis not present

## 2023-06-04 DIAGNOSIS — Z6834 Body mass index (BMI) 34.0-34.9, adult: Secondary | ICD-10-CM

## 2023-06-04 DIAGNOSIS — E66812 Obesity, class 2: Secondary | ICD-10-CM

## 2023-06-04 MED ORDER — LOMAIRA 8 MG PO TABS: 12.0000 mg | ORAL_TABLET | Freq: Every day | ORAL | 0 refills | Status: DC

## 2023-06-04 MED ORDER — TOPIRAMATE 50 MG PO TABS: 75.0000 mg | ORAL_TABLET | Freq: Every day | ORAL | 0 refills | Status: DC

## 2023-06-04 NOTE — Assessment & Plan Note (Signed)
On combination lomaira and topiramate.  Starting weight was 213lb at end of May 2024.   And goal for 5% loss was 11lbs.  Patient is now at 183lbs which is a 30 lb loss.  Will send in 2 months worth of refills as patients blood pressure has been controlled and she is not experiencing any side effects.  She has breast reduction surgery scheduled that she is trying to stay mindful of food choices for.

## 2023-06-04 NOTE — Assessment & Plan Note (Signed)
Patient upcoming surgery scheduled for May 28th.  She mentions that she is trying to get her bodyweight and body fat percentage down to have the results she is hoping for.  Follow up with Dr. Kittie Plater office at next scheduled appointment.

## 2023-06-04 NOTE — Progress Notes (Signed)
SUBJECTIVE:  Chief Complaint: Obesity  Interim History: Patient not eating at night.  Food recall is salad and grilled foods.  If she snacks when she is doing work she will eat a frozen meal like the [employer] dinners.  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  Planning for breast reduction on May 28th.  Her goal now is getting her body where she wants it to be before she gets her surgery. She is interested in what her goal weight should be.  D Sue Lush is here to discuss her progress with her obesity treatment plan. She is on the Category 2 Plan and Pescatarian Plan and states she is following her eating plan approximately 75 % of the time. She states she is not exercising, but is walking.   OBJECTIVE: Visit Diagnoses: Problem List Items Addressed This Visit       Other   Iron deficiency anemia due to chronic blood loss   Discussed importance of following up with hematology to see what plan is for her chronic anemia.  Last CBC showing low hemoglobin as well as hematocrit.  Will follow-up on plan at next appointment.      Symptomatic mammary hypertrophy - Primary   Patient upcoming surgery scheduled for May 28th.  She mentions that she is trying to get her bodyweight and body fat percentage down to have the results she is hoping for.  Follow up with Dr. Kittie Plater office at next scheduled appointment.      Disorder of eating   On combination lomaira and topiramate.  Starting weight was 213lb at end of May 2024.   And goal for 5% loss was 11lbs.  Patient is now at 183lbs which is a 30 lb loss.  Will send in 2 months worth of refills as patients blood pressure has been controlled and she is not experiencing any side effects.  She has breast reduction surgery scheduled that she is trying to stay mindful of food choices for.      Relevant Medications   Phentermine HCl (LOMAIRA) 8 MG TABS   topiramate (TOPAMAX) 50 MG tablet    No data  recorded  No data recorded  No data recorded  No data recorded     06/04/2023   12:00 PM 05/13/2023   12:00 PM 04/10/2023    4:00 PM  Vitals with BMI  Height 5\' 1"  5\' 1"  5\' 1"   Weight 183 lbs 187 lbs 189 lbs  BMI 34.6 35.35 35.73  Systolic 120 107 161  Diastolic 79 73 70  Pulse 94 91 88     ASSESSMENT AND PLAN:  Diet: D Sue Lush is currently in the action stage of change. As such, her goal is to continue with weight loss efforts. She has agreed to [employer].  We discussed the importance of ensuring she is getting adequate protein in throughout the day.  Exercise: D Sue Lush has been instructed that some exercise is better than none for weight loss and overall health benefits.   Behavior Modification:  We discussed the following Behavioral Modification Strategies today: increasing lean protein intake, increasing vegetables, no skipping meals, meal planning and cooking strategies, and planning for success. We discussed various medication options to help D Sue Lush with her weight loss efforts and we both agreed to continue current dose of phentermine and topiramate at 12 and 75.  No follow-ups on file.Marland Kitchen She was informed of the importance of frequent follow up visits  to maximize her success with intensive lifestyle modifications for her multiple health conditions.  Attestation Statements:   Reviewed by clinician on day of visit: allergies, medications, problem list, medical history, surgical history, family history, social history, and previous encounter notes.     Reuben Likes, MD

## 2023-06-09 NOTE — Assessment & Plan Note (Signed)
Discussed importance of following up with hematology to see what plan is for her chronic anemia.  Last CBC showing low hemoglobin as well as hematocrit.  Will follow-up on plan at next appointment.

## 2023-06-10 ENCOUNTER — Inpatient Hospital Stay: Payer: Commercial Managed Care - PPO | Attending: Physician Assistant

## 2023-06-10 DIAGNOSIS — D259 Leiomyoma of uterus, unspecified: Secondary | ICD-10-CM | POA: Insufficient documentation

## 2023-06-10 DIAGNOSIS — N840 Polyp of corpus uteri: Secondary | ICD-10-CM | POA: Diagnosis not present

## 2023-06-10 DIAGNOSIS — Z5986 Financial insecurity: Secondary | ICD-10-CM | POA: Insufficient documentation

## 2023-06-10 DIAGNOSIS — D500 Iron deficiency anemia secondary to blood loss (chronic): Secondary | ICD-10-CM

## 2023-06-10 DIAGNOSIS — Z79899 Other long term (current) drug therapy: Secondary | ICD-10-CM | POA: Diagnosis not present

## 2023-06-10 DIAGNOSIS — D509 Iron deficiency anemia, unspecified: Secondary | ICD-10-CM | POA: Diagnosis not present

## 2023-06-10 DIAGNOSIS — Z8349 Family history of other endocrine, nutritional and metabolic diseases: Secondary | ICD-10-CM | POA: Insufficient documentation

## 2023-06-10 DIAGNOSIS — Z8249 Family history of ischemic heart disease and other diseases of the circulatory system: Secondary | ICD-10-CM | POA: Diagnosis not present

## 2023-06-10 DIAGNOSIS — Z86018 Personal history of other benign neoplasm: Secondary | ICD-10-CM | POA: Insufficient documentation

## 2023-06-10 LAB — CBC WITH DIFFERENTIAL (CANCER CENTER ONLY)
Abs Immature Granulocytes: 0.01 10*3/uL (ref 0.00–0.07)
Basophils Absolute: 0.1 10*3/uL (ref 0.0–0.1)
Basophils Relative: 1 %
Eosinophils Absolute: 0.1 10*3/uL (ref 0.0–0.5)
Eosinophils Relative: 2 %
HCT: 36 % (ref 36.0–46.0)
Hemoglobin: 12 g/dL (ref 12.0–15.0)
Immature Granulocytes: 0 %
Lymphocytes Relative: 43 %
Lymphs Abs: 2.7 10*3/uL (ref 0.7–4.0)
MCH: 31.3 pg (ref 26.0–34.0)
MCHC: 33.3 g/dL (ref 30.0–36.0)
MCV: 93.8 fL (ref 80.0–100.0)
Monocytes Absolute: 0.4 10*3/uL (ref 0.1–1.0)
Monocytes Relative: 6 %
Neutro Abs: 3 10*3/uL (ref 1.7–7.7)
Neutrophils Relative %: 48 %
Platelet Count: 342 10*3/uL (ref 150–400)
RBC: 3.84 MIL/uL — ABNORMAL LOW (ref 3.87–5.11)
RDW: 13.2 % (ref 11.5–15.5)
WBC Count: 6.3 10*3/uL (ref 4.0–10.5)
nRBC: 0 % (ref 0.0–0.2)

## 2023-06-10 LAB — IRON AND IRON BINDING CAPACITY (CC-WL,HP ONLY)
Iron: 43 ug/dL (ref 28–170)
Saturation Ratios: 13 % (ref 10.4–31.8)
TIBC: 321 ug/dL (ref 250–450)
UIBC: 278 ug/dL (ref 148–442)

## 2023-06-10 LAB — FERRITIN: Ferritin: 32 ng/mL (ref 11–307)

## 2023-06-11 ENCOUNTER — Inpatient Hospital Stay: Payer: Commercial Managed Care - PPO

## 2023-06-11 ENCOUNTER — Inpatient Hospital Stay (HOSPITAL_BASED_OUTPATIENT_CLINIC_OR_DEPARTMENT_OTHER): Payer: Commercial Managed Care - PPO | Admitting: Physician Assistant

## 2023-06-11 VITALS — BP 117/60 | HR 90 | Temp 98.3°F | Resp 16 | Wt 189.2 lb

## 2023-06-11 DIAGNOSIS — D500 Iron deficiency anemia secondary to blood loss (chronic): Secondary | ICD-10-CM

## 2023-06-11 DIAGNOSIS — Z8349 Family history of other endocrine, nutritional and metabolic diseases: Secondary | ICD-10-CM | POA: Diagnosis not present

## 2023-06-11 DIAGNOSIS — Z79899 Other long term (current) drug therapy: Secondary | ICD-10-CM | POA: Diagnosis not present

## 2023-06-11 DIAGNOSIS — D509 Iron deficiency anemia, unspecified: Secondary | ICD-10-CM | POA: Diagnosis not present

## 2023-06-11 DIAGNOSIS — Z8249 Family history of ischemic heart disease and other diseases of the circulatory system: Secondary | ICD-10-CM | POA: Diagnosis not present

## 2023-06-11 DIAGNOSIS — Z5986 Financial insecurity: Secondary | ICD-10-CM | POA: Diagnosis not present

## 2023-06-11 DIAGNOSIS — D259 Leiomyoma of uterus, unspecified: Secondary | ICD-10-CM | POA: Diagnosis not present

## 2023-06-11 DIAGNOSIS — Z86018 Personal history of other benign neoplasm: Secondary | ICD-10-CM | POA: Diagnosis not present

## 2023-06-11 DIAGNOSIS — N840 Polyp of corpus uteri: Secondary | ICD-10-CM | POA: Diagnosis not present

## 2023-06-11 NOTE — Progress Notes (Signed)
 [facility] [phone]   [phone]  PROGRESS NOTE  Patient Care Team: Wendolyn Jenkins Jansky, MD as PCP - General (Family Medicine)   Hematological/Oncological History 1) Labs from PCP, Dr. [name]: -06/21/2021: WBC 5.5, Hgb 8.6 (L), MCV 77 (L), Plt 356K, Vitamin B12 674, Folate 11.1  2) 07/11/2021: Establish care with [facility] Hematology/Oncology  3) 07/21/2021: Received IV monoferric  1000 mg x 1 dose  4) 12/19/2021-01/16/2022: Received IV venofer  200 mg x 5 doses  5)  10/06/2022-10/27/2022: Received IV venofer  200 mg x 5 doses  6) 11/02/2022: Underwent RFA of uterine fibroids and resection of endometrial polyps, Paige&C   CHIEF COMPLAINTS/PURPOSE OF CONSULTATION:  Iron  deficiency anemia  HISTORY OF PRESENTING ILLNESS:  Paige Fritz 33 y.o. female returns for a follow up for iron  deficiency anemia. She was last seen on 11/30/2022 and in the interim, she denies any changes to her health.   On exam today, Paige Fritz reports her energy levels have been stable and she is able to complete her ADLs on her own. She has a good appetite without any dietary restrictions. She reports her menstrual cycles have improved since undergoing RFA of her uterine fibroids and removal of endometrial polyps in June 2024. Currently she has a monthly menstrual cycle that last 5 days with 1-2 days of heavy bleeding. She has no other signs of bleeding. She is otherwise feeling well without fevers, chills, night sweats, shortness of breath, chest pain, cough, nausea, vomiting, diarrhea or constipation.  She has no other complaints.  Rest of the 10 point ROS is below.  MEDICAL HISTORY:  Past Medical History:  Diagnosis Date   Anxiety    Chronic back pain    per pt due to large breast   Chronic headaches    per pt due to back pain   Intramural and subserous leiomyoma of uterus    Iron  deficiency anemia due to chronic blood loss 2015   hematology/ oncologist--- johnston dickens PA;   treated w/ iron  infusions   Menorrhagia with regular cycle    [name]  deficiency     SURGICAL HISTORY: Past Surgical History:  Procedure Laterality Date   HYSTEROSCOPY WITH Paige & C  11/02/2022   Procedure: DILATATION AND CURETTAGE /HYSTEROSCOPY;  Surgeon: Rutherford Gain, MD;  Location: [facility];  Service: Gynecology;;   NO PAST SURGERIES     WISDOM TOOTH EXTRACTION  2019    SOCIAL HISTORY: Social History   Socioeconomic History   Marital status: Single    Spouse name: Not on file   Number of children: 0   Years of education: Not on file   Highest education level: Associate degree: academic program  Occupational History   Occupation: CNA, Associate Professor, Tax Adviser, Consulting Civil Engineer   Occupation: Copywriter, Advertising:   Tobacco Use   Smoking status: Never   Smokeless tobacco: Never  Vaping Use   Vaping status: Never Used  Substance and Sexual Activity   Alcohol use: Yes    Comment: occasional   Drug use: Never   Sexual activity: Yes    Birth control/protection: None  Other Topics Concern   Not on file  Social History Narrative   Not on file   Social Drivers of Health   Financial Resource Strain: High Risk (08/15/2022)   Overall Financial Resource Strain (CARDIA)    Difficulty of Paying Living Expenses: Hard  Food Insecurity: Food Insecurity Present (08/15/2022)   Hunger  Vital Sign    Worried About Programme Researcher, Broadcasting/film/video in the Last Year: Never true    Ran Out of Food in the Last Year: Sometimes true  Transportation Needs: No Transportation Needs (08/15/2022)   PRAPARE - Administrator, Civil Service (Medical): No    Lack of Transportation (Non-Medical): No  Physical Activity: Insufficiently Active (08/15/2022)   Exercise Vital Sign    Days of Exercise per Week: 2 days    Minutes of Exercise per Session: 20 min  Stress: Stress Concern Present (08/15/2022)   [employer] of Occupational Health -  Occupational Stress Questionnaire    Feeling of Stress : To some extent  Social Connections: Socially Integrated (08/15/2022)   Social Connection and Isolation Panel [NHANES]    Frequency of Communication with Friends and Family: More than three times a week    Frequency of Social Gatherings with Friends and Family: Once a week    Attends Religious Services: More than 4 times per year    Active Member of [employer] or Organizations: Yes    Attends Engineer, Structural: More than 4 times per year    Marital Status: Living with partner  Intimate Partner Violence: Not on file    FAMILY HISTORY: Family History  Problem Relation Age of Onset   Obesity Mother    Hypertension Father     ALLERGIES:  has no known allergies.  MEDICATIONS:  Current Outpatient Medications  Medication Sig Dispense Refill   ibuprofen  (ADVIL ) 800 MG tablet Take 800 mg by mouth every 8 (eight) hours as needed.     Misc Natural Products (PCN-200 PO) Take 500 mg by mouth 4 (four) times daily.     Phentermine  HCl (LOMAIRA ) 8 MG TABS Take 1.5 tablets (12 mg total) by mouth daily. 45 tablet 0   Phentermine  HCl (LOMAIRA ) 8 MG TABS Take 1.5 tablets (12 mg total) by mouth daily. 45 tablet 0   simvastatin  (ZOCOR ) 20 MG tablet Take 1 tablet (20 mg total) by mouth at bedtime. 90 tablet 0   topiramate  (TOPAMAX ) 50 MG tablet Take 1.5 tablets (75 mg total) by mouth daily. 90 tablet 0   triamcinolone  cream (KENALOG ) 0.1 % Apply 1 Application topically 2 (two) times daily. (Patient taking differently: Apply 1 Application topically 2 (two) times daily as needed.) 30 g 0   [name] , Ergocalciferol , (DRISDOL ) 1.25 MG (50000 UNIT) CAPS capsule Take 1 capsule (50,000 Units total) by mouth every 7 (seven) days. 4 capsule 0   No current facility-administered medications for this visit.    REVIEW OF SYSTEMS:   Constitutional: ( - ) fevers, ( - )  chills , ( - ) night sweats Eyes: ( - ) blurriness of vision, ( - ) double vision, (  - ) watery eyes Ears, nose, mouth, throat, and face: ( - ) mucositis, ( - ) sore throat Respiratory: ( - ) cough, ( -) dyspnea, ( - ) wheezes Cardiovascular: ( - ) palpitation, ( - ) chest discomfort, ( - ) lower extremity swelling Gastrointestinal:  ( - ) nausea, ( - ) heartburn, ( - ) change in bowel habits Skin: ( - ) abnormal skin rashes Lymphatics: ( - ) new lymphadenopathy, ( - ) easy bruising Neurological: ( - ) numbness, ( - ) tingling, ( - ) new weaknesses Behavioral/Psych: ( - ) mood change, ( - ) new changes  All other systems were reviewed with the patient and are negative.  PHYSICAL EXAMINATION: Constitutional:  Oriented to person, place, and time and well-developed, well-nourished, and in no distress.  HENT:  Head: Normocephalic and atraumatic.  Cardiovascular: Normal rate, regular rhythm, normal heart sounds   Pulmonary/Chest: Effort normal and breath sounds normal. No respiratory distress. No wheezes. No rales.  Musculoskeletal: Normal range of motion. Exhibits no edema.  Neurological: Alert and oriented to person, place, and time. Exhibits normal muscle tone. Gait normal. Coordination normal.  Skin: Skin is warm and dry. No rash noted. Not diaphoretic. No erythema. No pallor.  Psychiatric: Mood, memory and judgment normal.    LABORATORY DATA:  I have reviewed the data as listed    Latest Ref Rng & Units 06/10/2023   12:54 PM 02/28/2023   12:47 PM 11/30/2022    9:00 AM  CBC  WBC 4.0 - 10.5 K/uL 6.3  5.6  4.6   Hemoglobin 12.0 - 15.0 g/dL 87.9  88.0  87.6   Hematocrit 36.0 - 46.0 % 36.0  34.0  36.3   Platelets 150 - 400 K/uL 342  358  321        Latest Ref Rng & Units 11/16/2022    3:55 PM 06/22/2022    2:23 PM 03/16/2022    3:35 PM  CMP  Glucose 65 - 99 mg/dL 78  84  86   BUN 7 - 25 mg/dL 12  11  9    Creatinine 0.50 - 0.97 mg/dL 9.20  9.28  9.31   Sodium 135 - 146 mmol/L 134  137  135   Potassium 3.5 - 5.3 mmol/L 3.8  3.9  3.7   Chloride 98 - 110 mmol/L 104   104  103   CO2 20 - 32 mmol/L 21  28  27    Calcium 8.6 - 10.2 mg/dL 9.1  9.3  9.4   Total Protein 6.1 - 8.1 g/dL 7.0  7.4  8.1   Total Bilirubin 0.2 - 1.2 mg/dL 0.3  0.3  0.3   Alkaline Phos 38 - 126 U/L  49  48   AST 10 - 30 U/L 14  13  13    ALT 6 - 29 U/L 11  8  8      ASSESSMENT & PLAN Paige Fritz is a 33 y.o. female returns for a follow up for iron  deficiency anemia.  #Iron  deficiency anemia 2/2 heavy menstrual bleeding --Unable to tolerate iron  tablets due to constipation --Underwent RFA of uterine fibroids and resection of endometrial polyps, Paige&C on 11/02/2022. --Last received IV venofer  200 mg x 5 doses from 10/06/2022-11/03/2022 --Labs from 06/10/23 showed no evidence of anemia with Hgb of 12.0. Iron  panel shows borderline low deficiency with ferritin 32, iron  saturation 13%, serum rion 43, TIBC 321 --Recommend IV iron  to improve iron  levels to prevent future anemia. Will attempt for IV feraheme  510 mg x 1 dose versus IV venofer  200 mg x 3 doses. --RTC in 3 months for labs only and 6 months with labs/follow up  Orders Placed This Encounter  Procedures   CBC with Differential (Cancer Center Only)    Standing Status:   Future    Expected Date:   09/09/2023    Expiration Date:   06/11/2024   Iron  and Iron  Binding Capacity ([facility] only)    Standing Status:   Future    Expected Date:   09/09/2023    Expiration Date:   06/11/2024   Ferritin    Standing Status:   Future    Expected Date:   09/09/2023  Expiration Date:   06/11/2024    All questions were answered. The patient knows to call the clinic with any problems, questions or concerns.  I have spent a total of 30 minutes minutes of face-to-face and non-face-to-face time, preparing to see the patient, performing a medically appropriate examination, counseling and educating the patient,  documenting clinical information in the electronic health record,and care coordination.    Johnston Police, PA-C Department of  Hematology/Oncology [facility] Phone: [phone]

## 2023-06-12 ENCOUNTER — Encounter: Payer: Self-pay | Admitting: Physician Assistant

## 2023-06-15 ENCOUNTER — Inpatient Hospital Stay: Payer: Commercial Managed Care - PPO

## 2023-06-15 VITALS — BP 127/84 | HR 97 | Temp 98.2°F | Resp 17

## 2023-06-15 DIAGNOSIS — Z79899 Other long term (current) drug therapy: Secondary | ICD-10-CM | POA: Diagnosis not present

## 2023-06-15 DIAGNOSIS — Z86018 Personal history of other benign neoplasm: Secondary | ICD-10-CM | POA: Diagnosis not present

## 2023-06-15 DIAGNOSIS — Z8249 Family history of ischemic heart disease and other diseases of the circulatory system: Secondary | ICD-10-CM | POA: Diagnosis not present

## 2023-06-15 DIAGNOSIS — D500 Iron deficiency anemia secondary to blood loss (chronic): Secondary | ICD-10-CM

## 2023-06-15 DIAGNOSIS — D259 Leiomyoma of uterus, unspecified: Secondary | ICD-10-CM | POA: Diagnosis not present

## 2023-06-15 DIAGNOSIS — Z5986 Financial insecurity: Secondary | ICD-10-CM | POA: Diagnosis not present

## 2023-06-15 DIAGNOSIS — D509 Iron deficiency anemia, unspecified: Secondary | ICD-10-CM | POA: Diagnosis not present

## 2023-06-15 DIAGNOSIS — Z8349 Family history of other endocrine, nutritional and metabolic diseases: Secondary | ICD-10-CM | POA: Diagnosis not present

## 2023-06-15 DIAGNOSIS — N840 Polyp of corpus uteri: Secondary | ICD-10-CM | POA: Diagnosis not present

## 2023-06-15 MED ORDER — SODIUM CHLORIDE 0.9 % IV SOLN
510.0000 mg | Freq: Once | INTRAVENOUS | Status: DC
  Filled 2023-06-15: qty 17

## 2023-06-15 MED ORDER — SODIUM CHLORIDE 0.9 % IV SOLN: INTRAVENOUS | Status: DC

## 2023-06-21 ENCOUNTER — Encounter: Payer: Self-pay | Admitting: Family Medicine

## 2023-06-21 ENCOUNTER — Ambulatory Visit: Payer: Commercial Managed Care - PPO | Admitting: Family Medicine

## 2023-06-21 VITALS — BP 110/76 | HR 92 | Temp 97.8°F | Resp 18 | Ht 61.0 in | Wt 185.1 lb

## 2023-06-21 DIAGNOSIS — L720 Epidermal cyst: Secondary | ICD-10-CM | POA: Diagnosis not present

## 2023-06-21 DIAGNOSIS — E7800 Pure hypercholesterolemia, unspecified: Secondary | ICD-10-CM

## 2023-06-21 DIAGNOSIS — Z0000 Encounter for general adult medical examination without abnormal findings: Secondary | ICD-10-CM | POA: Diagnosis not present

## 2023-06-21 DIAGNOSIS — E559 Vitamin D deficiency, unspecified: Secondary | ICD-10-CM

## 2023-06-21 LAB — HEMOGLOBIN A1C: Hgb A1c MFr Bld: 5 % (ref 4.6–6.5)

## 2023-06-21 LAB — COMPREHENSIVE METABOLIC PANEL
ALT: 7 U/L (ref 0–35)
AST: 13 U/L (ref 0–37)
Albumin: 4.1 g/dL (ref 3.5–5.2)
Alkaline Phosphatase: 56 U/L (ref 39–117)
BUN: 11 mg/dL (ref 6–23)
CO2: 25 meq/L (ref 19–32)
Calcium: 9 mg/dL (ref 8.4–10.5)
Chloride: 104 meq/L (ref 96–112)
Creatinine, Ser: 0.84 mg/dL (ref 0.40–1.20)
GFR: 91.57 mL/min (ref >60.00)
Glucose, Bld: 85 mg/dL (ref 70–99)
Potassium: 4.5 meq/L (ref 3.5–5.1)
Sodium: 135 meq/L (ref 135–145)
Total Bilirubin: 0.9 mg/dL (ref 0.2–1.2)
Total Protein: 7.5 g/dL (ref 6.0–8.3)

## 2023-06-21 LAB — LIPID PANEL
Cholesterol: 210 mg/dL — ABNORMAL HIGH (ref 0–200)
HDL: 62.9 mg/dL (ref >39.00)
LDL Cholesterol: 137 mg/dL — ABNORMAL HIGH (ref 0–99)
NonHDL: 147.56
Total CHOL/HDL Ratio: 3
Triglycerides: 52 mg/dL (ref 0.0–149.0)
VLDL: 10.4 mg/dL (ref 0.0–40.0)

## 2023-06-21 LAB — TSH: TSH: 2.03 u[IU]/mL (ref 0.35–5.50)

## 2023-06-21 LAB — VITAMIN D 25 HYDROXY (VIT D DEFICIENCY, FRACTURES): VITD: 31.24 ng/mL (ref 30.00–100.00)

## 2023-06-21 NOTE — Patient Instructions (Signed)

## 2023-06-21 NOTE — Progress Notes (Signed)
Phone [phone]   Subjective:   Patient is a 33 y.o. female presenting for annual physical.    Chief Complaint  Patient presents with   Annual Exam    CPE Fasting Pap appointment March 3   Annual-sees Dr. Cherly Hensen for gyn  See problem oriented charting- ROS- ROS: Gen: no fever, chills  Skin: no rash, itching.  + cyst upper back that drains at times ENT: no ear pain, ear drainage, nasal congestion, rhinorrhea, sinus pressure, sore throat Eyes: no, double vision.  Did have episode of blurry vision for few seconds.  No ha after. Resp: no cough, wheeze,SOB CV: no CP, palpitations, LE edema,  GI: no heartburn, n/v/d/c, abd pain GU: no dysuria, urgency, frequency, hematuria MSK: no joint pain, myalgias, back pain Neuro: no dizziness, headache, weakness, vertigo Psych: no depression, anxiety, insomnia, SI   The following were reviewed and entered/updated in epic: Past Medical History:  Diagnosis Date   Anxiety    Chronic back pain    per pt due to large breast   Chronic headaches    per pt due to back pain   Intramural and subserous leiomyoma of uterus    Iron deficiency anemia due to chronic blood loss 2015   hematology/ oncologist--- David Stall PA;   treated w/ iron infusions   Menorrhagia with regular cycle    Vitamin D deficiency    Patient Active Problem List   Diagnosis Date Noted   Disorder of eating 04/10/2023   Vitamin D deficiency 04/10/2023   Neck pain 01/22/2023   Pure hypercholesterolemia 11/16/2022   Symptomatic mammary hypertrophy 08/14/2022   Back pain 08/14/2022   Microcytic anemia 07/11/2021   Iron deficiency anemia 07/11/2021   Iron deficiency anemia due to chronic blood loss 07/11/2021   Past Surgical History:  Procedure Laterality Date   HYSTEROSCOPY WITH D & C  11/02/2022   Procedure: DILATATION AND CURETTAGE /HYSTEROSCOPY;  Surgeon: Maxie Better, MD;  Location: [facility];  Service: Gynecology;;   NO PAST SURGERIES      WISDOM TOOTH EXTRACTION  2019    Family History  Problem Relation Age of Onset   Obesity Mother    Hypertension Father     Medications- reviewed and updated Current Outpatient Medications  Medication Sig Dispense Refill   ibuprofen (ADVIL) 800 MG tablet Take 800 mg by mouth every 8 (eight) hours as needed.     Misc Natural Products (PCN-200 PO) Take 500 mg by mouth 4 (four) times daily.     Phentermine HCl (LOMAIRA) 8 MG TABS Take 1.5 tablets (12 mg total) by mouth daily. 45 tablet 0   simvastatin (ZOCOR) 20 MG tablet Take 1 tablet (20 mg total) by mouth at bedtime. 90 tablet 0   topiramate (TOPAMAX) 50 MG tablet Take 1.5 tablets (75 mg total) by mouth daily. 90 tablet 0   triamcinolone cream (KENALOG) 0.1 % Apply 1 Application topically 2 (two) times daily. (Patient taking differently: Apply 1 Application topically 2 (two) times daily as needed.) 30 g 0   Vitamin D, Ergocalciferol, (DRISDOL) 1.25 MG (50000 UNIT) CAPS capsule Take 1 capsule (50,000 Units total) by mouth every 7 (seven) days. 4 capsule 0   No current facility-administered medications for this visit.    Allergies-reviewed and updated No Known Allergies  Social History   Social History Narrative   Not on file   Objective  Objective:  BP 110/76   Pulse 92   Temp 97.8 F (36.6 C) (Temporal)  Resp 18   Ht 5\' 1"  (1.549 m)   Wt 185 lb 2 oz (84 kg)   LMP 05/29/2023 (Exact Date)   SpO2 100%   BMI 34.98 kg/m  Physical Exam  Gen: WDWN NAD HEENT: NCAT, conjunctiva not injected, sclera nonicteric TM WNL B, OP moist, no exudates  NECK:  supple, no thyromegaly, no nodes, no carotid bruits CARDIAC: RRR, S1S2+, no murmur. DP 2+B LUNGS: CTAB. No wheezes ABDOMEN:  BS+, soft, NTND, No HSM, no masses EXT:  no edema MSK: no gross abnormalities. MS 5/5 all 4 NEURO: A&O x3.  CN II-XII intact.  PSYCH: normal mood. Good eye contact   Epidermoid cyst upper back L above bra line     Assessment and Plan    Health Maintenance counseling: 1. Anticipatory guidance: Patient counseled regarding regular dental exams q6 months, eye exams,  avoiding smoking and second hand smoke, limiting alcohol to 1 beverage per day, no illicit drugs.   2. Risk factor reduction:  Advised patient of need for regular exercise and diet rich and fruits and vegetables to reduce risk of heart attack and stroke. Exercise- +.  Wt Readings from Last 3 Encounters:  06/21/23 185 lb 2 oz (84 kg)  06/11/23 189 lb 3.2 oz (85.8 kg)  06/04/23 183 lb (83 kg)   3. Immunizations/screenings/ancillary studies Immunization History  Administered Date(s) Administered   Influenza Inj Mdck Quad Pf 02/01/2022   Influenza-Unspecified 02/19/2023   PFIZER(Purple Top)SARS-COV-2 Vaccination 01/06/2020, 02/05/2020   There are no preventive care reminders to display for this patient.   4. Cervical cancer screening: gyn 5. Skin cancer screening- advised regular sunscreen use. Denies worrisome, changing, or new skin lesions.  6. Birth control/STD check: n/a 7. Smoking associated screening: non smoker 8. Alcohol screening: neg  Wellness examination -     Comprehensive metabolic panel -     Lipid panel -     TSH -     Hemoglobin A1c  Pure hypercholesterolemia Assessment & Plan: Chronic.  Not well controlled.  Pt on zocor 20mg .  Check labs.  Managed by weight/wellness center  Orders: -     Lipid panel  Epidermoid cyst of skin of back -     Ambulatory referral to Dermatology  Vitamin D deficiency Assessment & Plan: Chronic.  ? Control.  Managed by weight and wellness.  Check labs  Orders: -     VITAMIN D 25 Hydroxy (Vit-D Deficiency, Fractures)  Wellness-anticipatory guidance.  Work on Diet/Exercise  Check CBC,CMP,lipids,TSH, A1C.  F/u 1 yr  Epidermoid cyst-intermitt rupture but keeps returning.  Refer derm  Recommended follow up: Return in about 1 year (around 06/20/2024) for annual physical.  Lab/Order associations:+  fasting   Paige Sole, MD

## 2023-06-22 NOTE — Assessment & Plan Note (Signed)
Chronic.  Not well controlled.  Pt on zocor 20mg .  Check labs.  Managed by weight/wellness center

## 2023-06-22 NOTE — Assessment & Plan Note (Signed)
Chronic.  ? Control.  Managed by weight and wellness.  Check labs

## 2023-06-23 ENCOUNTER — Encounter: Payer: Self-pay | Admitting: Family Medicine

## 2023-06-23 NOTE — Progress Notes (Signed)
Labs great except cholesterol could be better.  Since wellness is managing her zocor-she can discuss plan w/them

## 2023-06-27 ENCOUNTER — Other Ambulatory Visit: Payer: Self-pay | Admitting: Physician Assistant

## 2023-06-28 MED FILL — Ferumoxytol Inj 510 MG/17ML (30 MG/ML) (Elemental Fe): INTRAVENOUS | Qty: 17 | Status: AC

## 2023-06-29 ENCOUNTER — Inpatient Hospital Stay: Payer: Commercial Managed Care - PPO

## 2023-06-29 VITALS — BP 115/70 | HR 89 | Temp 98.2°F | Resp 16

## 2023-06-29 DIAGNOSIS — Z8349 Family history of other endocrine, nutritional and metabolic diseases: Secondary | ICD-10-CM | POA: Diagnosis not present

## 2023-06-29 DIAGNOSIS — Z8249 Family history of ischemic heart disease and other diseases of the circulatory system: Secondary | ICD-10-CM | POA: Diagnosis not present

## 2023-06-29 DIAGNOSIS — D509 Iron deficiency anemia, unspecified: Secondary | ICD-10-CM | POA: Diagnosis not present

## 2023-06-29 DIAGNOSIS — Z86018 Personal history of other benign neoplasm: Secondary | ICD-10-CM | POA: Diagnosis not present

## 2023-06-29 DIAGNOSIS — D500 Iron deficiency anemia secondary to blood loss (chronic): Secondary | ICD-10-CM

## 2023-06-29 DIAGNOSIS — D259 Leiomyoma of uterus, unspecified: Secondary | ICD-10-CM | POA: Diagnosis not present

## 2023-06-29 DIAGNOSIS — N840 Polyp of corpus uteri: Secondary | ICD-10-CM | POA: Diagnosis not present

## 2023-06-29 DIAGNOSIS — Z79899 Other long term (current) drug therapy: Secondary | ICD-10-CM | POA: Diagnosis not present

## 2023-06-29 DIAGNOSIS — Z5986 Financial insecurity: Secondary | ICD-10-CM | POA: Diagnosis not present

## 2023-06-29 MED ORDER — SODIUM CHLORIDE 0.9 % IV SOLN: Freq: Once | INTRAVENOUS | Status: AC

## 2023-06-29 MED ORDER — SODIUM CHLORIDE 0.9 % IV SOLN
510.0000 mg | Freq: Once | INTRAVENOUS | Status: AC
  Administered 2023-06-29: 510 mg via INTRAVENOUS
  Filled 2023-06-29: qty 17

## 2023-06-29 NOTE — Progress Notes (Signed)
 Pt stayed approximately 15 min post iron infusion. Tolerated well. VSS at time of discharge. Ambulated independently to lobby for d/c. Declined AVS.

## 2023-06-29 NOTE — Patient Instructions (Signed)

## 2023-07-08 DIAGNOSIS — Z01419 Encounter for gynecological examination (general) (routine) without abnormal findings: Secondary | ICD-10-CM | POA: Diagnosis not present

## 2023-07-08 DIAGNOSIS — N870 Mild cervical dysplasia: Secondary | ICD-10-CM | POA: Diagnosis not present

## 2023-07-08 DIAGNOSIS — R87612 Low grade squamous intraepithelial lesion on cytologic smear of cervix (LGSIL): Secondary | ICD-10-CM | POA: Diagnosis not present

## 2023-07-30 ENCOUNTER — Encounter (INDEPENDENT_AMBULATORY_CARE_PROVIDER_SITE_OTHER): Payer: Self-pay | Admitting: Family Medicine

## 2023-07-30 ENCOUNTER — Ambulatory Visit (INDEPENDENT_AMBULATORY_CARE_PROVIDER_SITE_OTHER): Payer: Commercial Managed Care - PPO | Admitting: Family Medicine

## 2023-07-30 VITALS — BP 101/68 | HR 94 | Temp 98.4°F | Ht 61.0 in | Wt 180.0 lb

## 2023-07-30 DIAGNOSIS — E559 Vitamin D deficiency, unspecified: Secondary | ICD-10-CM

## 2023-07-30 DIAGNOSIS — Z6834 Body mass index (BMI) 34.0-34.9, adult: Secondary | ICD-10-CM | POA: Diagnosis not present

## 2023-07-30 DIAGNOSIS — K5909 Other constipation: Secondary | ICD-10-CM

## 2023-07-30 DIAGNOSIS — F5089 Other specified eating disorder: Secondary | ICD-10-CM | POA: Diagnosis not present

## 2023-07-30 DIAGNOSIS — E669 Obesity, unspecified: Secondary | ICD-10-CM | POA: Diagnosis not present

## 2023-07-30 MED ORDER — LOMAIRA 8 MG PO TABS: 12.0000 mg | ORAL_TABLET | Freq: Every day | ORAL | 0 refills | Status: DC

## 2023-07-30 MED ORDER — POLYETHYLENE GLYCOL 3350 17 GM/SCOOP PO POWD: 17.0000 g | Freq: Every day | ORAL | Status: AC

## 2023-07-30 MED ORDER — VITAMIN D (ERGOCALCIFEROL) 1.25 MG (50000 UNIT) PO CAPS: 50000.0000 [IU] | ORAL_CAPSULE | ORAL | 0 refills | Status: DC

## 2023-07-30 MED ORDER — TOPIRAMATE 50 MG PO TABS
75.0000 mg | ORAL_TABLET | Freq: Every day | ORAL | 0 refills | Status: DC
Start: 2023-07-30 — End: 2023-09-10

## 2023-07-30 NOTE — Progress Notes (Signed)
 SUBJECTIVE:  Chief Complaint: Obesity  Interim History: patient is still having GI issues of constipation.  Last normal bowel movement was 4 days ago.  Patient went to [place] for her birthday and did a self care day.  Prior to getting sick she had gotten and iron infusion.  She has been trying to incorporate more red meat and vegetables to help with her iron.   Paige Fritz is here to discuss her progress with her obesity treatment plan. She is on the Category 2 Plan and Pescatarian Plan and states she is following her eating plan approximately 50 % of the time. She states she is exercising, walking at work at least 7,000 steps also walking 3 miles for 60 minutes 2-3 times per week.   OBJECTIVE: Visit Diagnoses: Problem List Items Addressed This Visit       Other   Disorder of eating - Primary   On combination lomaira and topiramate.  Starting weight was 213lb at end of May 2024.   And goal for 5% loss was 11lbs.  Patient is now at 180lbs which is a 33 lb loss.  She has breast reduction surgery scheduled that she is trying to stay mindful of food choices for.  Refill 1 month of prescriptions.      Relevant Medications   Phentermine HCl (LOMAIRA) 8 MG TABS   topiramate (TOPAMAX) 50 MG tablet   [name] deficiency   On prescription strength [name] and doing well.  No nausea, vomiting or muscle weakness.  Refill needed today.        Relevant Medications   [name], Ergocalciferol, (DRISDOL) 1.25 MG (50000 UNIT) CAPS capsule   Other constipation   Patient is experiencing less frequent bowel movements and having to strain more frequently.  She was encouraged to start taking Miralax daily.      Relevant Medications   polyethylene glycol powder (GLYCOLAX/MIRALAX) 17 GM/SCOOP powder   Other Visit Diagnoses       Obesity with starting BMI of 36.3       Relevant Medications   Phentermine HCl (LOMAIRA) 8 MG TABS     BMI 34.0-34.9,adult           No data recorded No data  recorded  No data recorded No data recorded    07/30/2023   12:00 PM 06/29/2023   10:02 AM 06/29/2023    8:31 AM  Vitals with BMI  Height 5\' 1"     Weight 180 lbs    BMI 34.03    Systolic 101 115 295  Diastolic 68 70 86  Pulse 94 89 89     ASSESSMENT AND PLAN:  Diet: Paige Fritz is currently in the action stage of change. As such, her goal is to continue with weight loss efforts and has agreed to the Category 2 Plan and the Pescatarian Plan.   Exercise:  For substantial health benefits, adults should do at least 150 minutes (2 hours and 30 minutes) a week of moderate-intensity, or 75 minutes (1 hour and 15 minutes) a week of vigorous-intensity aerobic physical activity, or an equivalent combination of moderate- and vigorous-intensity aerobic activity. Aerobic activity should be performed in episodes of at least 10 minutes, and preferably, it should be spread throughout the week.  Behavior Modification:  We discussed the following Behavioral Modification Strategies today: increasing lean protein intake, increasing vegetables, meal planning and cooking strategies, better snacking choices, avoiding temptations, and planning for success. We discussed various medication options to help Paige  Sue Fritz with her weight loss efforts and we both agreed to continue current prescriptions at same dose.  Return in about 6 weeks (around 09/10/2023).Marland Kitchen She was informed of the importance of frequent follow up visits to maximize her success with intensive lifestyle modifications for her multiple health conditions.  Attestation Statements:   Reviewed by clinician on day of visit: allergies, medications, problem list, medical history, surgical history, family history, social history, and previous encounter notes.  Reuben Likes, MD

## 2023-08-14 DIAGNOSIS — K5909 Other constipation: Secondary | ICD-10-CM | POA: Insufficient documentation

## 2023-08-14 NOTE — Assessment & Plan Note (Signed)
 On prescription strength Vitamin D and doing well.  No nausea, vomiting or muscle weakness.  Refill needed today.

## 2023-08-14 NOTE — Assessment & Plan Note (Signed)
 Patient is experiencing less frequent bowel movements and having to strain more frequently.  She was encouraged to start taking Miralax daily.

## 2023-08-14 NOTE — Assessment & Plan Note (Signed)
 On combination lomaira and topiramate.  Starting weight was 213lb at end of May 2024.   And goal for 5% loss was 11lbs.  Patient is now at 180lbs which is a 33 lb loss.  She has breast reduction surgery scheduled that she is trying to stay mindful of food choices for.  Refill 1 month of prescriptions.

## 2023-09-04 ENCOUNTER — Ambulatory Visit (INDEPENDENT_AMBULATORY_CARE_PROVIDER_SITE_OTHER): Payer: Commercial Managed Care - PPO | Admitting: Physician Assistant

## 2023-09-04 VITALS — BP 110/74 | HR 102 | Wt 184.0 lb

## 2023-09-04 DIAGNOSIS — N62 Hypertrophy of breast: Secondary | ICD-10-CM

## 2023-09-04 MED ORDER — CEPHALEXIN 500 MG PO CAPS: 500.0000 mg | ORAL_CAPSULE | Freq: Four times a day (QID) | ORAL | 0 refills | Status: AC

## 2023-09-04 MED ORDER — OXYCODONE HCL 5 MG PO TABS: 5.0000 mg | ORAL_TABLET | Freq: Three times a day (TID) | ORAL | 0 refills | Status: AC | PRN

## 2023-09-04 MED ORDER — ONDANSETRON 4 MG PO TBDP: 4.0000 mg | ORAL_TABLET | Freq: Three times a day (TID) | ORAL | 0 refills | Status: DC | PRN

## 2023-09-04 NOTE — Progress Notes (Signed)
 Patient ID: Paige Fritz, female    DOB: January 24, 1991, 33 y.o.   MRN: [id number]  Chief Complaint  Patient presents with   Pre-op Exam      ICD-10-CM   1. Symptomatic mammary hypertrophy  N62        History of Present Illness: Paige Fritz is a 33 y.o.  female  with a history of macromastia.  She presents for preoperative evaluation for upcoming procedure, bilateral breast reduction with possible liposuction, scheduled for 10/03/2023 with Dr. Orin Birk.  The patient has not had problems with anesthesia.  Previous fibroid surgery without complication.  She denies any personal or family history of blood clots or clotting disorder.  She denies any personal history of cancer, significant cardiac or pulmonary disease, varicosities, or nicotine use.  She confirms that she is an H cup and would like to be a C cup postoperatively.  She understands limitations with how much tissue can be safely excised without compromising breast shape or viability of nipples.  She understands to hold her phentermine /Topamax  compound 1 week prior to surgery.  She will also hold any vitamins and supplements 1 week prior to surgery.  Summary of Previous Visit: She was seen for initial consult by Dr. Orin Birk 03/08/2023.  At that time, complained of chronic upper back and neck discomfort in the context of large breasts.  STN measured 32 cm on the right, 35 cm in the left.  Preoperative bra size equals J cup.  She weighed 191 pounds.  Estimated tissue removed at time of surgery equals 825 g each side.  Discussed bilateral breast reduction with liposuction.  Job: Psychologist, sport and exercise at [employer], discussed 4 weeks FMLA/STD.  May need restrictions for an additional 2 weeks upon her return with regard to lifting.  PMH Significant for: Macromastia, obesity on phentermine , iron  deficiency anemia with most recent hemoglobin within normal limits 06/2023, HLD on simvastatin .   Past Medical  History: Allergies: No Known Allergies  Current Medications:  Current Outpatient Medications:    cephALEXin (KEFLEX) 500 MG capsule, Take 1 capsule (500 mg total) by mouth 4 (four) times daily for 3 days., Disp: 12 capsule, Rfl: 0   ibuprofen  (ADVIL ) 800 MG tablet, Take 800 mg by mouth every 8 (eight) hours as needed., Disp: , Rfl:    ondansetron  (ZOFRAN -ODT) 4 MG disintegrating tablet, Take 1 tablet (4 mg total) by mouth every 8 (eight) hours as needed for nausea or vomiting., Disp: 20 tablet, Rfl: 0   oxyCODONE  (ROXICODONE ) 5 MG immediate release tablet, Take 1 tablet (5 mg total) by mouth every 8 (eight) hours as needed for up to 5 days for severe pain (pain score 7-10)., Disp: 15 tablet, Rfl: 0   polyethylene glycol powder (GLYCOLAX /MIRALAX ) 17 GM/SCOOP powder, Take 17 g by mouth daily., Disp: , Rfl:    simvastatin  (ZOCOR ) 20 MG tablet, Take 1 tablet (20 mg total) by mouth at bedtime., Disp: 90 tablet, Rfl: 0   topiramate  (TOPAMAX ) 50 MG tablet, Take 1.5 tablets (75 mg total) by mouth daily., Disp: 90 tablet, Rfl: 0   triamcinolone  cream (KENALOG ) 0.1 %, Apply 1 Application topically 2 (two) times daily. (Patient taking differently: Apply 1 Application topically 2 (two) times daily as needed.), Disp: 30 g, Rfl: 0   [name] , Ergocalciferol , (DRISDOL ) 1.25 MG (50000 UNIT) CAPS capsule, Take 1 capsule (50,000 Units total) by mouth every 7 (seven) days., Disp: 12 capsule, Rfl: 0   Phentermine  HCl (LOMAIRA )  8 MG TABS, Take 1.5 tablets (12 mg total) by mouth daily., Disp: 45 tablet, Rfl: 0  Past Medical Problems: Past Medical History:  Diagnosis Date   Anxiety    Chronic back pain    per pt due to large breast   Chronic headaches    per pt due to back pain   Intramural and subserous leiomyoma of uterus    Iron  deficiency anemia due to chronic blood loss 2015   hematology/ oncologist--- Colie Dawes PA;   treated w/ iron  infusions   Menorrhagia with regular cycle    [name]  deficiency      Past Surgical History: Past Surgical History:  Procedure Laterality Date   HYSTEROSCOPY WITH Paige & C  11/02/2022   Procedure: DILATATION AND CURETTAGE /HYSTEROSCOPY;  Surgeon: Ivery Marking, MD;  Location: [facility];  Service: Gynecology;;   NO PAST SURGERIES     WISDOM TOOTH EXTRACTION  2019    Social History: Social History   Socioeconomic History   Marital status: Single    Spouse name: Not on file   Number of children: 0   Years of education: Not on file   Highest education level: Associate degree: occupational, Scientist, product/process development, or vocational program  Occupational History   Occupation: CNA, Associate Professor, Tax adviser, Consulting civil engineer   Occupation: Copywriter, advertising: [employer]  Tobacco Use   Smoking status: Never   Smokeless tobacco: Never  Vaping Use   Vaping status: Never Used  Substance and Sexual Activity   Alcohol use: Yes    Alcohol/week: 1.0 standard drink of alcohol    Types: 1 Glasses of wine per week    Comment: occasional   Drug use: Never   Sexual activity: Yes    Birth control/protection: None  Other Topics Concern   Not on file  Social History Narrative   Not on file   Social Drivers of Health   Financial Resource Strain: High Risk (06/17/2023)   Overall Financial Resource Strain (CARDIA)    Difficulty of Paying Living Expenses: Hard  Food Insecurity: Food Insecurity Present (06/17/2023)   Hunger Vital Sign    Worried About Running Out of Food in the Last Year: Never true    Ran Out of Food in the Last Year: Often true  Transportation Needs: No Transportation Needs (06/17/2023)   PRAPARE - Administrator, Civil Service (Medical): No    Lack of Transportation (Non-Medical): No  Physical Activity: Sufficiently Active (06/17/2023)   Exercise Vital Sign    Days of Exercise per Week: 7 days    Minutes of Exercise per Session: 30 min  Stress: No Stress Concern Present (06/17/2023)   [employer] of Occupational  Health - Occupational Stress Questionnaire    Feeling of Stress : Only a little  Social Connections: Socially Integrated (06/17/2023)   Social Connection and Isolation Panel [NHANES]    Frequency of Communication with Friends and Family: More than three times a week    Frequency of Social Gatherings with Friends and Family: More than three times a week    Attends Religious Services: More than 4 times per year    Active Member of [employer] or Organizations: Yes    Attends Engineer, structural: More than 4 times per year    Marital Status: Living with partner  Intimate Partner Violence: Not on file    Family History: Family History  Problem Relation Age of Onset   Obesity Mother  Hypertension Father     Review of Systems: ROS Denies any recent chest pain, difficulty breathing, leg swelling, fevers.  Physical Exam: Vital Signs BP 110/74 (BP Location: Left Arm, Patient Position: Sitting, Cuff Size: Normal)   Pulse (!) 102   Wt 184 lb (83.5 kg)   SpO2 99%   BMI 34.77 kg/m   Physical Exam  Constitutional:      General: Not in acute distress.    Appearance: Normal appearance. Not ill-appearing.  HENT:     Head: Normocephalic and atraumatic.  Eyes:     Pupils: Pupils are equal, round. Cardiovascular:     Rate and Rhythm: Normal rate.    Pulses: Normal pulses.  Pulmonary:     Effort: No respiratory distress or increased work of breathing.  Speaks in full sentences. Abdominal:     General: Abdomen is flat. No distension.   Musculoskeletal: Normal range of motion. No lower extremity swelling or edema. No varicosities. Skin:    General: Skin is warm and dry.     Findings: No erythema or rash.  Neurological:     Mental Status: Alert and oriented to person, place, and time.  Psychiatric:        Mood and Affect: Mood normal.        Behavior: Behavior normal.    Assessment/Plan: The patient is scheduled for bilateral breast reduction with possible liposuction with  Dr. Orin Birk.  Risks, benefits, and alternatives of procedure discussed, questions answered and consent obtained.    Smoking Status: Non-smoker.  Last Mammogram: N/A due to age.  Caprini Score: 3; Risk Factors include: BMI greater than 25 and length of planned surgery. Recommendation for mechanical prophylaxis. Encourage early ambulation.   Pictures obtained: 03/08/2023  Post-op Rx sent to pharmacy: Oxycodone , Zofran , Keflex.  Patient was provided with the General Surgical Risk consent document and Pain Medication Agreement prior to their appointment.  They had adequate time to read through the risk consent documents and Pain Medication Agreement. We also discussed them in person together during this preop appointment. All of their questions were answered to their satisfaction.  Recommended calling if they have any further questions.  Risk consent form and Pain Medication Agreement to be scanned into patient's chart.  The risk that can be encountered with breast reduction were discussed and include the following but not limited to these:  Breast asymmetry, fluid accumulation, firmness of the breast, inability to breast feed, loss of nipple or areola, skin loss, decrease or no nipple sensation, fat necrosis of the breast tissue, bleeding, infection, healing delay.  There are risks of anesthesia, changes to skin sensation and injury to nerves or blood vessels.  The muscle can be temporarily or permanently injured.  You may have an allergic reaction to tape, suture, glue, blood products which can result in skin discoloration, swelling, pain, skin lesions, poor healing.  Any of these can lead to the need for revisonal surgery or stage procedures.  A reduction has potential to interfere with diagnostic procedures.  Nipple or breast piercing can increase risks of infection.  This procedure is best done when the breast is fully developed.  Changes in the breast will continue to occur over time.  Pregnancy  can alter the outcomes of previous breast reduction surgery, weight gain and weigh loss can also effect the long term appearance.     Electronically signed by: Mariel Shope, PA-C 09/04/2023 11:59 AM

## 2023-09-04 NOTE — H&P (View-Only) (Signed)
 Patient ID: Paige AMISADAI BIGGERSTAFF, female    DOB: January 24, 1991, 33 y.o.   MRN: [id number]  Chief Complaint  Patient presents with   Pre-op Exam      ICD-10-CM   1. Symptomatic mammary hypertrophy  N62        History of Present Illness: Paige Fritz is a 33 y.o.  female  with a history of macromastia.  She presents for preoperative evaluation for upcoming procedure, bilateral breast reduction with possible liposuction, scheduled for 10/03/2023 with Dr. Orin Birk.  The patient has not had problems with anesthesia.  Previous fibroid surgery without complication.  She denies any personal or family history of blood clots or clotting disorder.  She denies any personal history of cancer, significant cardiac or pulmonary disease, varicosities, or nicotine use.  She confirms that she is an H cup and would like to be a C cup postoperatively.  She understands limitations with how much tissue can be safely excised without compromising breast shape or viability of nipples.  She understands to hold her phentermine /Topamax  compound 1 week prior to surgery.  She will also hold any vitamins and supplements 1 week prior to surgery.  Summary of Previous Visit: She was seen for initial consult by Dr. Orin Birk 03/08/2023.  At that time, complained of chronic upper back and neck discomfort in the context of large breasts.  STN measured 32 cm on the right, 35 cm in the left.  Preoperative bra size equals J cup.  She weighed 191 pounds.  Estimated tissue removed at time of surgery equals 825 g each side.  Discussed bilateral breast reduction with liposuction.  Job: Psychologist, sport and exercise at [employer], discussed 4 weeks FMLA/STD.  May need restrictions for an additional 2 weeks upon her return with regard to lifting.  PMH Significant for: Macromastia, obesity on phentermine , iron  deficiency anemia with most recent hemoglobin within normal limits 06/2023, HLD on simvastatin .   Past Medical  History: Allergies: No Known Allergies  Current Medications:  Current Outpatient Medications:    cephALEXin (KEFLEX) 500 MG capsule, Take 1 capsule (500 mg total) by mouth 4 (four) times daily for 3 days., Disp: 12 capsule, Rfl: 0   ibuprofen  (ADVIL ) 800 MG tablet, Take 800 mg by mouth every 8 (eight) hours as needed., Disp: , Rfl:    ondansetron  (ZOFRAN -ODT) 4 MG disintegrating tablet, Take 1 tablet (4 mg total) by mouth every 8 (eight) hours as needed for nausea or vomiting., Disp: 20 tablet, Rfl: 0   oxyCODONE  (ROXICODONE ) 5 MG immediate release tablet, Take 1 tablet (5 mg total) by mouth every 8 (eight) hours as needed for up to 5 days for severe pain (pain score 7-10)., Disp: 15 tablet, Rfl: 0   polyethylene glycol powder (GLYCOLAX /MIRALAX ) 17 GM/SCOOP powder, Take 17 g by mouth daily., Disp: , Rfl:    simvastatin  (ZOCOR ) 20 MG tablet, Take 1 tablet (20 mg total) by mouth at bedtime., Disp: 90 tablet, Rfl: 0   topiramate  (TOPAMAX ) 50 MG tablet, Take 1.5 tablets (75 mg total) by mouth daily., Disp: 90 tablet, Rfl: 0   triamcinolone  cream (KENALOG ) 0.1 %, Apply 1 Application topically 2 (two) times daily. (Patient taking differently: Apply 1 Application topically 2 (two) times daily as needed.), Disp: 30 g, Rfl: 0   [name] , Ergocalciferol , (DRISDOL ) 1.25 MG (50000 UNIT) CAPS capsule, Take 1 capsule (50,000 Units total) by mouth every 7 (seven) days., Disp: 12 capsule, Rfl: 0   Phentermine  HCl (LOMAIRA )  8 MG TABS, Take 1.5 tablets (12 mg total) by mouth daily., Disp: 45 tablet, Rfl: 0  Past Medical Problems: Past Medical History:  Diagnosis Date   Anxiety    Chronic back pain    per pt due to large breast   Chronic headaches    per pt due to back pain   Intramural and subserous leiomyoma of uterus    Iron  deficiency anemia due to chronic blood loss 2015   hematology/ oncologist--- Colie Dawes PA;   treated w/ iron  infusions   Menorrhagia with regular cycle    [name]  deficiency      Past Surgical History: Past Surgical History:  Procedure Laterality Date   HYSTEROSCOPY WITH Paige & C  11/02/2022   Procedure: DILATATION AND CURETTAGE /HYSTEROSCOPY;  Surgeon: Ivery Marking, MD;  Location: [facility];  Service: Gynecology;;   NO PAST SURGERIES     WISDOM TOOTH EXTRACTION  2019    Social History: Social History   Socioeconomic History   Marital status: Single    Spouse name: Not on file   Number of children: 0   Years of education: Not on file   Highest education level: Associate degree: occupational, Scientist, product/process development, or vocational program  Occupational History   Occupation: CNA, Associate Professor, Tax adviser, Consulting civil engineer   Occupation: Copywriter, advertising: [employer]  Tobacco Use   Smoking status: Never   Smokeless tobacco: Never  Vaping Use   Vaping status: Never Used  Substance and Sexual Activity   Alcohol use: Yes    Alcohol/week: 1.0 standard drink of alcohol    Types: 1 Glasses of wine per week    Comment: occasional   Drug use: Never   Sexual activity: Yes    Birth control/protection: None  Other Topics Concern   Not on file  Social History Narrative   Not on file   Social Drivers of Health   Financial Resource Strain: High Risk (06/17/2023)   Overall Financial Resource Strain (CARDIA)    Difficulty of Paying Living Expenses: Hard  Food Insecurity: Food Insecurity Present (06/17/2023)   Hunger Vital Sign    Worried About Running Out of Food in the Last Year: Never true    Ran Out of Food in the Last Year: Often true  Transportation Needs: No Transportation Needs (06/17/2023)   PRAPARE - Administrator, Civil Service (Medical): No    Lack of Transportation (Non-Medical): No  Physical Activity: Sufficiently Active (06/17/2023)   Exercise Vital Sign    Days of Exercise per Week: 7 days    Minutes of Exercise per Session: 30 min  Stress: No Stress Concern Present (06/17/2023)   [employer] of Occupational  Health - Occupational Stress Questionnaire    Feeling of Stress : Only a little  Social Connections: Socially Integrated (06/17/2023)   Social Connection and Isolation Panel [NHANES]    Frequency of Communication with Friends and Family: More than three times a week    Frequency of Social Gatherings with Friends and Family: More than three times a week    Attends Religious Services: More than 4 times per year    Active Member of [employer] or Organizations: Yes    Attends Engineer, structural: More than 4 times per year    Marital Status: Living with partner  Intimate Partner Violence: Not on file    Family History: Family History  Problem Relation Age of Onset   Obesity Mother  Hypertension Father     Review of Systems: ROS Denies any recent chest pain, difficulty breathing, leg swelling, fevers.  Physical Exam: Vital Signs BP 110/74 (BP Location: Left Arm, Patient Position: Sitting, Cuff Size: Normal)   Pulse (!) 102   Wt 184 lb (83.5 kg)   SpO2 99%   BMI 34.77 kg/m   Physical Exam  Constitutional:      General: Not in acute distress.    Appearance: Normal appearance. Not ill-appearing.  HENT:     Head: Normocephalic and atraumatic.  Eyes:     Pupils: Pupils are equal, round. Cardiovascular:     Rate and Rhythm: Normal rate.    Pulses: Normal pulses.  Pulmonary:     Effort: No respiratory distress or increased work of breathing.  Speaks in full sentences. Abdominal:     General: Abdomen is flat. No distension.   Musculoskeletal: Normal range of motion. No lower extremity swelling or edema. No varicosities. Skin:    General: Skin is warm and dry.     Findings: No erythema or rash.  Neurological:     Mental Status: Alert and oriented to person, place, and time.  Psychiatric:        Mood and Affect: Mood normal.        Behavior: Behavior normal.    Assessment/Plan: The patient is scheduled for bilateral breast reduction with possible liposuction with  Dr. Orin Birk.  Risks, benefits, and alternatives of procedure discussed, questions answered and consent obtained.    Smoking Status: Non-smoker.  Last Mammogram: N/A due to age.  Caprini Score: 3; Risk Factors include: BMI greater than 25 and length of planned surgery. Recommendation for mechanical prophylaxis. Encourage early ambulation.   Pictures obtained: 03/08/2023  Post-op Rx sent to pharmacy: Oxycodone , Zofran , Keflex.  Patient was provided with the General Surgical Risk consent document and Pain Medication Agreement prior to their appointment.  They had adequate time to read through the risk consent documents and Pain Medication Agreement. We also discussed them in person together during this preop appointment. All of their questions were answered to their satisfaction.  Recommended calling if they have any further questions.  Risk consent form and Pain Medication Agreement to be scanned into patient's chart.  The risk that can be encountered with breast reduction were discussed and include the following but not limited to these:  Breast asymmetry, fluid accumulation, firmness of the breast, inability to breast feed, loss of nipple or areola, skin loss, decrease or no nipple sensation, fat necrosis of the breast tissue, bleeding, infection, healing delay.  There are risks of anesthesia, changes to skin sensation and injury to nerves or blood vessels.  The muscle can be temporarily or permanently injured.  You may have an allergic reaction to tape, suture, glue, blood products which can result in skin discoloration, swelling, pain, skin lesions, poor healing.  Any of these can lead to the need for revisonal surgery or stage procedures.  A reduction has potential to interfere with diagnostic procedures.  Nipple or breast piercing can increase risks of infection.  This procedure is best done when the breast is fully developed.  Changes in the breast will continue to occur over time.  Pregnancy  can alter the outcomes of previous breast reduction surgery, weight gain and weigh loss can also effect the long term appearance.     Electronically signed by: Mariel Shope, PA-C 09/04/2023 11:59 AM

## 2023-09-05 HISTORY — PX: REDUCTION MAMMAPLASTY: SUR839

## 2023-09-09 ENCOUNTER — Inpatient Hospital Stay: Payer: Commercial Managed Care - PPO | Attending: Physician Assistant

## 2023-09-09 DIAGNOSIS — Z79899 Other long term (current) drug therapy: Secondary | ICD-10-CM | POA: Insufficient documentation

## 2023-09-09 DIAGNOSIS — D509 Iron deficiency anemia, unspecified: Secondary | ICD-10-CM | POA: Insufficient documentation

## 2023-09-10 ENCOUNTER — Encounter (INDEPENDENT_AMBULATORY_CARE_PROVIDER_SITE_OTHER): Payer: Self-pay | Admitting: Family Medicine

## 2023-09-10 ENCOUNTER — Ambulatory Visit (INDEPENDENT_AMBULATORY_CARE_PROVIDER_SITE_OTHER): Admitting: Family Medicine

## 2023-09-10 VITALS — BP 114/66 | HR 113 | Temp 98.1°F | Ht 61.0 in | Wt 180.0 lb

## 2023-09-10 DIAGNOSIS — F5089 Other specified eating disorder: Secondary | ICD-10-CM | POA: Diagnosis not present

## 2023-09-10 DIAGNOSIS — D500 Iron deficiency anemia secondary to blood loss (chronic): Secondary | ICD-10-CM

## 2023-09-10 DIAGNOSIS — Z6834 Body mass index (BMI) 34.0-34.9, adult: Secondary | ICD-10-CM | POA: Diagnosis not present

## 2023-09-10 DIAGNOSIS — E669 Obesity, unspecified: Secondary | ICD-10-CM

## 2023-09-10 DIAGNOSIS — E7800 Pure hypercholesterolemia, unspecified: Secondary | ICD-10-CM | POA: Diagnosis not present

## 2023-09-10 DIAGNOSIS — E66812 Obesity, class 2: Secondary | ICD-10-CM

## 2023-09-10 MED ORDER — TOPIRAMATE 50 MG PO TABS: 75.0000 mg | ORAL_TABLET | Freq: Every day | ORAL | 0 refills | Status: DC

## 2023-09-10 MED ORDER — LOMAIRA 8 MG PO TABS: 12.0000 mg | ORAL_TABLET | Freq: Every day | ORAL | 0 refills | Status: DC

## 2023-09-10 MED ORDER — LOMAIRA 8 MG PO TABS
12.0000 mg | ORAL_TABLET | Freq: Every day | ORAL | 0 refills | Status: DC
Start: 2023-09-10 — End: 2023-09-10

## 2023-09-10 NOTE — Assessment & Plan Note (Signed)
 Last LDL elevated but not on medications.  Will repeat FLP in 8 weeks

## 2023-09-10 NOTE — Assessment & Plan Note (Signed)
 On combination lomaira  and topiramate .  Starting weight was 213lb at end of May 2024.   And goal for 5% loss was 11lbs.  Patient is now at 180lbs which is a 33 lb loss.  Breast reduction surgery scheduled for end of this month.  Will write for 2 months work of prescription today.  PDMP check and no concerns

## 2023-09-10 NOTE — Progress Notes (Signed)
 SUBJECTIVE:  Chief Complaint: Obesity  Interim History: Patient is planning for surgery at the end of the month- she is feeling pretty good overall.  She recognizes she was introducing oatmeal to help with frequency of bowel movements but thinks that this is what attributed to lack of weight loss.  She did pick up a few old habits of snacking during studying and some mindless eating.  She is now done with the semester.  She did go back to intermittent fasting and juicing.  For the next three weeks her plan is focusing on lighter eating with chicken and salad.  Paige Fritz is here to discuss her progress with her obesity treatment plan. She is on the Category 2 Plan and states she is following her eating plan approximately 50 % of the time. She states she is not exercising as much.   OBJECTIVE: Visit Diagnoses: Problem List Items Addressed This Visit       Other   Iron  deficiency anemia due to chronic blood loss - Primary   Patient to get labs done at next appointment to reassess CBC and Anemia Panel.      Pure hypercholesterolemia   Last LDL elevated but not on medications.  Will repeat FLP in 8 weeks      Disorder of eating   On combination lomaira  and topiramate .  Starting weight was 213lb at end of May 2024.   And goal for 5% loss was 11lbs.  Patient is now at 180lbs which is a 33 lb loss.  Breast reduction surgery scheduled for end of this month.  Will write for 2 months work of prescription today.  PDMP check and no concerns      Relevant Medications   topiramate  (TOPAMAX ) 50 MG tablet   Phentermine  HCl (LOMAIRA ) 8 MG TABS    Vitals Temp: 98.1 F (36.7 C) BP: 114/66 Pulse Rate: (!) 113 SpO2: 99 %   Anthropometric Measurements Height: 5\' 1"  (1.549 m) Weight: 180 lb (81.6 kg) BMI (Calculated): 34.03 Weight at Last Visit: 180 lb Weight Lost Since Last Visit: 0 Weight Gained Since Last Visit: 0 Starting Weight: 192 lb Total Weight Loss (lbs): 12 lb (5.443 kg) Peak  Weight: 217 lb   Body Composition  Body Fat %: 41.6 % Fat Mass (lbs): 75 lbs Muscle Mass (lbs): 100 lbs Total Body Water  (lbs): 76 lbs Visceral Fat Rating : 8   Other Clinical Data Today's Visit #: 17 Starting Date: 06/21/21 Comments: Cat 2     ASSESSMENT AND PLAN:  Diet: Paige Fritz is currently in the action stage of change. As such, her goal is to continue with weight loss efforts and has agreed to the Category 2 Plan.   Exercise:  For substantial health benefits, adults should do at least 150 minutes (2 hours and 30 minutes) a week of moderate-intensity, or 75 minutes (1 hour and 15 minutes) a week of vigorous-intensity aerobic physical activity, or an equivalent combination of moderate- and vigorous-intensity aerobic activity. Aerobic activity should be performed in episodes of at least 10 minutes, and preferably, it should be spread throughout the week.  Behavior Modification:  We discussed the following Behavioral Modification Strategies today: increasing lean protein intake, decreasing simple carbohydrates, increasing vegetables, meal planning and cooking strategies, keeping healthy foods in the home, avoiding temptations, and planning for success. We discussed various medication options to help Paige Fritz with her weight loss efforts and we both agreed to continue medication at current dose.  Return in about  8 weeks (around 11/05/2023).   She was informed of the importance of frequent follow up visits to maximize her success with intensive lifestyle modifications for her multiple health conditions.  Attestation Statements:   Reviewed by clinician on day of visit: allergies, medications, problem list, medical history, surgical history, family history, social history, and previous encounter notes.    Donaciano Frizzle, MD

## 2023-09-10 NOTE — Assessment & Plan Note (Signed)
 Patient to get labs done at next appointment to reassess CBC and Anemia Panel.

## 2023-09-19 DIAGNOSIS — Z719 Counseling, unspecified: Secondary | ICD-10-CM

## 2023-09-23 ENCOUNTER — Inpatient Hospital Stay

## 2023-09-23 DIAGNOSIS — D509 Iron deficiency anemia, unspecified: Secondary | ICD-10-CM | POA: Diagnosis not present

## 2023-09-23 DIAGNOSIS — Z79899 Other long term (current) drug therapy: Secondary | ICD-10-CM | POA: Diagnosis not present

## 2023-09-23 DIAGNOSIS — D500 Iron deficiency anemia secondary to blood loss (chronic): Secondary | ICD-10-CM

## 2023-09-23 LAB — CBC WITH DIFFERENTIAL (CANCER CENTER ONLY)
Abs Immature Granulocytes: 0.01 10*3/uL (ref 0.00–0.07)
Basophils Absolute: 0.1 10*3/uL (ref 0.0–0.1)
Basophils Relative: 1 %
Eosinophils Absolute: 0.1 10*3/uL (ref 0.0–0.5)
Eosinophils Relative: 2 %
HCT: 34.4 % — ABNORMAL LOW (ref 36.0–46.0)
Hemoglobin: 12.1 g/dL (ref 12.0–15.0)
Immature Granulocytes: 0 %
Lymphocytes Relative: 45 %
Lymphs Abs: 2.6 10*3/uL (ref 0.7–4.0)
MCH: 32 pg (ref 26.0–34.0)
MCHC: 35.2 g/dL (ref 30.0–36.0)
MCV: 91 fL (ref 80.0–100.0)
Monocytes Absolute: 0.4 10*3/uL (ref 0.1–1.0)
Monocytes Relative: 7 %
Neutro Abs: 2.6 10*3/uL (ref 1.7–7.7)
Neutrophils Relative %: 45 %
Platelet Count: 337 10*3/uL (ref 150–400)
RBC: 3.78 MIL/uL — ABNORMAL LOW (ref 3.87–5.11)
RDW: 13.1 % (ref 11.5–15.5)
WBC Count: 5.8 10*3/uL (ref 4.0–10.5)
nRBC: 0 % (ref 0.0–0.2)

## 2023-09-23 LAB — IRON AND IRON BINDING CAPACITY (CC-WL,HP ONLY)
Iron: 65 ug/dL (ref 28–170)
Saturation Ratios: 24 % (ref 10.4–31.8)
TIBC: 274 ug/dL (ref 250–450)
UIBC: 209 ug/dL (ref 148–442)

## 2023-09-23 LAB — FERRITIN: Ferritin: 108 ng/mL (ref 11–307)

## 2023-09-24 ENCOUNTER — Encounter (HOSPITAL_BASED_OUTPATIENT_CLINIC_OR_DEPARTMENT_OTHER): Payer: Self-pay | Admitting: Plastic Surgery

## 2023-09-24 ENCOUNTER — Other Ambulatory Visit: Payer: Self-pay

## 2023-09-24 NOTE — Progress Notes (Signed)
   09/24/23 1621  PAT Phone Screen  Is the patient taking a GLP-1 receptor agonist? No  Do You Have Diabetes? No  Do You Have Hypertension? No  Have You Ever Been to the ER for Asthma? No  Have You Taken Oral Steroids in the Past 3 Months? No  Do you Take Phenteramine or any Other Diet Drugs? Yes (last dose 09/22/23)  Recent  Lab Work, EKG, CXR? Yes  Where was this test performed? [facility] 09/23/23  Do you have a history of heart problems? No  Any Recent Hospitalizations? No  Height 5\' 1"  (1.549 m)  Weight 81 kg  Pat Appointment Scheduled No  Reason for No Appointment Not Needed

## 2023-09-26 ENCOUNTER — Ambulatory Visit: Payer: Self-pay | Admitting: Physician Assistant

## 2023-10-02 NOTE — Anesthesia Preprocedure Evaluation (Signed)
 Anesthesia Evaluation  Patient identified by MRN, date of birth, ID band Patient awake    Reviewed: Allergy & Precautions, NPO status , Patient's Chart, lab work & pertinent test results  History of Anesthesia Complications Negative for: history of anesthetic complications  Airway Mallampati: I  TM Distance: >3 FB Neck ROM: Full    Dental  (+) Dental Advisory Given   Pulmonary neg pulmonary ROS   Pulmonary exam normal breath sounds clear to auscultation       Cardiovascular (-) hypertension(-) angina (-) Past MI, (-) Cardiac Stents and (-) CABG (-) dysrhythmias  Rhythm:Regular Rate:Normal  HLD   Neuro/Psych  Headaches, neg Seizures PSYCHIATRIC DISORDERS Anxiety      Neuromuscular disease (chronic back pain)    GI/Hepatic negative GI ROS, Neg liver ROS,,,  Endo/Other  negative endocrine ROS    Renal/GU negative Renal ROS     Musculoskeletal   Abdominal  (+) + obese  Peds  Hematology  (+) Blood dyscrasia, anemia Lab Results      Component                Value               Date                      WBC                      5.8                 09/23/2023                HGB                      12.1                09/23/2023                HCT                      34.4 (L)            09/23/2023                MCV                      91.0                09/23/2023                PLT                      337                 09/23/2023              Anesthesia Other Findings Last phentermine : 09/22/2023  Reproductive/Obstetrics                             Anesthesia Physical Anesthesia Plan  ASA: 2  Anesthesia Plan: General   Post-op Pain Management: Tylenol  PO (pre-op)*   Induction: Intravenous  PONV Risk Score and Plan: 3 and Ondansetron , Dexamethasone , Midazolam , Scopolamine patch - Pre-op and Treatment may vary due to age or medical condition  Airway Management Planned: Oral  ETT  Additional Equipment:   Intra-op Plan:   Post-operative Plan: Extubation  in OR  Informed Consent: I have reviewed the patients History and Physical, chart, labs and discussed the procedure including the risks, benefits and alternatives for the proposed anesthesia with the patient or authorized representative who has indicated his/her understanding and acceptance.     Dental advisory given  Plan Discussed with: CRNA and Anesthesiologist  Anesthesia Plan Comments: (Risks of general anesthesia discussed including, but not limited to, sore throat, hoarse voice, chipped/damaged teeth, injury to vocal cords, nausea and vomiting, allergic reactions, lung infection, heart attack, stroke, and death. All questions answered. )        Anesthesia Quick Evaluation

## 2023-10-03 ENCOUNTER — Encounter (HOSPITAL_BASED_OUTPATIENT_CLINIC_OR_DEPARTMENT_OTHER): Payer: Self-pay | Admitting: Plastic Surgery

## 2023-10-03 ENCOUNTER — Ambulatory Visit (HOSPITAL_BASED_OUTPATIENT_CLINIC_OR_DEPARTMENT_OTHER)
Admission: RE | Admit: 2023-10-03 | Discharge: 2023-10-03 | Disposition: A | Discharge status: Home / Self Care | Payer: Commercial Managed Care - PPO | Attending: Plastic Surgery | Admitting: Plastic Surgery

## 2023-10-03 ENCOUNTER — Other Ambulatory Visit: Payer: Self-pay

## 2023-10-03 ENCOUNTER — Ambulatory Visit (HOSPITAL_BASED_OUTPATIENT_CLINIC_OR_DEPARTMENT_OTHER): Payer: Self-pay | Admitting: Anesthesiology

## 2023-10-03 ENCOUNTER — Encounter (HOSPITAL_BASED_OUTPATIENT_CLINIC_OR_DEPARTMENT_OTHER)
Admission: RE | Disposition: A | Discharge status: Home / Self Care | Payer: Self-pay | Source: Home / Self Care | Attending: Plastic Surgery

## 2023-10-03 DIAGNOSIS — R519 Headache, unspecified: Secondary | ICD-10-CM | POA: Insufficient documentation

## 2023-10-03 DIAGNOSIS — Z5986 Financial insecurity: Secondary | ICD-10-CM | POA: Insufficient documentation

## 2023-10-03 DIAGNOSIS — N62 Hypertrophy of breast: Secondary | ICD-10-CM

## 2023-10-03 DIAGNOSIS — M546 Pain in thoracic spine: Secondary | ICD-10-CM | POA: Insufficient documentation

## 2023-10-03 DIAGNOSIS — E785 Hyperlipidemia, unspecified: Secondary | ICD-10-CM | POA: Insufficient documentation

## 2023-10-03 DIAGNOSIS — M542 Cervicalgia: Secondary | ICD-10-CM | POA: Diagnosis not present

## 2023-10-03 DIAGNOSIS — G8929 Other chronic pain: Secondary | ICD-10-CM | POA: Diagnosis not present

## 2023-10-03 HISTORY — PX: BREAST REDUCTION SURGERY: SHX8

## 2023-10-03 LAB — POCT PREGNANCY, URINE: Preg Test, Ur: NEGATIVE

## 2023-10-03 SURGERY — BREAST REDUCTION WITH LIPOSUCTION
Anesthesia: Choice | Site: Breast | Laterality: Bilateral

## 2023-10-03 MED ORDER — FENTANYL CITRATE (PF) 100 MCG/2ML IJ SOLN
INTRAMUSCULAR | Status: AC
Start: 2023-10-03 — End: ?
  Filled 2023-10-03: qty 2

## 2023-10-03 MED ORDER — LIDOCAINE HCL 1 % IJ SOLN
INTRAVENOUS | Status: DC | PRN
  Administered 2023-10-03: 500 mL

## 2023-10-03 MED ORDER — ONDANSETRON HCL 4 MG/2ML IJ SOLN
INTRAMUSCULAR | Status: DC | PRN
Start: 2023-10-03 — End: 2023-10-03
  Administered 2023-10-03: 4 mg via INTRAVENOUS

## 2023-10-03 MED ORDER — HYDROMORPHONE HCL 1 MG/ML IJ SOLN
INTRAMUSCULAR | Status: AC
  Filled 2023-10-03: qty 0.5

## 2023-10-03 MED ORDER — FENTANYL CITRATE (PF) 100 MCG/2ML IJ SOLN
25.0000 ug | INTRAMUSCULAR | Status: AC
  Administered 2023-10-03: 50 ug via INTRAVENOUS

## 2023-10-03 MED ORDER — VASHE WOUND IRRIGATION OPTIME
TOPICAL | Status: DC | PRN
  Administered 2023-10-03: 34 [oz_av]

## 2023-10-03 MED ORDER — LIDOCAINE 2% (20 MG/ML) 5 ML SYRINGE
INTRAMUSCULAR | Status: AC
  Filled 2023-10-03: qty 5

## 2023-10-03 MED ORDER — SODIUM CHLORIDE 0.9 % IV SOLN: 250.0000 mL | INTRAVENOUS | Status: DC | PRN

## 2023-10-03 MED ORDER — ONDANSETRON HCL 4 MG/2ML IJ SOLN
INTRAMUSCULAR | Status: AC
  Filled 2023-10-03: qty 2

## 2023-10-03 MED ORDER — HYDROMORPHONE HCL 1 MG/ML IJ SOLN
0.2500 mg | INTRAMUSCULAR | Status: DC | PRN
  Administered 2023-10-03: 0.5 mg via INTRAVENOUS

## 2023-10-03 MED ORDER — LIDOCAINE 2% (20 MG/ML) 5 ML SYRINGE
INTRAMUSCULAR | Status: DC | PRN
  Administered 2023-10-03: 100 mg via INTRAVENOUS

## 2023-10-03 MED ORDER — DEXAMETHASONE SODIUM PHOSPHATE 10 MG/ML IJ SOLN
INTRAMUSCULAR | Status: AC
  Filled 2023-10-03: qty 1

## 2023-10-03 MED ORDER — ACETAMINOPHEN 325 MG PO TABS: 650.0000 mg | ORAL_TABLET | ORAL | Status: DC | PRN

## 2023-10-03 MED ORDER — PROPOFOL 10 MG/ML IV BOLUS
INTRAVENOUS | Status: DC | PRN
  Administered 2023-10-03: 200 mg via INTRAVENOUS

## 2023-10-03 MED ORDER — PROPOFOL 10 MG/ML IV BOLUS
INTRAVENOUS | Status: AC
  Filled 2023-10-03: qty 20

## 2023-10-03 MED ORDER — CEFAZOLIN SODIUM-DEXTROSE 2-4 GM/100ML-% IV SOLN
2.0000 g | INTRAVENOUS | Status: AC
Start: 2023-10-03 — End: 2023-10-03
  Administered 2023-10-03: 2 g via INTRAVENOUS

## 2023-10-03 MED ORDER — ACETAMINOPHEN 500 MG PO TABS
1000.0000 mg | ORAL_TABLET | Freq: Once | ORAL | Status: AC
  Administered 2023-10-03: 1000 mg via ORAL

## 2023-10-03 MED ORDER — OXYCODONE HCL 5 MG PO TABS
5.0000 mg | ORAL_TABLET | Freq: Once | ORAL | Status: AC | PRN
  Administered 2023-10-03: 5 mg via ORAL

## 2023-10-03 MED ORDER — NITROGLYCERIN 2 % TD OINT
TOPICAL_OINTMENT | TRANSDERMAL | Status: AC
  Filled 2023-10-03: qty 30

## 2023-10-03 MED ORDER — SODIUM CHLORIDE 0.9% FLUSH: 3.0000 mL | INTRAVENOUS | Status: DC | PRN

## 2023-10-03 MED ORDER — SODIUM CHLORIDE 0.9% FLUSH: 3.0000 mL | Freq: Two times a day (BID) | INTRAVENOUS | Status: DC

## 2023-10-03 MED ORDER — OXYCODONE HCL 5 MG PO TABS: 5.0000 mg | ORAL_TABLET | ORAL | Status: DC | PRN

## 2023-10-03 MED ORDER — ACETAMINOPHEN 325 MG RE SUPP: 650.0000 mg | RECTAL | Status: DC | PRN

## 2023-10-03 MED ORDER — MIDAZOLAM HCL 5 MG/5ML IJ SOLN
INTRAMUSCULAR | Status: DC | PRN
  Administered 2023-10-03: 2 mg via INTRAVENOUS

## 2023-10-03 MED ORDER — OXYCODONE HCL 5 MG PO TABS
ORAL_TABLET | ORAL | Status: AC
  Filled 2023-10-03: qty 1

## 2023-10-03 MED ORDER — FENTANYL CITRATE (PF) 100 MCG/2ML IJ SOLN
25.0000 ug | INTRAMUSCULAR | Status: DC | PRN
  Administered 2023-10-03: 50 ug via INTRAVENOUS

## 2023-10-03 MED ORDER — OXYCODONE HCL 5 MG/5ML PO SOLN: 5.0000 mg | Freq: Once | ORAL | Status: AC | PRN

## 2023-10-03 MED ORDER — FENTANYL CITRATE (PF) 250 MCG/5ML IJ SOLN
INTRAMUSCULAR | Status: DC | PRN
Start: 2023-10-03 — End: 2023-10-03
  Administered 2023-10-03: 100 ug via INTRAVENOUS

## 2023-10-03 MED ORDER — ROCURONIUM BROMIDE 10 MG/ML (PF) SYRINGE
PREFILLED_SYRINGE | INTRAVENOUS | Status: DC | PRN
  Administered 2023-10-03: 60 mg via INTRAVENOUS
  Administered 2023-10-03: 20 mg via INTRAVENOUS

## 2023-10-03 MED ORDER — LIDOCAINE-EPINEPHRINE 1 %-1:100000 IJ SOLN
INTRAMUSCULAR | Status: DC | PRN
  Administered 2023-10-03: 45 mL

## 2023-10-03 MED ORDER — CHLORHEXIDINE GLUCONATE CLOTH 2 % EX PADS: 6.0000 | MEDICATED_PAD | Freq: Once | CUTANEOUS | Status: DC

## 2023-10-03 MED ORDER — CEFAZOLIN SODIUM-DEXTROSE 2-4 GM/100ML-% IV SOLN
INTRAVENOUS | Status: AC
  Filled 2023-10-03: qty 100

## 2023-10-03 MED ORDER — NITROGLYCERIN 2 % TD OINT
TOPICAL_OINTMENT | TRANSDERMAL | Status: DC | PRN
  Administered 2023-10-03: 1 [in_us] via TOPICAL

## 2023-10-03 MED ORDER — SODIUM CHLORIDE 0.9 % IV SOLN
INTRAVENOUS | Status: DC | PRN
  Administered 2023-10-03: 40 mL

## 2023-10-03 MED ORDER — SUGAMMADEX SODIUM 200 MG/2ML IV SOLN
INTRAVENOUS | Status: DC | PRN
  Administered 2023-10-03: 200 mg via INTRAVENOUS

## 2023-10-03 MED ORDER — LACTATED RINGERS IV SOLN: INTRAVENOUS | Status: DC | PRN

## 2023-10-03 MED ORDER — SCOPOLAMINE 1 MG/3DAYS TD PT72
MEDICATED_PATCH | TRANSDERMAL | Status: AC
  Filled 2023-10-03: qty 1

## 2023-10-03 MED ORDER — AMISULPRIDE (ANTIEMETIC) 5 MG/2ML IV SOLN: 10.0000 mg | Freq: Once | INTRAVENOUS | Status: DC | PRN

## 2023-10-03 MED ORDER — FENTANYL CITRATE (PF) 100 MCG/2ML IJ SOLN
INTRAMUSCULAR | Status: AC
  Filled 2023-10-03: qty 2

## 2023-10-03 MED ORDER — ACETAMINOPHEN 500 MG PO TABS
ORAL_TABLET | ORAL | Status: AC
  Filled 2023-10-03: qty 2

## 2023-10-03 MED ORDER — DEXAMETHASONE SODIUM PHOSPHATE 10 MG/ML IJ SOLN
INTRAMUSCULAR | Status: DC | PRN
  Administered 2023-10-03: 10 mg via INTRAVENOUS

## 2023-10-03 MED ORDER — BUPIVACAINE LIPOSOME 1.3 % IJ SUSP
INTRAMUSCULAR | Status: AC
  Filled 2023-10-03: qty 20

## 2023-10-03 MED ORDER — MIDAZOLAM HCL 2 MG/2ML IJ SOLN
INTRAMUSCULAR | Status: AC
  Filled 2023-10-03: qty 2

## 2023-10-03 MED ORDER — HYDROMORPHONE HCL 1 MG/ML IJ SOLN
INTRAMUSCULAR | Status: DC | PRN
  Administered 2023-10-03: .5 mg via INTRAVENOUS

## 2023-10-03 MED ORDER — PHENYLEPHRINE HCL (PRESSORS) 10 MG/ML IV SOLN
INTRAVENOUS | Status: DC | PRN
  Administered 2023-10-03 (×2): 80 ug via INTRAVENOUS
  Administered 2023-10-03: 120 ug via INTRAVENOUS
  Administered 2023-10-03 (×4): 80 ug via INTRAVENOUS

## 2023-10-03 MED ORDER — SCOPOLAMINE 1 MG/3DAYS TD PT72
1.0000 | MEDICATED_PATCH | TRANSDERMAL | Status: DC
  Administered 2023-10-03: 1.5 mg via TRANSDERMAL

## 2023-10-03 SURGICAL SUPPLY — 2 items
NDL FILTER BLUNT 18X1 1/2 (NEEDLE) IMPLANT
NDL HYPO 25X1 1.5 SAFETY (NEEDLE) ×2 IMPLANT

## 2023-10-03 NOTE — Interval H&P Note (Signed)
 History and Physical Interval Note:  10/03/2023 7:52 AM  Paige Fritz  has presented today for surgery, with the diagnosis of Macromastia N62.  The various methods of treatment have been discussed with the patient and family. After consideration of risks, benefits and other options for treatment, the patient has consented to  Procedure(s): BREAST REDUCTION WITH LIPOSUCTION (Bilateral) as a surgical intervention.  The patient's history has been reviewed, patient examined, no change in status, stable for surgery.  I have reviewed the patient's chart and labs.  Questions were answered to the patient's satisfaction.     Lindaann Requena Maureen Delatte

## 2023-10-03 NOTE — Op Note (Signed)
 Breast Reduction Op note:    DATE OF PROCEDURE: 10/03/2023  LOCATION: [facility]  SURGEON: Gilles Lacks, DO  ASSISTANT: Perrin Brakeman, PA  PREOPERATIVE DIAGNOSIS 1. Macromastia 2. Neck Pain 3. Back Pain  POSTOPERATIVE DIAGNOSIS 1. Macromastia 2. Neck Pain 3. Back Pain  PROCEDURES 1. Bilateral breast reduction.  Right reduction 1150 g, Left reduction 1245 g  COMPLICATIONS: None.  DRAINS: none  INDICATIONS FOR PROCEDURE Paige Fritz is a 33 y.o. year-old female born on 04-13-1991,with a history of symptomatic macromastia with concomitant back pain, neck pain, shoulder grooving from her bra.   MRN: [id number]  CONSENT Informed consent was obtained directly from the patient. The risks, benefits and alternatives were fully discussed. Specific risks including but not limited to bleeding, infection, hematoma, seroma, scarring, pain, nipple necrosis, asymmetry, poor cosmetic results, and need for further surgery were discussed. The patient's questions were answered.  DESCRIPTION OF PROCEDURE  Patient was brought into the operating room and rested on the operating room table in the supine position.  SCDs were placed and appropriate padding was performed.  Antibiotics were given. The patient underwent general anesthesia and the chest was prepped and draped in a sterile fashion.  A timeout was performed and all information was confirmed to be correct by those in the room. Tumescent was placed in the lateral breast area on each side.  Liposuction was done laterally for improved contour of the breasts and symmetry.   Right side: Preoperative markings were confirmed.  Incision lines were injected with local containing epinephrine.  After waiting for vasoconstriction, the marked lines were incised with a #15 blade.  A Wise-pattern superomedial breast reduction was performed by de-epithelializing the pedicle, using bovie to create the superomedial pedicle,  and removing breast tissue from the superior, lateral, and inferior portions of the breast.  Experel and Myriad were placed in the pocket. Care was taken to not undermine the breast pedicle. Hemostasis was achieved.  The nipple was gently rotated into position and the soft tissue closed with 4-0 Monocryl.   The pocket was irrigated and hemostasis confirmed.  The deep tissues were approximated with 3-0 PDS sutures.  The skin was closed with deep dermal 3-0 Monocryl and subcuticular 4-0 Monocryl sutures.  The nipple and skin flaps had good capillary refill at the end of the procedure.    Left side: Preoperative markings were confirmed.  Incision lines were injected with local containing epinephrine.  After waiting for vasoconstriction, the marked lines were incised with a #15 blade.  A Wise-pattern superomedial breast reduction was performed by de-epithelializing the pedicle, using bovie to create the superomedial pedicle, and removing breast tissue from the superior, lateral, and inferior portions of the breast.  Experel and myriad were placed in the pocket. Care was taken to not undermine the breast pedicle. Hemostasis was achieved.  The nipple was gently rotated into position and the soft tissue was closed with 4-0 Monocryl.  The patient was sat upright and size and shape symmetry was confirmed.  The pocket was irrigated and hemostasis confirmed.  The deep tissues were approximated with 3-0 PDS sutures. The skin was closed with deep dermal 3-0 Monocryl and subcuticular 4-0 Monocryl sutures.  Dermabond was applied.  A breast binder and ABDs were placed.  The nipple and skin flaps had good capillary refill at the end of the procedure.  The patient tolerated the procedure well. The patient was allowed to wake from anesthesia and taken to the recovery room in satisfactory  condition. Nitropaste was placed on the areola.   The advanced practice practitioner (APP) assisted throughout the case.  The APP was essential  in retraction and counter traction when needed to make the case progress smoothly.  This retraction and assistance made it possible to see the tissue plans for the procedure.  The assistance was needed for blood control, tissue re-approximation and assisted with closure of the incision site.

## 2023-10-03 NOTE — Anesthesia Procedure Notes (Signed)
 Procedure Name: Intubation Date/Time: 10/03/2023 8:41 AM  Performed by: Adolphus Hoops, CRNAPre-anesthesia Checklist: Patient identified, Emergency Drugs available, Suction available and Patient being monitored Patient Re-evaluated:Patient Re-evaluated prior to induction Oxygen Delivery Method: Circle System Utilized Preoxygenation: Pre-oxygenation with 100% oxygen Induction Type: IV induction Ventilation: Mask ventilation without difficulty Laryngoscope Size: Mac and 3 Grade View: Grade I Tube type: Oral Tube size: 7.0 mm Number of attempts: 1 Airway Equipment and Method: Stylet and Oral airway Placement Confirmation: ETT inserted through vocal cords under direct vision, positive ETCO2 and breath sounds checked- equal and bilateral Secured at: 21 cm Tube secured with: Tape Dental Injury: Teeth and Oropharynx as per pre-operative assessment

## 2023-10-03 NOTE — Anesthesia Postprocedure Evaluation (Signed)
 Anesthesia Post Note  Patient: Paige Fritz  Procedure(s) Performed: BREAST REDUCTION WITH LIPOSUCTION (Bilateral: Breast)     Patient location during evaluation: PACU Anesthesia Type: General Level of consciousness: awake Pain management: pain level controlled Vital Signs Assessment: post-procedure vital signs reviewed and stable Respiratory status: spontaneous breathing, nonlabored ventilation and respiratory function stable Cardiovascular status: blood pressure returned to baseline and stable Postop Assessment: no apparent nausea or vomiting Anesthetic complications: no   No notable events documented.  Last Vitals:  Vitals:   10/03/23 0649 10/03/23 1144  BP: 131/78 (!) 96/55  Pulse: 80   Resp: 16   Temp: 36.8 C   SpO2: 100%     Last Pain:  Vitals:   10/03/23 1134  TempSrc:   PainSc: 7                  Conard Decent

## 2023-10-03 NOTE — Transfer of Care (Signed)
 Immediate Anesthesia Transfer of Care Note  Patient: Paige Fritz  Procedure(s) Performed: BREAST REDUCTION WITH LIPOSUCTION (Bilateral: Breast)  Patient Location: PACU  Anesthesia Type:General  Level of Consciousness: drowsy  Airway & Oxygen Therapy: Patient Spontanous Breathing and Patient connected to face mask oxygen  Post-op Assessment: Report given to RN and Post -op Vital signs reviewed and stable  Post vital signs: Reviewed and stable  Last Vitals:  Vitals Value Taken Time  BP 123/58 10/03/23 1047  Temp    Pulse 97 10/03/23 1048  Resp 25 10/03/23 1048  SpO2 98 % 10/03/23 1048  Vitals shown include unfiled device data.  Last Pain:  Vitals:   10/03/23 0649  TempSrc: Temporal  PainSc: 0-No pain      Patients Stated Pain Goal: 6 (10/03/23 0454)  Complications: No notable events documented.

## 2023-10-03 NOTE — Discharge Instructions (Addendum)
 INSTRUCTIONS FOR AFTER BREAST SURGERY   You will likely have some questions about what to expect following your operation.  The following information will help you and your family understand what to expect when you are discharged from the hospital.  It is important to follow these guidelines to help ensure a smooth recovery and reduce complication.  Postoperative instructions include information on: diet, wound care, medications and physical activity.  AFTER SURGERY Expect to go home after the procedure.  In some cases, you may need to spend one night in the hospital for observation.  DIET Breast surgery does not require a specific diet.  However, the healthier you eat the better your body will heal. It is important to increasing your protein intake.  This means limiting the foods with sugar and carbohydrates.  Focus on vegetables and some meat.  If you have liposuction during your procedure be sure to drink water .  If your urine is bright yellow, then it is concentrated, and you need to drink more water .  As a general rule after surgery, you should have 8 ounces of water  every hour while awake.  If you find you are persistently nauseated or unable to take in liquids let us  know.  NO TOBACCO USE or EXPOSURE.  This will slow your healing process and lead to a wound.  WOUND CARE Leave the binder on for 3 days . Use fragrance free soap like Dial, Dove or [place].   After 3 days you can remove the binder to shower. Once dry apply binder or sports bra. If you have liposuction you will have a soft and spongy dressing (Lipofoam) that helps prevent creases in your skin.  Remove before you shower and then replace it.  It is also available on [employer]. If you have steri-strips / tape directly attached to your skin leave them in place. It is OK to get these wet.   No baths, pools or hot tubs for four weeks. We close your incision to leave the smallest and best-looking scar. No ointment or creams on your incisions  for four weeks.  No Neosporin (Too many skin reactions).  A few weeks after surgery you can use Mederma and start massaging the scar. We ask you to wear your binder or sports bra for the first 6 weeks around the clock, including while sleeping. This provides added comfort and helps reduce the fluid accumulation at the surgery site. NO Ice or heating pads to the operative site.  You have a very high risk of a BURN before you feel the temperature change. Next tylenol  dose after 2pm  ACTIVITY No heavy lifting until cleared by the doctor.  This usually means no more than a half-gallon of milk.  It is OK to walk and climb stairs. Moving your legs is very important to decrease your risk of a blood clot.  It will also help keep you from getting deconditioned.  Every 1 to 2 hours get up and walk for 5 minutes. This will help with a quicker recovery back to normal.  Let pain be your guide so you don't do too much.  This time is for you to recover.  You will be more comfortable if you sleep and rest with your head elevated either with a few pillows under you or in a recliner.  No stomach sleeping for a three months.  WORK Everyone returns to work at different times. As a rough guide, most people take at least 1 - 2 weeks off prior  to returning to work. If you need documentation for your job, give the forms to the front staff at the clinic.  DRIVING Arrange for someone to bring you home from the hospital after your surgery.  You may be able to drive a few days after surgery but not while taking any narcotics or valium.  BOWEL MOVEMENTS Constipation can occur after anesthesia and while taking pain medication.  It is important to stay ahead for your comfort.  We recommend taking Milk of Magnesia (2 tablespoons; twice a day) while taking the pain pills.  MEDICATIONS You may be prescribed should start after surgery At your preoperative visit for you history and physical you may have been given the following  medications: An antibiotic: Start this medication when you get home and take according to the instructions on the bottle. Zofran  4 mg:  This is to treat nausea and vomiting.  You can take this every 6 hours as needed and only if needed. Valium 2 mg for breast cancer patients: This is for muscle tightness if you have an implant or expander. This will help relax your muscle which also helps with pain control.  This can be taken every 12 hours as needed. Don't drive after taking this medication. Norco (hydrocodone /acetaminophen ) 5/325 mg:  This is only to be used after you have taken the Motrin  or the Tylenol . Every 8 hours as needed.   Over the counter Medication to take: Ibuprofen  (Motrin ) 600 mg:  Take this every 6 hours.  If you have additional pain then take 500 mg of the Tylenol  every 8 hours.  Only take the Norco after you have tried these two. MiraLAX  or Milk of Magnesia: Take this according to the bottle if you take the Norco.  WHEN TO CALL Call your surgeon's office if any of the following occur: Fever 101 degrees F or greater Excessive bleeding or fluid from the incision site. Pain that increases over time without aid from the medications Redness, warmth, or pus draining from incision sites Persistent nausea or inability to take in liquids Post Anesthesia Home Care Instructions  Activity: Get plenty of rest for the remainder of the day. A responsible individual must stay with you for 24 hours following the procedure.  For the next 24 hours, DO NOT: -Drive a car -Advertising copywriter -Drink alcoholic beverages -Take any medication unless instructed by your physician -Make any legal decisions or sign important papers.  Meals: Start with liquid foods such as gelatin or soup. Progress to regular foods as tolerated. Avoid greasy, spicy, heavy foods. If nausea and/or vomiting occur, drink only clear liquids until the nausea and/or vomiting subsides. Call your physician if vomiting  continues.  Special Instructions/Symptoms: Your throat may feel dry or sore from the anesthesia or the breathing tube placed in your throat during surgery. If this causes discomfort, gargle with warm salt water . The discomfort should disappear within 24 hours.  If you had a scopolamine  patch placed behind your ear for the management of post- operative nausea and/or vomiting:  1. The medication in the patch is effective for 72 hours, after which it should be removed.  Wrap patch in a tissue and discard in the trash. Wash hands thoroughly with soap and water . 2. You may remove the patch earlier than 72 hours if you experience unpleasant side effects which may include dry mouth, dizziness or visual disturbances. 3. Avoid touching the patch. Wash your hands with soap and water  after contact with the patch.   Information  for Discharge Teaching: EXPAREL  (bupivacaine  liposome injectable suspension)   Pain relief is important to your recovery. The goal is to control your pain so you can move easier and return to your normal activities as soon as possible after your procedure. Your physician may use several types of medicines to manage pain, swelling, and more.  Your surgeon or anesthesiologist gave you EXPAREL (bupivacaine ) to help control your pain after surgery.  EXPAREL  is a local anesthetic designed to release slowly over an extended period of time to provide pain relief by numbing the tissue around the surgical site. EXPAREL  is designed to release pain medication over time and can control pain for up to 72 hours. Depending on how you respond to EXPAREL , you may require less pain medication during your recovery. EXPAREL  can help reduce or eliminate the need for opioids during the first few days after surgery when pain relief is needed the most. EXPAREL  is not an opioid and is not addictive. It does not cause sleepiness or sedation.   Important! A teal colored band has been placed on your arm with  the date, time and amount of EXPAREL  you have received. Please leave this armband in place for the full 96 hours following administration, and then you may remove the band. If you return to the hospital for any reason within 96 hours following the administration of EXPAREL , the armband provides important information that your health care providers to know, and alerts them that you have received this anesthetic.    Possible side effects of EXPAREL : Temporary loss of sensation or ability to move in the area where medication was injected. Nausea, vomiting, constipation Rarely, numbness and tingling in your mouth or lips, lightheadedness, or anxiety may occur. Call your doctor right away if you think you may be experiencing any of these sensations, or if you have other questions regarding possible side effects.  Follow all other discharge instructions given to you by your surgeon or nurse. Eat a healthy diet and drink plenty of water  or other fluids.Information for Discharge Teaching: EXPAREL  (bupivacaine  liposome injectable suspension)   Pain relief is important to your recovery. The goal is to control your pain so you can move easier and return to your normal activities as soon as possible after your procedure. Your physician may use several types of medicines to manage pain, swelling, and more.  Your surgeon or anesthesiologist gave you EXPAREL (bupivacaine ) to help control your pain after surgery.  EXPAREL  is a local anesthetic designed to release slowly over an extended period of time to provide pain relief by numbing the tissue around the surgical site. EXPAREL  is designed to release pain medication over time and can control pain for up to 72 hours. Depending on how you respond to EXPAREL , you may require less pain medication during your recovery. EXPAREL  can help reduce or eliminate the need for opioids during the first few days after surgery when pain relief is needed the most. EXPAREL  is not  an opioid and is not addictive. It does not cause sleepiness or sedation.   Important! A teal colored band has been placed on your arm with the date, time and amount of EXPAREL  you have received. Please leave this armband in place for the full 96 hours following administration, and then you may remove the band. If you return to the hospital for any reason within 96 hours following the administration of EXPAREL , the armband provides important information that your health care providers to know, and alerts them that you  have received this anesthetic.    Possible side effects of EXPAREL : Temporary loss of sensation or ability to move in the area where medication was injected. Nausea, vomiting, constipation Rarely, numbness and tingling in your mouth or lips, lightheadedness, or anxiety may occur. Call your doctor right away if you think you may be experiencing any of these sensations, or if you have other questions regarding possible side effects.  Follow all other discharge instructions given to you by your surgeon or nurse. Eat a healthy diet and drink plenty of water  or other fluids.

## 2023-10-04 ENCOUNTER — Telehealth: Payer: Self-pay

## 2023-10-04 ENCOUNTER — Encounter (HOSPITAL_BASED_OUTPATIENT_CLINIC_OR_DEPARTMENT_OTHER): Payer: Self-pay | Admitting: Plastic Surgery

## 2023-10-04 LAB — SURGICAL PATHOLOGY

## 2023-10-04 NOTE — Telephone Encounter (Signed)
 Patient called stating she is in a lot of pain and the pain medication that was prescribed is not working.  Patient is wanting something different for pain and the Ibuprofen  is not working.

## 2023-10-04 NOTE — Telephone Encounter (Signed)
 Spoke with Paige Fritz, she reports she is overall doing well, she does report that she has been using the oxycodone  occasionally, but does not like oxycodone  or strong pain medications.  She has been taking ibuprofen  800 mg.  She has not been taking any Tylenol .  Discussed with patient to alternate Tylenol  and ibuprofen , discussed 800 ibuprofen  every 8 hours, thousand of Tylenol  every 8 hours.  Discussed alternating these 2.  Discussed using oxycodone  as needed for breakthrough pain.  She is going to try this.  We also discussed Nitropaste to bilateral nipple areolas.

## 2023-10-11 ENCOUNTER — Ambulatory Visit: Payer: Commercial Managed Care - PPO | Admitting: Plastic Surgery

## 2023-10-11 ENCOUNTER — Encounter: Payer: Self-pay | Admitting: Plastic Surgery

## 2023-10-11 DIAGNOSIS — N62 Hypertrophy of breast: Secondary | ICD-10-CM

## 2023-10-11 MED ORDER — KETOROLAC TROMETHAMINE 10 MG PO TABS: 10.0000 mg | ORAL_TABLET | Freq: Four times a day (QID) | ORAL | 0 refills | Status: AC | PRN

## 2023-10-11 NOTE — Progress Notes (Signed)
   Subjective:    Patient ID: Paige Fritz, female    DOB: 1990/11/07, 33 y.o.   MRN: [id number]  The patient is a 33 year old female here for follow-up after undergoing bilateral breast reduction.  She had over 1000 g removed from both breasts on May 29.  Overall she is doing really well.  She does have some bruising and swelling as expected.  No sign of a hematoma or seroma.  I think the binder is a little bit tight and I have requested she go into a sports bra.  She had some concerns about some drainage on the left breast around the areola.  I do not see anything concerning today.  She states that she has a headache particularly at night.  I have encouraged her to reach out to her PCP.  Does not sound like she is dehydrated as she is going to the bathroom and drinking a lot of water .  Most of the pain in the breast areas on the lateral aspect.    Review of Systems  Constitutional: Negative.   Eyes: Negative.   Respiratory: Negative.    Cardiovascular: Negative.   Gastrointestinal: Negative.        Objective:   Physical Exam Cardiovascular:     Pulses: Normal pulses.  Pulmonary:     Effort: Pulmonary effort is normal.  Abdominal:     Palpations: Abdomen is soft.  Skin:    General: Skin is warm.     Capillary Refill: Capillary refill takes less than 2 seconds.  Neurological:     Mental Status: She is oriented to person, place, and time.  Psychiatric:        Mood and Affect: Mood normal.        Behavior: Behavior normal.        Thought Content: Thought content normal.        Judgment: Judgment normal.         Assessment & Plan:     ICD-10-CM   1. Symptomatic mammary hypertrophy  N62       I will send in Toradol  and I have encouraged her to go into a sports bra that is not quite as tight as the one she has on now.  Will plan to see her back in 2 weeks.  I remove the dressings and encouraged her to use Vashe for cleaning and continue with the TOPA foam.

## 2023-10-16 ENCOUNTER — Telehealth: Payer: Self-pay | Admitting: Physician Assistant

## 2023-10-16 NOTE — Telephone Encounter (Signed)
 Spoke with patient, she reports feeling heaviness and firmness of bilateral breast.  She reports she switched from her breast binder to sports bra after her last appointment and feels as if she noticed increased swelling since then as well as some heaviness.  She does report that she has had some drainage from her nipple incisions.  She is not having any infectious symptoms or any other concerning symptoms.  Offered patient appointment tomorrow in the a.m., will plan to see her at 10 AM for follow-up and evaluation.  She is encouraged to call if her symptoms change or worsen or she develops any worsening symptoms.

## 2023-10-16 NOTE — Telephone Encounter (Signed)
 Patient called and would like a call back regarding some symptoms she is having.  Patient stated breasts are feeling very heavy and she is having a lot of pressure.  She also stated that she has some hard areas in her breast and wanted to know if there was anything she can do to help that.  Left breast is worse than the right.  The hard areas are near the incision so she does not want to touch it and cause a problem.  She has tried massaging around the breasts.  Please call her at [phone]. Sx was 10/03/2023

## 2023-10-17 ENCOUNTER — Ambulatory Visit: Admitting: Surgical

## 2023-10-17 VITALS — BP 100/70 | HR 92

## 2023-10-17 DIAGNOSIS — M546 Pain in thoracic spine: Secondary | ICD-10-CM

## 2023-10-17 DIAGNOSIS — M542 Cervicalgia: Secondary | ICD-10-CM

## 2023-10-17 DIAGNOSIS — G8929 Other chronic pain: Secondary | ICD-10-CM

## 2023-10-17 DIAGNOSIS — N62 Hypertrophy of breast: Secondary | ICD-10-CM

## 2023-10-17 NOTE — Progress Notes (Signed)
 Patient is a very pleasant 33 year old female here for follow-up after breast reduction with Dr. Orin Birk on 10/03/2023.  She called the office yesterday noting some increased discomfort and changes to her breast.  She was offered appointment for today.  Today she reports she is overall doing well, reports some discomfort in various areas, specifically lateral breast.  She reports she has been applying Vashe soaked gauze to entire breast area including over the Steri-Strips.  She reports she has noticed drainage over the left nipple.  She is not having any infectious symptoms at this time.  She does report some occasional chills, but no fevers.  Chaperone present on exam On exam bilateral breast incisions overall appear intact, she does have Steri-Strips in place obstructing view of entire incisions.  They overall appear intact otherwise.  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  Extent is difficult to determine at this time, however it seems to extend from the 9 to 1 o'clock position superiorly and just above the nipple.  There is no surrounding erythema or cellulitic changes.  She does have some hyperpigmentation of the peri-incisional areas. I do not appreciate any obvious subcutaneous fluid collections, however she does have some postsurgical swelling.  On exam of the right breast, NAC appears viable, no epidermal lysis or scabbing is noted.  Nipple areola is warm to touch.  There is no erythema or cellulitic changes of the right breast.  She does have some fullness of the lateral breast but no obvious large subcutaneous fluid collections with palpation.  There is minimal tension on the incisions.  A/P:  Recommend Vaseline and gauze to left nipple areolar complex daily, recommend Vashe soaked gauze to left NAC daily between dressing changes.    She can use soft dressings over the bilateral breast incisions to assist with collecting any drainage.  We did  replace Mepilex border dressings today, but recommend removing these in about 2 days.  There is no overt signs of infection on exam.  We did discuss that she has some partial nipple areolar necrosis on the left, discussed that it may worsen before it improves and that we should reevaluate next week and she should notify us  of any changes in the interim.  We discussed the healing process for the left nipple will likely result in reepithelialization and repigmentation over time.  Full pigmentation may not return.    Pictures were obtained of the patient and placed in the chart with the patient's or guardian's permission.

## 2023-10-18 ENCOUNTER — Encounter: Payer: Self-pay | Admitting: Student

## 2023-10-18 NOTE — Progress Notes (Unsigned)
 Patient call the on-call service today with concerns about her left nipple. She states that she was seen in clinic yesterday and was advised to apply Vaseline to her nipple. She states that since yesterday, a lot of the eschar/ scabbing has come off and now has exposed some yellow appearing tissue. She states that she is not having any drainage. She does not report any other concerns. Patient did send a photo. I reviewed the photo, although there was some limitation to how much could be seen in the photo. It did appear that there was some yellowish exudate/ non-viable tissue noted to the the superior/ medial aspect of the left nipple. There do not appear to be any surrounding erythema.   I discussed with the patient that the tissue that she is seeing is most likely non-viable tissue or exudate. I recommended that she continue with her dressing changes as prescribed at her appointment yesterday. I also discussed with her that she should monitor her symptoms and the area. If she feels that if anything is getting worse she should call the on-call service back. She expressed understanding.   I recommended that the patient follow back up in our clinic on Monday. She was in agreement with this plan.   I told her to not hesitate to call if she has any further questions or concerns before them.

## 2023-10-21 ENCOUNTER — Encounter: Payer: Self-pay | Admitting: Student

## 2023-10-21 ENCOUNTER — Encounter: Admitting: Student

## 2023-10-21 ENCOUNTER — Telehealth: Payer: Self-pay

## 2023-10-21 ENCOUNTER — Ambulatory Visit: Admitting: Student

## 2023-10-21 VITALS — BP 123/83 | HR 98

## 2023-10-21 DIAGNOSIS — N62 Hypertrophy of breast: Secondary | ICD-10-CM

## 2023-10-21 NOTE — Progress Notes (Deleted)
 Patient is a 33 year old female who underwent bilateral breast reduction with Dr. Orin Birk on 10/03/2023.  Patient is about 2-1/2 weeks postop.  She presents to the clinic today for postoperative follow-up.  Patient was last seen in the clinic on 10/17/2023.  At this visit, patient was overall doing well.  On exam, incisions were overall intact and healing well.  The left NAC appeared to have some epidermal lysis and necrosis.  It seemed to extend from the 9:00 to the 1 o'clock position.  Right NAC appeared to be healthy.  It was recommended that patient apply Vaseline and gauze to the left NAC daily and Vashe soaked gauze to the left NAC between dressing changes.  Today,

## 2023-10-21 NOTE — Telephone Encounter (Addendum)
 Faxed wound supply order including demographics, ins card and most recent ov note to [facility]  to request abd pads. Received fax success confirmation. Original document sent to front desk for batch scanning.

## 2023-10-21 NOTE — Progress Notes (Signed)
 Patient is a 33 year old female who underwent bilateral breast reduction with Dr. Orin Birk on 10/03/2023.  Patient is about 2-1/2 weeks postop.  She presents to the clinic today for postoperative follow-up.  Patient was last seen in the clinic on 10/17/2023.  At this visit, patient was overall doing well.  On exam, incisions were overall intact and healing well.  The left NAC appeared to have some epidermal lysis and necrosis.  It seemed to extend from the 9:00 to the 1 o'clock position.  Right NAC appeared to be healthy.  It was recommended that patient apply Vaseline and gauze to the left NAC daily and Vashe soaked gauze to the left NAC between dressing changes.  Today, patient reports she is doing well.  She states that she has been changing her dressings as discussed on Friday.  She reports that some of the nonviable tissue has been coming off with the dressing changes.  She denies any fevers or chills.  She does not report any drainage from her breast.  She does state that her breasts do feel a little bit heavy from time to time, especially when she is trying to sleep at night.  She reports she has been wearing her compression at all times.  Patient does state that she has had some sensitivities to her nipples bilaterally.  She also states that she has had some soreness along her incisions, but states that the big ABD pad covering these areas has been significantly helping with this pain.  Chaperone present on exam.  On exam, patient is sitting upright in no acute distress.  Breasts are overall soft and symmetric.  There is a little bit of firmness noted to the inferior aspect of the right breast consistent with scar tissue versus fat necrosis.  There are no obvious fluid collections palpated on exam.  There is no overlying erythema to either breast.  Right NAC appears to be healthy.  To the left NAC, inferior/lateral portion does appear to be still viable.  There is some exudate noted to the  superior/medial portion with some healthy appearing tissue surrounding the exudate.  Steri-Strips were intact over the incisions.  These were removed without any difficulty.  Incisions overall appear to be intact and healing well.  There is a very small superficial wound noted at the inferior T-zone of the left breast.  There are no signs of infection on exam.   I placed Xeroform on patient's left nipple today.  Discussed with her that she can continue to apply Xeroform once a day to her left nipple until she runs out, and then return to her Vaseline dressing changes that she has been.  Recommended also that she apply Vaseline to the remainder of her incisions including the small wound.  Patient expressed understanding.  Recommended that patient gently massage her breasts, especially the areas of firmness.  Patient expressed understanding.  Discussed with patient to continue to wear compression at all times.  Recommended she try some melatonin if she is having difficulty sleeping.  Patient expressed understanding.  Patient to follow back up at her next scheduled appointment.  I instructed her to call in the meantime if she has any questions or concerns about anything.  Pictures were obtained of the patient and placed in the chart with the patient's or guardian's permission.

## 2023-10-22 ENCOUNTER — Encounter: Payer: Commercial Managed Care - PPO | Admitting: Physician Assistant

## 2023-10-22 DIAGNOSIS — S21001A Unspecified open wound of right breast, initial encounter: Secondary | ICD-10-CM | POA: Diagnosis not present

## 2023-10-28 ENCOUNTER — Telehealth: Payer: Self-pay | Admitting: Student

## 2023-10-28 ENCOUNTER — Encounter: Payer: Self-pay | Admitting: Student

## 2023-10-28 NOTE — Telephone Encounter (Signed)
 Pt wants Elenteny to give her a call, she wants to thanks her, she said it is no rush and if she couldn't call maybe the CMA she stated, she just wanted to thank her.

## 2023-10-28 NOTE — Telephone Encounter (Signed)
 I just called and spoke with patient. Paige Fritz, would you please send Paige Fritz a letter through MyChart stating that she may return to work light duty at this time. Patient is unable to lift / push / pull / carry anything greater than 15 pounds or lift heavy objects above Paige Fritz head.   Thank you, Paige Fritz

## 2023-10-28 NOTE — Telephone Encounter (Signed)
 Thank you :)

## 2023-10-30 ENCOUNTER — Encounter: Payer: Self-pay | Admitting: Student

## 2023-10-30 NOTE — Telephone Encounter (Signed)
 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 Light duty from now until 11/14/23. Surgery was on 10/03/23. Restrictions include no pushing / pulling / carrying >15 lbs and no weighted overhead motions.   Thank you, Estefana

## 2023-10-31 NOTE — Progress Notes (Signed)
 Patient is a pleasant 33 year old female s/p bilateral breast reduction with liposuction 10/03/2023 with Dr. Lowery who presents to clinic for postoperative follow-up.  She was last seen here in clinic on 10/21/2023.  At that time, there were some incisional wounds noted along the areola.  Additional inferior T zone left breast wound.  Xeroform placed on patient's left nipple.  Recommending continued Vaseline to the remainder of the incisions.  Today, patient tells me that the nipple has made excellent progress since last encounter.  Some of the areas of firmness on the underside of her breasts have softened, but she tells me that she remains firm on the left breast.  She also is disappointed with the size, hoping that she would have been smaller.  Patient inquires about what can be done.  She continues to apply Vaseline to her nipple and remainder of incisions.  On exam, breasts have excellent shape and symmetry.  There is mild firmness palpated in the area of left inferior T zone, but no appreciable fluid collection.  It is discrete, likely scarring versus fat necrosis.  No significant tenderness or overlying skin changes.  Left NAC with good epithelialization around the periphery.  There is some creasing along the lateral aspect of NAC.  She is a bit bothered by how the left nipple is scarring as well as the ongoing size of her breast.  However, she tells me that she wants to lose an additional 35 pounds.  Patient believes that she will not have any reduction in breast size with her ongoing weight loss journey which has her concerned.  Recommending continued Vaseline to her incisions throughout and follow-up in 2 weeks for likely final postoperative encounter.  At that time, we will likely transition her to silicone scar gels and continued mechanical massage of the area of firmness on underside of left breast.  Will obtain photos at that time.

## 2023-11-04 ENCOUNTER — Ambulatory Visit (INDEPENDENT_AMBULATORY_CARE_PROVIDER_SITE_OTHER): Payer: Commercial Managed Care - PPO | Admitting: Physician Assistant

## 2023-11-04 VITALS — BP 111/68 | HR 88

## 2023-11-04 DIAGNOSIS — N62 Hypertrophy of breast: Secondary | ICD-10-CM

## 2023-11-04 DIAGNOSIS — Z9889 Other specified postprocedural states: Secondary | ICD-10-CM

## 2023-11-05 ENCOUNTER — Ambulatory Visit (INDEPENDENT_AMBULATORY_CARE_PROVIDER_SITE_OTHER): Admitting: Family Medicine

## 2023-11-05 ENCOUNTER — Encounter (INDEPENDENT_AMBULATORY_CARE_PROVIDER_SITE_OTHER): Payer: Self-pay | Admitting: Family Medicine

## 2023-11-05 VITALS — BP 98/64 | HR 88 | Temp 98.3°F | Ht 61.0 in | Wt 174.0 lb

## 2023-11-05 DIAGNOSIS — F5089 Other specified eating disorder: Secondary | ICD-10-CM | POA: Diagnosis not present

## 2023-11-05 DIAGNOSIS — Z6832 Body mass index (BMI) 32.0-32.9, adult: Secondary | ICD-10-CM

## 2023-11-05 DIAGNOSIS — E669 Obesity, unspecified: Secondary | ICD-10-CM | POA: Diagnosis not present

## 2023-11-05 DIAGNOSIS — E66812 Obesity, class 2: Secondary | ICD-10-CM

## 2023-11-05 DIAGNOSIS — E7849 Other hyperlipidemia: Secondary | ICD-10-CM

## 2023-11-05 MED ORDER — LOMAIRA 8 MG PO TABS: 12.0000 mg | ORAL_TABLET | Freq: Every day | ORAL | 0 refills | Status: DC

## 2023-11-05 MED ORDER — SIMVASTATIN 20 MG PO TABS: 20.0000 mg | ORAL_TABLET | Freq: Every day | ORAL | 0 refills | Status: AC

## 2023-11-05 MED ORDER — TOPIRAMATE 50 MG PO TABS: 75.0000 mg | ORAL_TABLET | Freq: Every day | ORAL | 0 refills | Status: DC

## 2023-11-05 NOTE — Progress Notes (Signed)
 SUBJECTIVE:  Chief Complaint: Obesity  Interim History: Patient underwent breast reduction surgery at the end of May.  She has had complications healing and with the surgery.  Pain wise she had a significant amount of pain.  She has puckering along the lateral aspect of her left breast.  She has been craving mostly fruit and yogurt.  Her mother has also been in the hospital.  She had to drop her classes.  Paige Fritz is here to discuss her progress with her obesity treatment plan. She is on the Category 2 Plan and states she is following her eating plan approximately  40% of the time. She states she is walking and weights 20-30 minutes 2 times per week.   OBJECTIVE: Visit Diagnoses: Problem List Items Addressed This Visit       Other   Disorder of eating - Primary   Relevant Medications   Phentermine  HCl (LOMAIRA ) 8 MG TABS   topiramate  (TOPAMAX ) 50 MG tablet   Other Visit Diagnoses       Other hyperlipidemia       Relevant Medications   simvastatin  (ZOCOR ) 20 MG tablet     Obesity with starting BMI of 36.3       Relevant Medications   Phentermine  HCl (LOMAIRA ) 8 MG TABS     BMI 32.0-32.9,adult             11/05/2023   12:00 PM 11/04/2023   11:43 AM 10/21/2023    9:04 AM  Vitals with BMI  Height 5' 1    Weight 174 lbs    BMI 32.89    Systolic 98 111 123  Diastolic 64 68 83  Pulse 88 88 98    No data recorded  No data recorded  No data recorded  No data recorded    ASSESSMENT AND PLAN: Assessment & Plan Other disorder of eating On combination lomaira  and topiramate .  Starting weight was 213lb at end of May 2024.   And goal for 5% loss was 11lbs.  Patient is now at 174lbs which is a 39 lb loss.  PDMP checked with no concerns.  Refills for medications at current doses sent in Other hyperlipidemia Last labs done in February with elevated LDL.  Needs repeat labs with PCP or me in the next month.  She is working on limiting saturated fat intake to less than 20% of  her daily intake.  She is on Zocor  with no side effects.  Refill of zocor  sent to pharmacy. Obesity with starting BMI of 36.3  BMI 32.0-32.9,adult    Diet: Paige Fritz is currently in the action stage of change. As such, her goal is to continue with weight loss efforts and has agreed to keeping a food journal and adhering to recommended goals of 1100-1200 calories and 80 or more grams of protein or Category 2 plan.  Exercise:  No exercise has been prescribed at this time.  Behavior Modification:  We discussed the following Behavioral Modification Strategies today: increasing lean protein intake, decreasing simple carbohydrates, meal planning and cooking strategies, and planning for success. We discussed various medication options to help [name] with her weight loss efforts and we both agreed to continue her current medications of phentermine  and topiramate .  No follow-ups on file.   She was informed of the importance of frequent follow up visits to maximize her success with intensive lifestyle modifications for her multiple health conditions.  Attestation Statements:   Reviewed by clinician on day of visit: allergies, medications, problem  list, medical history, surgical history, family history, social history, and previous encounter notes.     Paige Cho, MD

## 2023-11-17 NOTE — Assessment & Plan Note (Signed)
 On combination lomaira  and topiramate .  Starting weight was 213lb at end of May 2024.   And goal for 5% loss was 11lbs.  Patient is now at 174lbs which is a 39 lb loss.  PDMP checked with no concerns.  Refills for medications at current doses sent in

## 2023-11-26 ENCOUNTER — Encounter: Payer: Self-pay | Admitting: Physician Assistant

## 2023-11-26 ENCOUNTER — Other Ambulatory Visit (INDEPENDENT_AMBULATORY_CARE_PROVIDER_SITE_OTHER): Payer: Self-pay | Admitting: Family Medicine

## 2023-11-26 ENCOUNTER — Ambulatory Visit: Admitting: Physician Assistant

## 2023-11-26 VITALS — BP 113/69 | HR 95

## 2023-11-26 DIAGNOSIS — N62 Hypertrophy of breast: Secondary | ICD-10-CM

## 2023-11-26 DIAGNOSIS — Z9889 Other specified postprocedural states: Secondary | ICD-10-CM

## 2023-11-26 DIAGNOSIS — E559 Vitamin D deficiency, unspecified: Secondary | ICD-10-CM

## 2023-11-26 NOTE — Progress Notes (Signed)
 Patient is a pleasant 33 year old female s/p bilateral breast reduction with liposuction 10/03/2023 with Dr. Lowery who presents to clinic for postoperative follow-up.   She was last seen here in clinic on 11/04/2023.  At that time, mild firmness palpated left inferior T-zone, but no appreciable fluid collection.  Discrete, likely scarring versus fat necrosis.  Left NAC with good epithelialization around periphery.  Some creasing out laterally.  She was bit bothered by the scarring as well as large size of her breasts, but does plan on losing an additional 35 pounds.  She is not convinced that she will have any reduction in breast size with her ongoing weight loss journey.  Recommended Vaseline or incisions throughout and follow-up in 2 weeks for likely final postoperative encounter.  Today, patient is doing okay.  She remains a bit bothered by her large breast size despite over 1 kg reduction on the side.  Patient also endorses ongoing areas of firmness, particular on the left breast which was complicated by incisional wounds and slightly delayed wound healing.  She also states that the left nipple is constantly erect whereas the right nipple is only erect periodically.  On exam, the breasts have excellent shape and symmetry.  NAC's are each healthy and viable.  The creasing on the lateral aspect of left NAC has improved slightly.  Right breast is largely soft throughout whereas the left breast has ongoing firmness, particular on the lateral aspect.  Continue to suspect fat necrosis as the cause for the firmness on the lateral aspect of left breast.  She also has some firmness over the inferior aspect of the vertical limb left breast which is likely scarring.  Recommended and emphasized the importance of continued mechanical massages regularly.  She will also continue with the silicone scar gel as twice daily x 3 months.  Follow-up with Dr. Lowery when she is at least 6 months postop if she is still  concerned about the outcome of her reduction and is interested in any surgical options, if any.  All questions answered and patient agreeable to the plan.  Picture(s) obtained of the patient and placed in the chart were with the patient's or guardian's permission.

## 2023-12-04 ENCOUNTER — Ambulatory Visit: Admitting: Dermatology

## 2023-12-04 ENCOUNTER — Encounter: Payer: Self-pay | Admitting: Dermatology

## 2023-12-04 VITALS — BP 115/75 | HR 96

## 2023-12-04 DIAGNOSIS — L720 Epidermal cyst: Secondary | ICD-10-CM

## 2023-12-04 DIAGNOSIS — L723 Sebaceous cyst: Secondary | ICD-10-CM

## 2023-12-04 MED ORDER — TRIAMCINOLONE ACETONIDE 40 MG/ML IJ SUSP
40.0000 mg | Freq: Once | INTRAMUSCULAR | Status: AC
  Administered 2023-12-04: 40 mg via INTRAMUSCULAR

## 2023-12-04 NOTE — Patient Instructions (Signed)

## 2023-12-04 NOTE — Progress Notes (Signed)
   New Patient Visit   Subjective  Paige Fritz is a 33 y.o. female who presents for the following: Spot Check  Patient states she has spot located at the back that she would like to have examined. Patient reports the areas have been there for 2 years. She reports the areas are not bothersome. Patient reports she has not previously been treated for these areas.   The following portions of the chart were reviewed this encounter and updated as appropriate: medications, allergies, medical history  Review of Systems:  No other skin or systemic complaints except as noted in HPI or Assessment and Plan.  Objective  Well appearing patient in no apparent distress; mood and affect are within normal limits.  A focused examination was performed of the following areas: Back  Relevant exam findings are noted in the Assessment and Plan.       1 cm subcutaneous nodule 0.5 cc 1 injection   Assessment & Plan   1. Inflamed Epidermal Inclusion Cyst - Assessment: Patient presents with a cyst on her back, approximately 1 cm in size, with a punctum. The cyst is more significant than a blackhead and will refill even if squeezed. Surgical removal is not recommended due to the risk of a large scar that might keloid.  - Plan:    Administer intralesional Kenalog  injection (0.05 mL) to shrink the cyst    Instruct patient:     - Keep band-aid on for 24-48 hours post-injection     - Expect possible temporary increase in size and discomfort for 24 hours post-injection     - Anticipate gradual shrinkage over 8 weeks     - Avoid touching or squeezing the cyst for 6-8 weeks    Consider repeat injection if cyst is not flat at 8-week follow-up  Follow-up in 8 weeks for reassessment and comparison of photos.  Procedure Note Intralesional Injection  Location: back  Informed Consent: Discussed risks (infection, pain, bleeding, bruising, thinning of the skin, loss of skin pigment, lack of resolution, and  recurrence of lesion) and benefits of the procedure, as well as the alternatives. Informed consent was obtained. Preparation: The area was prepared a standard fashion.  Anesthesia:none  Procedure Details: An intralesional injection was performed with Kenalog  40 mg/cc. 0.05 cc in total were injected.  Total number of injections: 1  Plan: The patient was instructed on post-op care. Recommend OTC analgesia as needed for pain.  INFLAMED SEBACEOUS CYST   Related Medications triamcinolone  acetonide (KENALOG -40) injection 40 mg   Return in about 8 weeks (around 01/29/2024) for Cyst F/U.  I, Paige Fritz, am acting as Neurosurgeon for [employer], DO.  Documentation: I have reviewed the above documentation for accuracy and completeness, and I agree with the above.  Delon Lenis, DO

## 2023-12-05 ENCOUNTER — Ambulatory Visit (INDEPENDENT_AMBULATORY_CARE_PROVIDER_SITE_OTHER): Admitting: Family Medicine

## 2023-12-05 ENCOUNTER — Encounter (INDEPENDENT_AMBULATORY_CARE_PROVIDER_SITE_OTHER): Payer: Self-pay | Admitting: Family Medicine

## 2023-12-05 VITALS — BP 98/63 | HR 88 | Temp 97.9°F | Ht 61.0 in | Wt 177.0 lb

## 2023-12-05 DIAGNOSIS — E669 Obesity, unspecified: Secondary | ICD-10-CM | POA: Diagnosis not present

## 2023-12-05 DIAGNOSIS — E559 Vitamin D deficiency, unspecified: Secondary | ICD-10-CM | POA: Diagnosis not present

## 2023-12-05 DIAGNOSIS — Z6833 Body mass index (BMI) 33.0-33.9, adult: Secondary | ICD-10-CM

## 2023-12-05 DIAGNOSIS — F5089 Other specified eating disorder: Secondary | ICD-10-CM | POA: Diagnosis not present

## 2023-12-05 DIAGNOSIS — E66812 Morbid (severe) obesity due to excess calories: Secondary | ICD-10-CM

## 2023-12-05 MED ORDER — VITAMIN D (ERGOCALCIFEROL) 1.25 MG (50000 UNIT) PO CAPS
50000.0000 [IU] | ORAL_CAPSULE | ORAL | 0 refills | Status: DC
Start: 2023-12-05 — End: 2024-02-05

## 2023-12-05 NOTE — Progress Notes (Signed)
   SUBJECTIVE:  Chief Complaint: Obesity  Interim History: patient here for follow up.  She mentions she has not been eating as well as she did previously.  She is still healing from her breast reduction and is off restrictions for work. She is taking a semester off. She has been doing some weight training at home and got 5lb weights because she already had 6lbs at home.   Vi is here to discuss her progress with her obesity treatment plan. She is on the fasting and states she is following her eating plan approximately 60 % of the time. She states she is exercising 45 minutes 3 times per week.   OBJECTIVE: Visit Diagnoses: Problem List Items Addressed This Visit   None   Vitals Temp: 97.9 F (36.6 C) BP: 98/63 Pulse Rate: 88 SpO2: 100 %        ASSESSMENT AND PLAN: Assessment & Plan Vitamin D  deficiency Patient needs refill of vitamin D  today.  Last lab value done in February of this year.  Will need repeat labs in the next 2 months to reevaluate treatment. Other disorder of eating Stable on combination phentermine  and topiramate .  Patient has lost more than designated 5% goal.  Does not need refills of these medications at this time. Obesity with starting BMI of 36.3 Anthropometric Measurements Height: 5' 1 (1.549 m) Weight: 177 lb (80.3 kg) BMI (Calculated): 33.46 Weight at Last Visit: 174lb Weight Lost Since Last Visit: 0 Weight Gained Since Last Visit: 3lb Starting Weight: 192lb Total Weight Loss (lbs): 15 lb (6.804 kg) Peak Weight: 217lb Body Composition  Body Fat %: 39.2 % Fat Mass (lbs): 69.4 lbs Muscle Mass (lbs): 102.4 lbs Total Body Water  (lbs): 75.6 lbs Visceral Fat Rating : 8 Other Clinical Data Fasting: no Labs: no Today's Visit #: 19 Starting Date: 06/21/21 Comments: Cat 2  BMI 33.0-33.9,adult    Diet: [employer] is currently in the action stage of change. As such, her goal is to continue with weight loss efforts and has agreed to the  Category 2 Plan and keeping a food journal and adhering to recommended goals of [phone] calories and 85 or more grams of protein.   Exercise:  For substantial health benefits, adults should do at least 150 minutes (2 hours and 30 minutes) a week of moderate-intensity, or 75 minutes (1 hour and 15 minutes) a week of vigorous-intensity aerobic physical activity, or an equivalent combination of moderate- and vigorous-intensity aerobic activity. Aerobic activity should be performed in episodes of at least 10 minutes, and preferably, it should be spread throughout the week.  Behavior Modification:  We discussed the following Behavioral Modification Strategies today: increasing lean protein intake, decreasing simple carbohydrates, increasing vegetables, meal planning and cooking strategies, and planning for success. We discussed various medication options to help [facility] with her weight loss efforts and we both agreed to continue phentermine  and topiramate  at current dose.  No follow-ups on file.   She was informed of the importance of frequent follow up visits to maximize her success with intensive lifestyle modifications for her multiple health conditions.  Attestation Statements:   Reviewed by clinician on day of visit: allergies, medications, problem list, medical history, surgical history, family history, social history, and previous encounter notes.    [name], MD

## 2023-12-12 NOTE — Assessment & Plan Note (Signed)
 Patient needs refill of vitamin D  today.  Last lab value done in February of this year.  Will need repeat labs in the next 2 months to reevaluate treatment.

## 2023-12-17 ENCOUNTER — Other Ambulatory Visit: Payer: Self-pay

## 2023-12-17 ENCOUNTER — Inpatient Hospital Stay: Attending: Physician Assistant

## 2023-12-17 DIAGNOSIS — Z8349 Family history of other endocrine, nutritional and metabolic diseases: Secondary | ICD-10-CM | POA: Insufficient documentation

## 2023-12-17 DIAGNOSIS — Z86018 Personal history of other benign neoplasm: Secondary | ICD-10-CM | POA: Insufficient documentation

## 2023-12-17 DIAGNOSIS — Z5986 Financial insecurity: Secondary | ICD-10-CM | POA: Insufficient documentation

## 2023-12-17 DIAGNOSIS — D509 Iron deficiency anemia, unspecified: Secondary | ICD-10-CM | POA: Diagnosis not present

## 2023-12-17 DIAGNOSIS — R5383 Other fatigue: Secondary | ICD-10-CM | POA: Diagnosis not present

## 2023-12-17 DIAGNOSIS — N840 Polyp of corpus uteri: Secondary | ICD-10-CM | POA: Diagnosis not present

## 2023-12-17 DIAGNOSIS — Z8249 Family history of ischemic heart disease and other diseases of the circulatory system: Secondary | ICD-10-CM | POA: Diagnosis not present

## 2023-12-17 DIAGNOSIS — D259 Leiomyoma of uterus, unspecified: Secondary | ICD-10-CM | POA: Diagnosis not present

## 2023-12-17 DIAGNOSIS — K5900 Constipation, unspecified: Secondary | ICD-10-CM | POA: Diagnosis not present

## 2023-12-17 DIAGNOSIS — D500 Iron deficiency anemia secondary to blood loss (chronic): Secondary | ICD-10-CM

## 2023-12-17 DIAGNOSIS — Z79899 Other long term (current) drug therapy: Secondary | ICD-10-CM | POA: Insufficient documentation

## 2023-12-17 LAB — CBC WITH DIFFERENTIAL (CANCER CENTER ONLY)
Abs Immature Granulocytes: 0.01 K/uL (ref 0.00–0.07)
Basophils Absolute: 0 K/uL (ref 0.0–0.1)
Basophils Relative: 1 %
Eosinophils Absolute: 0.1 K/uL (ref 0.0–0.5)
Eosinophils Relative: 2 %
HCT: 36.2 % (ref 36.0–46.0)
Hemoglobin: 12.3 g/dL (ref 12.0–15.0)
Immature Granulocytes: 0 %
Lymphocytes Relative: 48 %
Lymphs Abs: 2.7 K/uL (ref 0.7–4.0)
MCH: 30.8 pg (ref 26.0–34.0)
MCHC: 34 g/dL (ref 30.0–36.0)
MCV: 90.5 fL (ref 80.0–100.0)
Monocytes Absolute: 0.3 K/uL (ref 0.1–1.0)
Monocytes Relative: 6 %
Neutro Abs: 2.4 K/uL (ref 1.7–7.7)
Neutrophils Relative %: 43 %
Platelet Count: 322 K/uL (ref 150–400)
RBC: 4 MIL/uL (ref 3.87–5.11)
RDW: 13.2 % (ref 11.5–15.5)
WBC Count: 5.7 K/uL (ref 4.0–10.5)
nRBC: 0 % (ref 0.0–0.2)

## 2023-12-18 LAB — IRON AND IRON BINDING CAPACITY (CC-WL,HP ONLY)
Iron: 49 ug/dL (ref 28–170)
Saturation Ratios: 17 % (ref 10.4–31.8)
TIBC: 288 ug/dL (ref 250–450)
UIBC: 239 ug/dL (ref 148–442)

## 2023-12-18 LAB — FERRITIN: Ferritin: 112 ng/mL (ref 11–307)

## 2023-12-19 ENCOUNTER — Inpatient Hospital Stay: Payer: Commercial Managed Care - PPO

## 2023-12-20 ENCOUNTER — Inpatient Hospital Stay: Payer: Commercial Managed Care - PPO | Admitting: Physician Assistant

## 2023-12-20 ENCOUNTER — Other Ambulatory Visit: Payer: Commercial Managed Care - PPO

## 2023-12-20 DIAGNOSIS — D500 Iron deficiency anemia secondary to blood loss (chronic): Secondary | ICD-10-CM

## 2023-12-20 NOTE — Progress Notes (Signed)
 [facility] Telephone:([phone]) [phone]   [phone]  PROGRESS NOTE  I connected with Paige Fritz  on 12/20/23 by telephone visit and verified that I am speaking with the correct person using two identifiers.   I discussed the limitations, risks, security and privacy concerns of performing an evaluation and management service by telemedicine and the availability of in-person appointments. I also discussed with the patient that there may be a patient responsible charge related to this service. The patient expressed understanding and agreed to proceed.  Patient's location: Home Provider's location: Office  Patient Care Team: Wendolyn Jenkins Jansky, MD as PCP - General (Family Medicine)   Hematological/Oncological History 1) Labs from PCP, Dr. [name]: -06/21/2021: WBC 5.5, Hgb 8.6 (L), MCV 77 (L), Plt 356K, Vitamin B12 674, Folate 11.1  2) 07/11/2021: Establish care with [facility] Hematology/Oncology  3) 07/21/2021: Received IV monoferric  1000 mg x 1 dose  4) 12/19/2021-01/16/2022: Received IV venofer  200 mg x 5 doses  5)  10/06/2022-10/27/2022: Received IV venofer  200 mg x 5 doses  6) 11/02/2022: Underwent RFA of uterine fibroids and resection of endometrial polyps, D&C   7) 06/29/2023: Received IV feraheme  510 mg x 1 dose  CHIEF COMPLAINTS/PURPOSE OF CONSULTATION:  Iron  deficiency anemia  HISTORY OF PRESENTING ILLNESS:  Paige Fritz 33 y.o. female returns for a follow up for iron  deficiency anemia. She was last seen on 06/11/2023 and in the interim, she underwent breast reduction on 10/03/2023.   Ms. Tatro reports her energy levels are fairly stable. She has some fatigue but suspects this could be due to not taking her vitamin D  supplement regularly. She is able to complete all her ADLs on her own.  She reports her menstrual cycles have greatly improved after undergoing uterine ablation back in 2024.  She reports her cycles last approximately 4  days with 1 day of heavy bleeding.  She denies any other signs of bleeding.She is otherwise feeling well without fevers, chills, night sweats, shortness of breath, chest pain, cough, nausea, vomiting, diarrhea or constipation.  She has no other complaints.  Rest of the 10 point ROS is below.  MEDICAL HISTORY:  Past Medical History:  Diagnosis Date   Anxiety    Chronic back pain    per pt due to large breast   Chronic headaches    per pt due to back pain   Intramural and subserous leiomyoma of uterus    Iron  deficiency anemia due to chronic blood loss 2015   hematology/ oncologist--- johnston dickens PA;   treated w/ iron  infusions   Menorrhagia with regular cycle    Vitamin D  deficiency     SURGICAL HISTORY: Past Surgical History:  Procedure Laterality Date   BREAST REDUCTION SURGERY Bilateral 10/03/2023   Procedure: BREAST REDUCTION WITH LIPOSUCTION;  Surgeon: Lowery Estefana RAMAN, DO;  Location: [facility];  Service: Plastics;  Laterality: Bilateral;   HYSTEROSCOPY WITH D & C  11/02/2022   Procedure: DILATATION AND CURETTAGE /HYSTEROSCOPY;  Surgeon: Rutherford Gain, MD;  Location: [facility];  Service: Gynecology;;   WISDOM TOOTH EXTRACTION  2019    SOCIAL HISTORY: Social History   Socioeconomic History   Marital status: Single    Spouse name: Not on file   Number of children: 0   Years of education: Not on file   Highest education level: Associate degree: occupational, Scientist, product/process development, or vocational program  Occupational History   Occupation: CNA, Associate Professor, [employer], Consulting civil engineer   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    Employer: [employer]  Tobacco Use   Smoking status: Never    Passive exposure: Never   Smokeless tobacco: Never  Vaping Use   Vaping status: Never Used  Substance and Sexual Activity   Alcohol use: Not Currently   Drug use: Never   Sexual activity: Yes    Birth control/protection: None  Other Topics Concern   Not on file   Social History Narrative   Not on file   Social Drivers of Health   Financial Resource Strain: High Risk (06/17/2023)   Overall Financial Resource Strain (CARDIA)    Difficulty of Paying Living Expenses: Hard  Food Insecurity: Food Insecurity Present (06/17/2023)   Hunger Vital Sign    Worried About Running Out of Food in the Last Year: Never true    Ran Out of Food in the Last Year: Often true  Transportation Needs: No Transportation Needs (06/17/2023)   PRAPARE - Administrator, Civil Service (Medical): No    Lack of Transportation (Non-Medical): No  Physical Activity: Sufficiently Active (06/17/2023)   Exercise Vital Sign    Days of Exercise per Week: 7 days    Minutes of Exercise per Session: 30 min  Stress: No Stress Concern Present (06/17/2023)   [employer] of Occupational Health - Occupational Stress Questionnaire    Feeling of Stress : Only a little  Social Connections: Socially Integrated (06/17/2023)   Social Connection and Isolation Panel    Frequency of Communication with Friends and Family: More than three times a week    Frequency of Social Gatherings with Friends and Family: More than three times a week    Attends Religious Services: More than 4 times per year    Active Member of [employer] or Organizations: Yes    Attends Engineer, structural: More than 4 times per year    Marital Status: Living with partner  Intimate Partner Violence: Not on file    FAMILY HISTORY: Family History  Problem Relation Age of Onset   Obesity Mother    Hypertension Father     ALLERGIES:  has no known allergies.  MEDICATIONS:  Current Outpatient Medications  Medication Sig Dispense Refill   ibuprofen  (ADVIL ) 800 MG tablet Take 800 mg by mouth every 8 (eight) hours as needed.     Phentermine  HCl (LOMAIRA ) 8 MG TABS Take 1.5 tablets (12 mg total) by mouth daily. 45 tablet 0   polyethylene glycol powder (GLYCOLAX /MIRALAX ) 17 GM/SCOOP powder Take 17 g by mouth  daily.     simvastatin  (ZOCOR ) 20 MG tablet Take 1 tablet (20 mg total) by mouth at bedtime. 90 tablet 0   topiramate  (TOPAMAX ) 50 MG tablet Take 1.5 tablets (75 mg total) by mouth daily. 90 tablet 0   triamcinolone  cream (KENALOG ) 0.1 % Apply 1 Application topically 2 (two) times daily. 30 g 0   Vitamin D , Ergocalciferol , (DRISDOL ) 1.25 MG (50000 UNIT) CAPS capsule Take 1 capsule (50,000 Units total) by mouth every 7 (seven) days. 12 capsule 0   No current facility-administered medications for this visit.    REVIEW OF SYSTEMS:   Constitutional: ( - ) fevers, ( - )  chills , ( - ) night sweats Eyes: ( - ) blurriness of vision, ( - ) double vision, ( - ) watery eyes Ears, nose, mouth, throat, and face: ( - ) mucositis, ( - ) sore throat Respiratory: ( - ) cough, ( -) dyspnea, ( - ) wheezes Cardiovascular: ( - )  palpitation, ( - ) chest discomfort, ( - ) lower extremity swelling Gastrointestinal:  ( - ) nausea, ( - ) heartburn, ( - ) change in bowel habits Skin: ( - ) abnormal skin rashes Lymphatics: ( - ) new lymphadenopathy, ( - ) easy bruising Neurological: ( - ) numbness, ( - ) tingling, ( - ) new weaknesses Behavioral/Psych: ( - ) mood change, ( - ) new changes  All other systems were reviewed with the patient and are negative.  PHYSICAL EXAMINATION: Did not complete due to virtual visit.  LABORATORY DATA:  I have reviewed the data as listed    Latest Ref Rng & Units 12/17/2023    4:00 PM 09/23/2023   12:38 PM 06/10/2023   12:54 PM  CBC  WBC 4.0 - 10.5 K/uL 5.7  5.8  6.3   Hemoglobin 12.0 - 15.0 g/dL 87.6  87.8  87.9   Hematocrit 36.0 - 46.0 % 36.2  34.4  36.0   Platelets 150 - 400 K/uL 322  337  342        Latest Ref Rng & Units 06/21/2023   10:10 AM 11/16/2022    3:55 PM 06/22/2022    2:23 PM  CMP  Glucose 70 - 99 mg/dL 85  78  84   BUN 6 - 23 mg/dL 11  12  11    Creatinine 0.40 - 1.20 mg/dL 9.15  9.20  9.28   Sodium 135 - 145 mEq/L 135  134  137   Potassium 3.5 - 5.1  mEq/L 4.5  3.8  3.9   Chloride 96 - 112 mEq/L 104  104  104   CO2 19 - 32 mEq/L 25  21  28    Calcium 8.4 - 10.5 mg/dL 9.0  9.1  9.3   Total Protein 6.0 - 8.3 g/dL 7.5  7.0  7.4   Total Bilirubin 0.2 - 1.2 mg/dL 0.9  0.3  0.3   Alkaline Phos 39 - 117 U/L 56   49   AST 0 - 37 U/L 13  14  13    ALT 0 - 35 U/L 7  11  8      ASSESSMENT & PLAN Paige Fritz is a 33 y.o. female returns for a follow up for iron  deficiency anemia.  #Iron  deficiency anemia 2/2 heavy menstrual bleeding --Unable to tolerate iron  tablets due to constipation --Underwent RFA of uterine fibroids and resection of endometrial polyps, D&C on 11/02/2022. --Last received IV Feraheme  x 1 dose in February 2025. --Labs from 12/17/2023 reviewed with the patient.No evidence of anemia with a hemoglobin of 12.3, MCV 90.5.  Iron  panel shows no deficiency with ferritin 112, iron  49, TIBC 288, saturation 17%.  --No indication for IV iron  at this time.  --RTC in 3 months for labs only and 6 months with labs/follow up  No orders of the defined types were placed in this encounter.   All questions were answered. The patient knows to call the clinic with any problems, questions or concerns.  I have spent a total of 25 minutes minutes of non-face-to-face time, preparing to see the patient, counseling and educating the patient,  documenting clinical information in the electronic health record,and care coordination.    Johnston Police, PA-C Department of Hematology/Oncology [facility] Phone: [phone]

## 2023-12-23 ENCOUNTER — Telehealth: Payer: Self-pay

## 2023-12-23 NOTE — Telephone Encounter (Signed)
 Note in error, information should have been put in patient's chart.

## 2023-12-23 NOTE — Telephone Encounter (Signed)
 I called patient she stated she spoke with Francina Friday  to see if she could get an appointment for her mother Paige Fritz sooner than January which wasn't available so her mother was added to the cancellation list.

## 2023-12-23 NOTE — Telephone Encounter (Signed)
 Patient left a VM stating she had her mother's Dickey Slade Cardiologist to resubmitted an urgent referral to see Dr. Jeannetta. Patient request call back.

## 2023-12-23 NOTE — Telephone Encounter (Signed)
 Note entered in error, information entered in patient's chart,

## 2023-12-23 NOTE — Telephone Encounter (Signed)
 What is patients D.O.B? We do not have urgent appts available at our office but we can always put the patient on wait list.

## 2023-12-25 ENCOUNTER — Encounter (INDEPENDENT_AMBULATORY_CARE_PROVIDER_SITE_OTHER): Payer: Self-pay | Admitting: Family Medicine

## 2023-12-25 ENCOUNTER — Ambulatory Visit (INDEPENDENT_AMBULATORY_CARE_PROVIDER_SITE_OTHER): Admitting: Family Medicine

## 2023-12-25 VITALS — BP 115/73 | HR 94 | Temp 98.2°F | Ht 61.0 in | Wt 173.0 lb

## 2023-12-25 DIAGNOSIS — F5089 Other specified eating disorder: Secondary | ICD-10-CM | POA: Diagnosis not present

## 2023-12-25 DIAGNOSIS — Z6832 Body mass index (BMI) 32.0-32.9, adult: Secondary | ICD-10-CM | POA: Diagnosis not present

## 2023-12-25 DIAGNOSIS — E669 Obesity, unspecified: Secondary | ICD-10-CM

## 2023-12-25 MED ORDER — TOPIRAMATE 50 MG PO TABS: 75.0000 mg | ORAL_TABLET | Freq: Every day | ORAL | 0 refills | Status: DC

## 2023-12-25 MED ORDER — LOMAIRA 8 MG PO TABS: 12.0000 mg | ORAL_TABLET | Freq: Every day | ORAL | 0 refills | Status: DC

## 2023-12-25 NOTE — Progress Notes (Signed)
 SUBJECTIVE:  Chief Complaint: Obesity  Interim History: Patient has had GI issues the last two to three days with no changes in intake quantity or choices.  She isn't eating out and hasn't been around ill people.  She is attributing this possible nerves about semester just starting.  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   She hasn't been able to walk or get out as much as she wanted to so she has been doing pilates and has enjoyed doing this. She is looking for other foods to eat because she is having to force herself to eat- not sure if she is getting bored with her intake.  Average calorie intake has been around 1000-110 calories a day.  Paige Fritz is here to discuss her progress with her obesity treatment plan. She is on the Category 2 Plan and keeping a food journal and adhering to recommended goals of 1100-1200 calories and 85 grams of protein and states she is following her eating plan approximately 50 % of the time. She states she is exercising 30 minutes for past 8 days.   OBJECTIVE: Visit Diagnoses: Problem List Items Addressed This Visit   None Visit Diagnoses       Obesity with starting BMI of 36.3    -  Primary   Relevant Medications   Phentermine  HCl (LOMAIRA ) 8 MG TABS     Other disorder of eating       Relevant Medications   topiramate  (TOPAMAX ) 50 MG tablet   Phentermine  HCl (LOMAIRA ) 8 MG TABS     BMI 32.0-32.9,adult           Vitals Temp: 98.2 F (36.8 C) BP: 115/73 Pulse Rate: 94 SpO2: 100 %   Anthropometric Measurements Height: 5' 1 (1.549 m) Weight: 173 lb (78.5 kg) BMI (Calculated): 32.7 Weight at Last Visit: 177 lb Weight Lost Since Last Visit: 4 Weight Gained Since Last Visit: 0 Starting Weight: 192 lb Peak Weight: 19 lb   Body Composition  Body Fat %: 39.6 % Fat Mass (lbs): 39.6 lbs Muscle Mass (lbs): 99.6 lbs Total Body Water  (lbs): 73.6 lbs Visceral Fat Rating : 8   Other Clinical Data Today's Visit #:  20 Starting Date: 06/21/21 Comments: Cat 2     ASSESSMENT AND PLAN: Assessment & Plan Other disorder of eating Patient started on combination phentermine  and topiramate  in May 2024 at 213lbs.  She has achieved 5% weight loss goal as patient is now at 173lbs.  PDMP checked with no concerns for early refills or polypharmacy.  Refill medications at same dose. Obesity with starting BMI of 36.3  BMI 32.0-32.9,adult    Diet: Paige Fritz is currently in the action stage of change. As such, her goal is to continue with weight loss efforts and has agreed to keeping a food journal and adhering to recommended goals of 1100-1200 calories and 85 or more grams of protein daily.   Exercise:  For substantial health benefits, adults should do at least 150 minutes (2 hours and 30 minutes) a week of moderate-intensity, or 75 minutes (1 hour and 15 minutes) a week of vigorous-intensity aerobic physical activity, or an equivalent combination of moderate- and vigorous-intensity aerobic activity. Aerobic activity should be performed in episodes of at least 10 minutes, and preferably, it should be spread throughout the week.  Behavior Modification:  We discussed the following Behavioral Modification Strategies today: increasing lean protein intake, decreasing simple carbohydrates, increasing vegetables, meal planning and cooking strategies,  and keep a strict food journal. We discussed various medication options to help [facility] with her weight loss efforts and we both agreed to continue current medication at same doses.  No follow-ups on file.   She was informed of the importance of frequent follow up visits to maximize her success with intensive lifestyle modifications for her multiple health conditions.  Attestation Statements:   Reviewed by clinician on day of visit: allergies, medications, problem list, medical history, surgical history, family history, social history, and previous encounter notes.      [name], MD

## 2023-12-31 NOTE — Assessment & Plan Note (Signed)
 Patient started on combination phentermine  and topiramate  in May 2024 at 213lbs.  She has achieved 5% weight loss goal as patient is now at 173lbs.  PDMP checked with no concerns for early refills or polypharmacy.  Refill medications at same dose.

## 2024-01-29 ENCOUNTER — Encounter: Payer: Self-pay | Admitting: Dermatology

## 2024-01-29 ENCOUNTER — Ambulatory Visit: Admitting: Dermatology

## 2024-01-29 VITALS — BP 128/86 | HR 95

## 2024-01-29 DIAGNOSIS — L219 Seborrheic dermatitis, unspecified: Secondary | ICD-10-CM

## 2024-01-29 DIAGNOSIS — L729 Follicular cyst of the skin and subcutaneous tissue, unspecified: Secondary | ICD-10-CM | POA: Diagnosis not present

## 2024-01-29 DIAGNOSIS — L659 Nonscarring hair loss, unspecified: Secondary | ICD-10-CM | POA: Diagnosis not present

## 2024-01-29 MED ORDER — CLOBETASOL PROPIONATE 0.05 % EX SOLN: 1.0000 | CUTANEOUS | 9 refills | Status: AC | PRN

## 2024-01-29 MED ORDER — SAFETY SEAL MISCELLANEOUS MISC
1.0000 | Freq: Every morning | 10 refills | Status: AC
Start: 2024-01-29 — End: ?

## 2024-01-29 NOTE — Patient Instructions (Addendum)
 Date: Wed Jan 29 2024  Hello Paige Fritz ,  Thank you for visiting today. Here is a summary of the key instructions:  - Medications:   - Apply clobetasol  liquid drops to scalp once daily (twice daily if severely itchy)   - Use minoxidil finasteride compound in the mornings on your temples   - Use DHS zinc 2% shampoo on shampoo days  - Skin Care:   - Only apply the minoxidil finasteride compound to the scalp area   - Wash your hands after applying   - Do not oil your scalp, but you can oil your hair  - Follow-up:   - Return in 8 months for progress photos of your temples to check hair regrowth   - If you want surgical removal of the cyst, we can refer you to Dr. Corey to discuss the procedure  - Other Instructions:   - Let the zinc shampoo sit for 2 minutes, then mix with your regular shampoo and rinse out   - Follow with conditioner   - After using the zinc shampoo for about 2 months, you won't need the drops as much   - Turn down the heat on your flat iron    - Avoid pulling your hair back too tightly or wearing tight braids   - Continue minoxidil finasteride treatment for 6 to 8 months   - [facility] compounding pharmacy will call you to verify your address and mail monthly supplies  Please reach out if you have any questions or concerns.  Warm regards,  Dr. Delon Lenis   Dermatology   Your provider has sent your prescription to [facility] in [place], [place] . A pharmacy representative will call you to confirm details and take your payment information. If you do not receive a call within 24 hours, please contact the pharmacy at [phone] or [phone]. Your unique skincare compound is being formulated in our lab (most compounds take less than 24 hours). Your prescription is shipped vis USPS to your mailbox (2-4 business days). Priority shipping is available at an additional cost. Once received, you will electronically sign/acknowledge that you received your  prescription. The pharmacy hours are Monday-Friday 9 am-6 pm EST and Saturday 9 am-1 pm EST.          Important Information   Due to recent changes in healthcare laws, you may see results of your pathology and/or laboratory studies on MyChart before the doctors have had a chance to review them. We understand that in some cases there may be results that are confusing or concerning to you. Please understand that not all results are received at the same time and often the doctors may need to interpret multiple results in order to provide you with the best plan of care or course of treatment. Therefore, we ask that you please give us  2 business days to thoroughly review all your results before contacting the office for clarification. Should we see a critical lab result, you will be contacted sooner.     If You Need Anything After Your Visit   If you have any questions or concerns for your doctor, please call our main line at [phone]. If no one answers, please leave a voicemail as directed and we will return your call as soon as possible. Messages left after 4 pm will be answered the following business day.    You may also send us  a message via MyChart. We typically respond to MyChart messages within 1-2 business days.  For prescription refills,  please ask your pharmacy to contact our office. Our fax number is [phone].  If you have an urgent issue when the clinic is closed that cannot wait until the next business day, you can page your doctor at the number below.     Please note that while we do our best to be available for urgent issues outside of office hours, we are not available 24/7.    If you have an urgent issue and are unable to reach us , you may choose to seek medical care at your doctor's office, retail clinic, urgent care center, or emergency room.   If you have a medical emergency, please immediately call 911 or go to the emergency department. In the event of inclement  weather, please call our main line at [phone] for an update on the status of any delays or closures.  Dermatology Medication Tips: Please keep the boxes that topical medications come in in order to help keep track of the instructions about where and how to use these. Pharmacies typically print the medication instructions only on the boxes and not directly on the medication tubes.   If your medication is too expensive, please contact our office at [phone] or send us  a message through MyChart.    We are unable to tell what your co-pay for medications will be in advance as this is different depending on your insurance coverage. However, we may be able to find a substitute medication at lower cost or fill out paperwork to get insurance to cover a needed medication.    If a prior authorization is required to get your medication covered by your insurance company, please allow us  1-2 business days to complete this process.   Drug prices often vary depending on where the prescription is filled and some pharmacies may offer cheaper prices.   The website www.goodrx.com contains coupons for medications through different pharmacies. The prices here do not account for what the cost may be with help from insurance (it may be cheaper with your insurance), but the website can give you the price if you did not use any insurance.  - You can print the associated coupon and take it with your prescription to the pharmacy.  - You may also stop by our office during regular business hours and pick up a GoodRx coupon card.  - If you need your prescription sent electronically to a different pharmacy, notify our office through [facility] or by phone at [phone]

## 2024-01-29 NOTE — Progress Notes (Signed)
   Follow-Up Visit   Subjective  Paige Fritz is a 33 y.o. female who presents for the following: sebaceous cyst and sebderm  Patient present today for follow up visit for an inflamed subaceous cyst on left upper back. Patient was last evaluated on 07/302025. At this visit patient was given a Kenalog  injection (0.41ml) . Patient reports sxs are unchanged. Patient reports that there is no irritation or pain but there is still drainage and a smell. Patient denies medication changes.  The following portions of the chart were reviewed this encounter and updated as appropriate: medications, allergies, medical history  Review of Systems:  No other skin or systemic complaints except as noted in HPI or Assessment and Plan.  Objective  Well appearing patient in no apparent distress; mood and affect are within normal limits.  A focused examination was performed of the following areas: Left upper back  Relevant exam findings are noted in the Assessment and Plan.                    Assessment & Plan    1. Epidermal Inclusion Cyst - Assessment: 2-centimeter non-tender subcutaneous cystic nodule with central punctum on left upper back consistent with epidermal inclusion cyst. Previous intralesional injection in July resulted in flattening but incomplete resolution. Cyst is benign and not dangerous. - Plan:    Discuss treatment options with patient: observation versus surgical excision    Offer referral to Dr. Corey for surgical excision consultation  2. Seborrheic Dermatitis - Assessment: Chronic recurrent condition affecting scalp with itching and flaking. Symptoms flare during cooler weather. Condition attributed to yeast overgrowth on skin and scalp. - Plan:    Prescribe DHS zinc shampoo 2% - apply to scalp on shampoo days, leave for 2 minutes, then mix with regular shampoo and rinse    Prescribe clobetasol  solution - apply to scalp once daily, increase to twice daily if  severely itchy    Advise against oiling scalp but oil can be applied to hair    Provide 6 refills for clobetasol  solution  3. Hair Thinning - Assessment: Patient reports concerns about hair thinning. Contributing factors may include excessive flat iron  use, tension from hairstyles, and possible genetic predisposition. Seborrheic dermatitis not typically associated with hair thinning. - Plan:    Prescribe compounded minoxidil-finasteride solution to be dispensed by [facility] compounding pharmacy    Apply to temples in the morning with proper hand washing after use    Recommend reducing heat when using flat iron  and minimizing hair tension    Plan to take photographs of temples to monitor progress  Follow-up in 2 months to reassess clobetasol  solution need and in 6-8 months for hair thinning reassessment.        SEBORRHEIC DERMATITIS   Related Medications clobetasol  (TEMOVATE ) 0.05 % external solution Apply 1 Application topically as needed. Apply to scalp as needed for itching HAIR THINNING   Related Medications Safety Seal Miscellaneous MISC Apply 1 Application topically in the morning. Medication name: Hormonic Hair Solution (Minoxidil 7% and Finasteride 0.05%)  Return in about 8 months (around 09/27/2024) for hair thinning, Seborrheic Dermatitis F/U.  LILLETTE Lyle Cords, am acting as Neurosurgeon for [employer], DO.   Documentation: I have reviewed the above documentation for accuracy and completeness, and I agree with the above.  Delon Lenis, DO

## 2024-02-05 ENCOUNTER — Ambulatory Visit (INDEPENDENT_AMBULATORY_CARE_PROVIDER_SITE_OTHER): Admitting: Family Medicine

## 2024-02-05 ENCOUNTER — Encounter (INDEPENDENT_AMBULATORY_CARE_PROVIDER_SITE_OTHER): Payer: Self-pay | Admitting: Family Medicine

## 2024-02-05 VITALS — BP 112/76 | HR 94 | Temp 98.2°F | Ht 61.0 in | Wt 171.0 lb

## 2024-02-05 DIAGNOSIS — F5089 Other specified eating disorder: Secondary | ICD-10-CM | POA: Diagnosis not present

## 2024-02-05 DIAGNOSIS — E559 Vitamin D deficiency, unspecified: Secondary | ICD-10-CM | POA: Diagnosis not present

## 2024-02-05 DIAGNOSIS — E669 Obesity, unspecified: Secondary | ICD-10-CM

## 2024-02-05 DIAGNOSIS — Z6832 Body mass index (BMI) 32.0-32.9, adult: Secondary | ICD-10-CM | POA: Diagnosis not present

## 2024-02-05 DIAGNOSIS — Z6836 Body mass index (BMI) 36.0-36.9, adult: Secondary | ICD-10-CM

## 2024-02-05 MED ORDER — TOPIRAMATE 50 MG PO TABS: 75.0000 mg | ORAL_TABLET | Freq: Every day | ORAL | 0 refills | Status: DC

## 2024-02-05 MED ORDER — VITAMIN D (ERGOCALCIFEROL) 1.25 MG (50000 UNIT) PO CAPS: 50000.0000 [IU] | ORAL_CAPSULE | ORAL | 0 refills | Status: AC

## 2024-02-05 MED ORDER — LOMAIRA 8 MG PO TABS: 12.0000 mg | ORAL_TABLET | Freq: Every day | ORAL | 0 refills | Status: DC

## 2024-02-05 NOTE — Assessment & Plan Note (Signed)
 Patient started on combination phentermine  and topiramate  in May 2024 at 213lbs.  She has achieved 5% weight loss goal as patient is now at 171lbs.  PDMP checked with no concerns for early refills or polypharmacy.  Sending in 2 months worth of prescriptions at this time as patient has 1 month from prior appointment of lomaira  pending and 1 additional month is being sent in.

## 2024-02-05 NOTE — Assessment & Plan Note (Signed)
 Last vitamin D  lab below goal.  Needs a refill of vitamin D  today.  Will continue prescription strength vitamin D  with no nausea, vomiting, or muscle weakness reported.

## 2024-02-05 NOTE — Progress Notes (Signed)
 SUBJECTIVE:  Chief Complaint: Obesity  Interim History: Patient voices that she has been a bit overwhelmed and stressed with work and school and life.  She is having to really focus on school and trying to figure out how to get a different position to allow her to work 3 twelve hour shifts instead of 4 eight hour shifts.  She is looking at possibly pursuing therapy with a therapist outside [facility]. She is getting a nutritious meal daily and is focusing on protein and nutrition.  She is snacking during the day or getting take out and is focusing on getting healthy foods in.   Paige Fritz is here to discuss her progress with her obesity treatment plan. She is on the keeping a food journal and adhering to recommended goals of 1100-1200 calories and 85 grams of protein and states she is following her eating plan approximately 60 % of the time. She states she is exercising 30-45  minutes 4 times per week.   OBJECTIVE: Visit Diagnoses: Problem List Items Addressed This Visit       Other   Disorder of eating - Primary   Relevant Medications   topiramate  (TOPAMAX ) 50 MG tablet   Phentermine  HCl (LOMAIRA ) 8 MG TABS   Vitamin D  deficiency   Relevant Medications   Vitamin D , Ergocalciferol , (DRISDOL ) 1.25 MG (50000 UNIT) CAPS capsule   Other Visit Diagnoses       Obesity with starting BMI of 36.3       Relevant Medications   Phentermine  HCl (LOMAIRA ) 8 MG TABS     BMI 32.0-32.9,adult           Vitals Temp: 98.2 F (36.8 C) BP: 112/76 Pulse Rate: 94 SpO2: 100 %   Anthropometric Measurements Height: 5' 1 (1.549 m) Weight: 171 lb (77.6 kg) BMI (Calculated): 32.33 Weight at Last Visit: 173 lb Weight Lost Since Last Visit: 2 Weight Gained Since Last Visit: 0 Starting Weight: 192 lb Total Weight Loss (lbs): 21 lb (9.526 kg)   Body Composition  Body Fat %: 38 % Fat Mass (lbs): 65.2 lbs Muscle Mass (lbs): 101 lbs Total Body Water  (lbs): 74.2 lbs Visceral Fat Rating : 7   Other  Clinical Data Today's Visit #: 21 Starting Date: 06/21/21 Comments: 1100-1200/85     ASSESSMENT AND PLAN: Assessment & Plan Other disorder of eating Patient started on combination phentermine  and topiramate  in May 2024 at 213lbs.  She has achieved 5% weight loss goal as patient is now at 171lbs.  PDMP checked with no concerns for early refills or polypharmacy.  Sending in 2 months worth of prescriptions at this time as patient has 1 month from prior appointment of lomaira  pending and 1 additional month is being sent in. Vitamin D  deficiency Last vitamin D  lab below goal.  Needs a refill of vitamin D  today.  Will continue prescription strength vitamin D  with no nausea, vomiting, or muscle weakness reported. Obesity with starting BMI of 36.3  BMI 32.0-32.9,adult    Diet: Paige Fritz is currently in the action stage of change. As such, her goal is to continue with weight loss efforts and has agreed to keeping a food journal and adhering to recommended goals of 1100-1200 calories and 85 or more grams of protein.   Exercise:  All adults should avoid inactivity. Some activity is better than none, and adults who participate in any amount of physical activity, gain some health benefits.  Behavior Modification:  We discussed the following Behavioral Modification Strategies today: increasing  lean protein intake, decreasing simple carbohydrates, increasing vegetables, meal planning and cooking strategies, planning for success, and keep a strict food journal. We discussed various medication options to help Paige Fritz with her weight loss efforts and we both agreed to continue current dose of phentermine  and topiramate .  PDMP was checked with no concerns.  Refill sent into pharmacy..  Return in about 8 weeks (around 04/01/2024).   She was informed of the importance of frequent follow up visits to maximize her success with intensive lifestyle modifications for her multiple health conditions.  Attestation  Statements:   Reviewed by clinician on day of visit: allergies, medications, problem list, medical history, surgical history, family history, social history, and previous encounter notes.     Paige Cho, MD

## 2024-02-15 DIAGNOSIS — F418 Other specified anxiety disorders: Secondary | ICD-10-CM | POA: Diagnosis not present

## 2024-02-27 DIAGNOSIS — F418 Other specified anxiety disorders: Secondary | ICD-10-CM | POA: Diagnosis not present

## 2024-03-04 ENCOUNTER — Other Ambulatory Visit: Payer: Self-pay | Admitting: Medical Genetics

## 2024-03-04 DIAGNOSIS — Z006 Encounter for examination for normal comparison and control in clinical research program: Secondary | ICD-10-CM

## 2024-03-20 ENCOUNTER — Inpatient Hospital Stay: Attending: Physician Assistant

## 2024-03-26 ENCOUNTER — Inpatient Hospital Stay: Attending: Physician Assistant

## 2024-03-26 DIAGNOSIS — F418 Other specified anxiety disorders: Secondary | ICD-10-CM | POA: Diagnosis not present

## 2024-03-30 DIAGNOSIS — F418 Other specified anxiety disorders: Secondary | ICD-10-CM | POA: Diagnosis not present

## 2024-03-31 ENCOUNTER — Inpatient Hospital Stay: Attending: Physician Assistant

## 2024-03-31 ENCOUNTER — Other Ambulatory Visit: Payer: Self-pay

## 2024-03-31 DIAGNOSIS — D509 Iron deficiency anemia, unspecified: Secondary | ICD-10-CM | POA: Insufficient documentation

## 2024-03-31 DIAGNOSIS — D500 Iron deficiency anemia secondary to blood loss (chronic): Secondary | ICD-10-CM

## 2024-03-31 DIAGNOSIS — Z79899 Other long term (current) drug therapy: Secondary | ICD-10-CM | POA: Insufficient documentation

## 2024-03-31 LAB — FERRITIN: Ferritin: 39 ng/mL (ref 11–307)

## 2024-03-31 LAB — IRON AND IRON BINDING CAPACITY (CC-WL,HP ONLY)
Iron: 49 ug/dL (ref 28–170)
Saturation Ratios: 15 % (ref 10.4–31.8)
TIBC: 329 ug/dL (ref 250–450)
UIBC: 280 ug/dL

## 2024-03-31 LAB — CBC WITH DIFFERENTIAL (CANCER CENTER ONLY)
Abs Immature Granulocytes: 0.01 K/uL (ref 0.00–0.07)
Basophils Absolute: 0 K/uL (ref 0.0–0.1)
Basophils Relative: 1 %
Eosinophils Absolute: 0.1 K/uL (ref 0.0–0.5)
Eosinophils Relative: 1 %
HCT: 35.4 % — ABNORMAL LOW (ref 36.0–46.0)
Hemoglobin: 12.4 g/dL (ref 12.0–15.0)
Immature Granulocytes: 0 %
Lymphocytes Relative: 43 %
Lymphs Abs: 2.2 K/uL (ref 0.7–4.0)
MCH: 31.5 pg (ref 26.0–34.0)
MCHC: 35 g/dL (ref 30.0–36.0)
MCV: 89.8 fL (ref 80.0–100.0)
Monocytes Absolute: 0.4 K/uL (ref 0.1–1.0)
Monocytes Relative: 7 %
Neutro Abs: 2.5 K/uL (ref 1.7–7.7)
Neutrophils Relative %: 48 %
Platelet Count: 346 K/uL (ref 150–400)
RBC: 3.94 MIL/uL (ref 3.87–5.11)
RDW: 13.1 % (ref 11.5–15.5)
WBC Count: 5.1 K/uL (ref 4.0–10.5)
nRBC: 0 % (ref 0.0–0.2)

## 2024-04-01 ENCOUNTER — Ambulatory Visit: Payer: Self-pay | Admitting: Physician Assistant

## 2024-04-06 ENCOUNTER — Ambulatory Visit (INDEPENDENT_AMBULATORY_CARE_PROVIDER_SITE_OTHER): Payer: Self-pay | Admitting: Family Medicine

## 2024-04-06 ENCOUNTER — Encounter (INDEPENDENT_AMBULATORY_CARE_PROVIDER_SITE_OTHER): Payer: Self-pay | Admitting: Family Medicine

## 2024-04-06 VITALS — BP 108/69 | HR 101 | Temp 98.1°F | Ht 61.0 in | Wt 171.0 lb

## 2024-04-06 DIAGNOSIS — D500 Iron deficiency anemia secondary to blood loss (chronic): Secondary | ICD-10-CM | POA: Diagnosis not present

## 2024-04-06 DIAGNOSIS — Z6832 Body mass index (BMI) 32.0-32.9, adult: Secondary | ICD-10-CM

## 2024-04-06 DIAGNOSIS — F5089 Other specified eating disorder: Secondary | ICD-10-CM | POA: Diagnosis not present

## 2024-04-06 DIAGNOSIS — E66812 Obesity, class 2: Secondary | ICD-10-CM | POA: Diagnosis not present

## 2024-04-06 MED ORDER — TOPIRAMATE 50 MG PO TABS: 50.0000 mg | ORAL_TABLET | Freq: Every day | ORAL | 0 refills | Status: DC

## 2024-04-06 MED ORDER — PHENTERMINE HCL 8 MG PO TABS: 8.0000 mg | ORAL_TABLET | Freq: Every day | ORAL | 0 refills | Status: DC

## 2024-04-06 NOTE — Progress Notes (Unsigned)
 SUBJECTIVE:  Chief Complaint: Obesity  Interim History: Patient here for routine follow up.  Patient has been particularly stressed sinced last appointment- work, school, mom fell and broke her ankle, grandmother died.  Patient found a therapist and is on her 3 or 4th session currently.  Her mother's ankle hardware got infected and she needed a follow up surgery.  She has now gotten FMLA to help with her mother.  She is trying to get switched to night shift.    Paige Fritz is here to discuss her progress with her obesity treatment plan. She is on the keeping a food journal and adhering to recommended goals of 1100-1200 calories and 85 grams of protein and states she is following her eating plan approximately 50 % of the time. She states she is exercising 45 minutes 4 times per week.   OBJECTIVE: Visit Diagnoses: Problem List Items Addressed This Visit       Other   Disorder of eating   Relevant Medications   Phentermine  HCl (LOMAIRA ) 8 MG TABS   topiramate  (TOPAMAX ) 50 MG tablet   Vitamin D  deficiency    Vitals Temp: 98.1 F (36.7 C) BP: 108/69 Pulse Rate: (!) 101 SpO2: 100 %   Anthropometric Measurements Height: 5' 1 (1.549 m) Weight: 171 lb (77.6 kg) BMI (Calculated): 32.33 Weight at Last Visit: 171 lb Weight Lost Since Last Visit: 0 Weight Gained Since Last Visit: 0 Starting Weight: 192 lb Total Weight Loss (lbs): 21 lb (9.526 kg)   Body Composition  Body Fat %: 38.4 % Fat Mass (lbs): 65.8 lbs Muscle Mass (lbs): 100 lbs Total Body Water  (lbs): 74.2 lbs Visceral Fat Rating : 7   Other Clinical Data Today's Visit #: 22 Starting Date: 06/21/21 Comments: 1100-1200/85     ASSESSMENT AND PLAN: Assessment & Plan Other disorder of eating Patient started combination of phentermine  and topiramate  at 213 pounds in May 2024.  She has achieved 5% weight loss and her current weight is 171 pounds.  She is interested in seeing  if she can taper off these medications and so she will decrease to 8 mg Lomaira  and 50 mg of topiramate  for 2 weeks and then further decrease to 4 mg of Lomaira  and 25 mg of topiramate  for 2 weeks and we will follow-up in 1 month to see how patient has tolerated this taper.  Refill of prescription sent to pharmacy.  PDMP was checked with no concerns.  Have refilled patient's prescription for an entire month as if patient is noticing weight regain she may want to discontinue taper. Iron  deficiency anemia due to chronic blood loss Patient mentions her iron  level has stayed the same but her ferritin has decreased substantially.  Her period was particularly intense the last time with more bleeding and cramping.  Patient is wondering ultimately what the etiology of this change in bleeding is.  We discussed that menstrual bleeding and cycle length can change with stress.  Will follow-up at next appointment.  Patient to continue oral iron  supplementation at this time. BMI 32.0-32.9,adult  Obesity with starting BMI of 36.3    Diet: [name] is currently in the action stage of change. As such, her goal is to continue with weight loss efforts and has agreed to keeping a food  journal and adhering to recommended goals of 1100-1200 calories and 80 or more grams of protein.   Exercise:  All adults should avoid inactivity. Some activity is better than none, and adults who participate in any amount of physical activity, gain some health benefits.  Behavior Modification:  We discussed the following Behavioral Modification Strategies today: increasing lean protein intake, decreasing simple carbohydrates, increasing vegetables, meal planning and cooking strategies, planning for success, and keep a strict food journal. We discussed various medication options to help Paige Fritz with her weight loss efforts and we both agreed to continue phentermine  and topiramate  at current dose.  Return in about 6 weeks (around  05/18/2024).   She was informed of the importance of frequent follow up visits to maximize her success with intensive lifestyle modifications for her multiple health conditions.  Attestation Statements:   Reviewed by clinician on day of visit: allergies, medications, problem list, medical history, surgical history, family history, social history, and previous encounter notes.   [name], MD

## 2024-04-06 NOTE — Assessment & Plan Note (Signed)
 Patient mentions her iron  level has stayed the same but her ferritin has decreased substantially.  Her period was particularly intense the last time with more bleeding and cramping.

## 2024-04-07 NOTE — Assessment & Plan Note (Signed)
 Patient started combination of phentermine  and topiramate  at 213 pounds in May 2024.  She has achieved 5% weight loss and her current weight is 171 pounds.  She is interested in seeing if she can taper off these medications and so she will decrease to 8 mg Lomaira  and 50 mg of topiramate  for 2 weeks and then further decrease to 4 mg of Lomaira  and 25 mg of topiramate  for 2 weeks and we will follow-up in 1 month to see how patient has tolerated this taper.  Refill of prescription sent to pharmacy.  PDMP was checked with no concerns.  Have refilled patient's prescription for an entire month as if patient is noticing weight regain she may want to discontinue taper.

## 2024-04-11 DIAGNOSIS — F418 Other specified anxiety disorders: Secondary | ICD-10-CM | POA: Diagnosis not present

## 2024-04-23 ENCOUNTER — Other Ambulatory Visit: Payer: Self-pay | Admitting: Physician Assistant

## 2024-04-25 ENCOUNTER — Inpatient Hospital Stay: Attending: Physician Assistant

## 2024-04-25 VITALS — BP 117/67 | HR 96 | Temp 97.2°F | Resp 18

## 2024-04-25 DIAGNOSIS — D509 Iron deficiency anemia, unspecified: Secondary | ICD-10-CM | POA: Diagnosis present

## 2024-04-25 DIAGNOSIS — D500 Iron deficiency anemia secondary to blood loss (chronic): Secondary | ICD-10-CM

## 2024-04-25 MED ORDER — IRON SUCROSE 20 MG/ML IV SOLN
200.0000 mg | Freq: Once | INTRAVENOUS | Status: AC
  Administered 2024-04-25: 200 mg via INTRAVENOUS
  Filled 2024-04-25: qty 10

## 2024-04-25 MED ORDER — SODIUM CHLORIDE 0.9 % IV SOLN: INTRAVENOUS | Status: DC

## 2024-04-25 NOTE — Patient Instructions (Signed)

## 2024-04-27 ENCOUNTER — Ambulatory Visit: Admitting: Dermatology

## 2024-04-27 ENCOUNTER — Telehealth: Payer: Self-pay | Admitting: Plastic Surgery

## 2024-04-27 NOTE — Telephone Encounter (Signed)
 Pt is sched 04-28-24 for a f/u, pt just called and asked is Dr Lowery can order and US , if she can she will cancel tomorrows apt until she can get the US  done

## 2024-04-28 ENCOUNTER — Other Ambulatory Visit: Payer: Self-pay | Admitting: Plastic Surgery

## 2024-04-28 ENCOUNTER — Ambulatory Visit: Admitting: Plastic Surgery

## 2024-04-28 DIAGNOSIS — N62 Hypertrophy of breast: Secondary | ICD-10-CM

## 2024-04-28 NOTE — Progress Notes (Signed)
 The patient is a 33 year old female joining me by phone for further discussion about her left breast.  She is interested in doing the ultrasound because she is worried about the increase in tenderness and discomfort.  I think that is perfectly reasonable.  If the ultrasound is negative for anything concerning and it is consistent with fat necrosis then surgery is an option.  The patient was at home and I was at the office we spent less than 5 minutes in discussion.

## 2024-04-29 DIAGNOSIS — F418 Other specified anxiety disorders: Secondary | ICD-10-CM | POA: Diagnosis not present

## 2024-05-02 ENCOUNTER — Inpatient Hospital Stay

## 2024-05-09 ENCOUNTER — Inpatient Hospital Stay

## 2024-05-12 ENCOUNTER — Telehealth: Payer: Self-pay | Admitting: Physician Assistant

## 2024-05-12 NOTE — Telephone Encounter (Signed)
 I left the pt a message regarding rescheduling 2/13 teleph visit, patient has been made aware of new appt date and time.

## 2024-05-15 ENCOUNTER — Other Ambulatory Visit (INDEPENDENT_AMBULATORY_CARE_PROVIDER_SITE_OTHER): Payer: Self-pay | Admitting: Family Medicine

## 2024-05-16 ENCOUNTER — Inpatient Hospital Stay: Attending: Physician Assistant

## 2024-05-16 VITALS — BP 121/71 | HR 98 | Temp 97.7°F | Resp 18

## 2024-05-16 DIAGNOSIS — D500 Iron deficiency anemia secondary to blood loss (chronic): Secondary | ICD-10-CM

## 2024-05-16 DIAGNOSIS — D509 Iron deficiency anemia, unspecified: Secondary | ICD-10-CM | POA: Diagnosis present

## 2024-05-16 DIAGNOSIS — Z79899 Other long term (current) drug therapy: Secondary | ICD-10-CM | POA: Insufficient documentation

## 2024-05-16 MED ORDER — IRON SUCROSE 20 MG/ML IV SOLN
200.0000 mg | Freq: Once | INTRAVENOUS | Status: AC
  Administered 2024-05-16: 200 mg via INTRAVENOUS
  Filled 2024-05-16: qty 10

## 2024-05-16 MED ORDER — SODIUM CHLORIDE 0.9 % IV SOLN: INTRAVENOUS | Status: DC

## 2024-05-16 NOTE — Progress Notes (Signed)
 Patient declined to stay for 30 minute post-observation period following Venofer  infusion.  Patient educated on importance of monitoring period and s/s of adverse reactions. Patient's vital signs retaken prior to discharge and remained within normal parameters.  Patient discharged in stable condition.

## 2024-05-16 NOTE — Patient Instructions (Signed)

## 2024-05-18 ENCOUNTER — Other Ambulatory Visit: Payer: Self-pay | Admitting: Plastic Surgery

## 2024-05-18 DIAGNOSIS — N62 Hypertrophy of breast: Secondary | ICD-10-CM

## 2024-05-19 ENCOUNTER — Ambulatory Visit (INDEPENDENT_AMBULATORY_CARE_PROVIDER_SITE_OTHER): Admitting: Family Medicine

## 2024-05-21 ENCOUNTER — Encounter (INDEPENDENT_AMBULATORY_CARE_PROVIDER_SITE_OTHER): Payer: Self-pay

## 2024-05-21 ENCOUNTER — Inpatient Hospital Stay: Admission: RE | Admit: 2024-05-21 | Discharge: 2024-05-21 | Attending: Plastic Surgery | Admitting: Plastic Surgery

## 2024-05-21 ENCOUNTER — Ambulatory Visit (INDEPENDENT_AMBULATORY_CARE_PROVIDER_SITE_OTHER): Admitting: Family Medicine

## 2024-05-21 DIAGNOSIS — N62 Hypertrophy of breast: Secondary | ICD-10-CM

## 2024-05-22 ENCOUNTER — Other Ambulatory Visit: Payer: Self-pay | Admitting: Physician Assistant

## 2024-05-23 ENCOUNTER — Inpatient Hospital Stay

## 2024-05-23 VITALS — BP 118/70 | HR 89 | Temp 98.0°F | Resp 18

## 2024-05-23 DIAGNOSIS — D500 Iron deficiency anemia secondary to blood loss (chronic): Secondary | ICD-10-CM

## 2024-05-23 DIAGNOSIS — D509 Iron deficiency anemia, unspecified: Secondary | ICD-10-CM | POA: Diagnosis not present

## 2024-05-23 MED ORDER — IRON SUCROSE 20 MG/ML IV SOLN
200.0000 mg | Freq: Once | INTRAVENOUS | Status: AC
  Administered 2024-05-23: 200 mg via INTRAVENOUS
  Filled 2024-05-23: qty 10

## 2024-05-23 MED ORDER — FAMOTIDINE IN NACL 20-0.9 MG/50ML-% IV SOLN
20.0000 mg | Freq: Once | INTRAVENOUS | Status: AC
  Administered 2024-05-23: 20 mg via INTRAVENOUS
  Filled 2024-05-23: qty 50

## 2024-05-23 MED ORDER — SODIUM CHLORIDE 0.9 % IV SOLN: INTRAVENOUS | Status: DC

## 2024-05-25 ENCOUNTER — Telehealth: Payer: Self-pay | Admitting: Plastic Surgery

## 2024-05-25 NOTE — Telephone Encounter (Signed)
 Pt called wanting to confirm that Dr. Lowery has rcvd US  results from last week, she also requested to know what the plan was moving forward.  Please advise.

## 2024-05-26 ENCOUNTER — Ambulatory Visit: Admitting: Plastic Surgery

## 2024-05-26 DIAGNOSIS — N6489 Other specified disorders of breast: Secondary | ICD-10-CM | POA: Diagnosis not present

## 2024-05-26 DIAGNOSIS — N62 Hypertrophy of breast: Secondary | ICD-10-CM

## 2024-05-26 NOTE — Progress Notes (Signed)
" ° °  Subjective:    Patient ID: Paige Fritz, female    DOB: [birth year], 34 y.o.   MRN: [id number]  The patient is a 34 year old female joining me by phone for discussion about her breast.  She was first seen in April 2024 and underwent bilateral breast reduction in May 2025.  She had over 1100 g removed from both breasts.  She noticed some firmness and tenderness on the left lateral breast so requested some imaging.  The ultrasound was done January 15.  The ultrasound showed a fluid collection in the lower left breast no other concerns noted.      Review of Systems  Constitutional: Negative.   HENT: Negative.    Eyes: Negative.   Respiratory: Negative.    Cardiovascular: Negative.   Gastrointestinal: Negative.   Genitourinary: Negative.        Objective:   Physical Exam      Assessment & Plan:     ICD-10-CM   1. Symptomatic mammary hypertrophy  N62       I connected with  Paige Fritz on 05/26/24 phone and verified that I am speaking with the correct person using two identifiers.  The patient most likely would like to go to the OR for excision of what is probably a capsule collecting fluid that is creating firmness may also be a combination of fat necrosis.  She agreed to come back in and lets reevaluate her and talk about the possibility of revision.  We spent 5 minutes in discussion.  The patient was at home and I was at the office.   I discussed the limitations of evaluation and management by telemedicine. The patient expressed understanding and agreed to proceed.  "

## 2024-06-09 ENCOUNTER — Encounter: Payer: Self-pay | Admitting: Plastic Surgery

## 2024-06-09 ENCOUNTER — Ambulatory Visit: Admitting: Plastic Surgery

## 2024-06-09 VITALS — BP 102/67 | HR 87

## 2024-06-09 DIAGNOSIS — N6489 Other specified disorders of breast: Secondary | ICD-10-CM

## 2024-06-09 DIAGNOSIS — N62 Hypertrophy of breast: Secondary | ICD-10-CM

## 2024-06-09 DIAGNOSIS — M7989 Other specified soft tissue disorders: Secondary | ICD-10-CM | POA: Insufficient documentation

## 2024-06-09 DIAGNOSIS — N641 Fat necrosis of breast: Secondary | ICD-10-CM | POA: Diagnosis not present

## 2024-06-09 NOTE — Progress Notes (Signed)
" ° °  Subjective:    Patient ID: Paige Fritz, female    DOB: [birth year], 34 y.o.   MRN: [id number]  The patient is a 34 year old female here for follow-up on her breast reduction.  Her reduction was in May 2025.  She had some concerns about tenderness in the left breast on the lateral aspect.  She does have some fat necrosis and a seroma.  She had a recent mammogram and ultrasound which showed the area.  She is interested in having the fat necrosis removed because of pain and discomfort.  She was also told that she had dense breasts and that made her worried because a recent aunt was diagnosed with breast cancer.  This would not be a reason to do an additional breast reduction but it is certainly an option to do a reduction if she loses more weight.      Review of Systems  Constitutional: Negative.   HENT: Negative.    Eyes: Negative.   Respiratory: Negative.    Cardiovascular: Negative.   Gastrointestinal: Negative.   Endocrine: Negative.   Genitourinary: Negative.        Objective:   Physical Exam Constitutional:      Appearance: Normal appearance.  HENT:     Head: Atraumatic.  Cardiovascular:     Rate and Rhythm: Normal rate.     Pulses: Normal pulses.  Pulmonary:     Effort: Pulmonary effort is normal.  Skin:    General: Skin is warm.     Capillary Refill: Capillary refill takes less than 2 seconds.     Coloration: Skin is not jaundiced.     Findings: Lesion present. No bruising.  Neurological:     Mental Status: She is alert and oriented to person, place, and time.  Psychiatric:        Mood and Affect: Mood normal.        Behavior: Behavior normal.        Thought Content: Thought content normal.        Judgment: Judgment normal.           Assessment & Plan:     ICD-10-CM   1. Symptomatic mammary hypertrophy  N62     2. 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  The patient would like to move forward with that.  She is also thinking about losing more weight and may be doing a repeat reduction so she is going to think about the timing with all of that.  "

## 2024-06-11 ENCOUNTER — Encounter (INDEPENDENT_AMBULATORY_CARE_PROVIDER_SITE_OTHER): Payer: Self-pay | Admitting: Family Medicine

## 2024-06-11 ENCOUNTER — Ambulatory Visit (INDEPENDENT_AMBULATORY_CARE_PROVIDER_SITE_OTHER): Admitting: Family Medicine

## 2024-06-11 VITALS — BP 118/74 | HR 86 | Temp 98.4°F | Ht 61.0 in | Wt 176.0 lb

## 2024-06-11 DIAGNOSIS — F5089 Other specified eating disorder: Secondary | ICD-10-CM

## 2024-06-11 DIAGNOSIS — E559 Vitamin D deficiency, unspecified: Secondary | ICD-10-CM

## 2024-06-11 DIAGNOSIS — E66812 Obesity, class 2: Secondary | ICD-10-CM

## 2024-06-11 DIAGNOSIS — Z6833 Body mass index (BMI) 33.0-33.9, adult: Secondary | ICD-10-CM

## 2024-06-11 MED ORDER — TOPIRAMATE 50 MG PO TABS: 75.0000 mg | ORAL_TABLET | Freq: Every day | ORAL | 0 refills | Status: DC

## 2024-06-11 MED ORDER — PHENTERMINE HCL 8 MG PO TABS: 12.0000 mg | ORAL_TABLET | Freq: Every day | ORAL | 0 refills | Status: AC

## 2024-06-11 MED ORDER — TOPIRAMATE 50 MG PO TABS: 75.0000 mg | ORAL_TABLET | Freq: Every day | ORAL | 0 refills | Status: AC

## 2024-06-11 NOTE — Progress Notes (Unsigned)
 "  SUBJECTIVE:  Chief Complaint: Obesity  Interim History: Patient here for first follow up since early December.  Some things are starting to fall into place- last day at cancer center tomorrow, she has found a new therapist.  She is starting to get more frequent headaches which coincided with decrease of her medications.  She was feeling more migraine type pain.  She also had ultrasound done of her breasts and was found to have fluid collection in left breast of 7.8cm x 3.7cm.  She has been craving more protein and eating mostly protein.  She got 3 iron  infusions and is feeling better overall.  Brietta is here to discuss her progress with her obesity treatment plan. She is on the keeping a food journal and adhering to recommended goals of 1100-1200 calories and 80 grams of protein and states she is following her eating plan approximately 60 % of the time. She states she is exercising 10-15 minutes 5 times per week.   OBJECTIVE: Visit Diagnoses: Problem List Items Addressed This Visit       Other   Disorder of eating - Primary   Relevant Medications   topiramate  (TOPAMAX ) 50 MG tablet   Phentermine  HCl (LOMAIRA ) 8 MG TABS   Vitamin D  deficiency   Other Visit Diagnoses       Obesity with starting BMI of 36.3       Relevant Medications   Phentermine  HCl (LOMAIRA ) 8 MG TABS     BMI 33.0-33.9,adult           Vitals Temp: 98.4 F (36.9 C) BP: 118/74 Pulse Rate: 86 SpO2: 100 %   Anthropometric Measurements Height: 5' 1 (1.549 m) Weight: 176 lb (79.8 kg) BMI (Calculated): 33.27 Weight at Last Visit: 171 lb Weight Lost Since Last Visit: 0 Weight Gained Since Last Visit: 5 Starting Weight: 192 lb Total Weight Loss (lbs): 16 lb (7.258 kg)   Body Composition  Body Fat %: 39.3 % Fat Mass (lbs): 69.4 lbs Muscle Mass (lbs): 102 lbs Total Body Water  (lbs): 74.2 lbs Visceral Fat Rating : 8   Other Clinical Data Today's Visit #: 23 Starting Date: 06/21/21 Comments:  1100-1200/80     ASSESSMENT AND PLAN: Assessment & Plan Other disorder of eating  Vitamin D  deficiency  Obesity with starting BMI of 36.3  BMI 33.0-33.9,adult    Diet: Sakia is currently in the action stage of change. As such, her goal is to continue with weight loss efforts and has agreed to keeping a food journal and adhering to recommended goals of 1100-1200 calories and 80 or more grams of protein daily.   Exercise:  For additional and more extensive health benefits, adults should increase their aerobic physical activity to 300 minutes (5 hours) a week of moderate-intensity, or 150 minutes a week of vigorous-intensity aerobic physical activity, or an equivalent combination of moderate- and vigorous-intensity activity. Additional health benefits are gained by engaging in physical activity beyond this amount.  and Adults should also include muscle-strengthening activities that involve all major muscle groups on 2 or more days a week.  Behavior Modification:  We discussed the following Behavioral Modification Strategies today: increasing lean protein intake, decreasing simple carbohydrates, increasing vegetables, meal planning and cooking strategies, keeping healthy foods in the home, planning for success, and keep a strict food journal. We discussed various medication options to help Secilia with her weight loss efforts and we both agreed to ***.  No follow-ups on file.   She was informed of  the importance of frequent follow up visits to maximize her success with intensive lifestyle modifications for her multiple health conditions.  Attestation Statements:   Reviewed by clinician on day of visit: allergies, medications, problem list, medical history, surgical history, family history, social history, and previous encounter notes.   Time spent on visit including pre-visit chart review and post-visit care and charting was *** minutes  [name], MD "

## 2024-06-18 ENCOUNTER — Inpatient Hospital Stay: Attending: Physician Assistant

## 2024-06-19 ENCOUNTER — Inpatient Hospital Stay: Admitting: Physician Assistant

## 2024-06-24 ENCOUNTER — Inpatient Hospital Stay: Admitting: Physician Assistant

## 2024-06-26 ENCOUNTER — Encounter: Payer: Commercial Managed Care - PPO | Admitting: Family Medicine

## 2024-07-07 ENCOUNTER — Ambulatory Visit: Admitting: Dermatology

## 2024-07-23 ENCOUNTER — Ambulatory Visit (INDEPENDENT_AMBULATORY_CARE_PROVIDER_SITE_OTHER): Admitting: Family Medicine

## 2024-09-29 ENCOUNTER — Ambulatory Visit: Admitting: Dermatology
